# Patient Record
Sex: Female | Born: 1990 | Race: White | Hispanic: No | Smoking: Current every day smoker
Health system: Southern US, Community
[De-identification: ages and names within clinical notes are randomized; demographics above are authoritative.]

## PROBLEM LIST (undated history)

## (undated) DIAGNOSIS — J45909 Unspecified asthma, uncomplicated: Secondary | ICD-10-CM

## (undated) DIAGNOSIS — F3132 Bipolar disorder, current episode depressed, moderate: Secondary | ICD-10-CM

## (undated) DIAGNOSIS — F319 Bipolar disorder, unspecified: Secondary | ICD-10-CM

## (undated) HISTORY — DX: Bipolar disorder, current episode depressed, moderate: F31.32

## (undated) HISTORY — PX: CHOLECYSTECTOMY: SHX55

## (undated) HISTORY — PX: TONSILLECTOMY: SUR1361

---
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
## 2011-09-28 ENCOUNTER — Other Ambulatory Visit (HOSPITAL_COMMUNITY)
Admission: RE | Admit: 2011-09-28 | Discharge: 2011-09-28 | Disposition: A | Discharge status: Home / Self Care | Payer: BC Managed Care – PPO | Source: Ambulatory Visit | Attending: Endocrinology | Admitting: Endocrinology

## 2011-09-28 ENCOUNTER — Other Ambulatory Visit: Payer: Self-pay | Admitting: Family Medicine

## 2011-09-28 DIAGNOSIS — Z0000 Encounter for general adult medical examination without abnormal findings: Secondary | ICD-10-CM | POA: Insufficient documentation

## 2011-09-28 DIAGNOSIS — Z113 Encounter for screening for infections with a predominantly sexual mode of transmission: Secondary | ICD-10-CM | POA: Insufficient documentation

## 2012-02-13 ENCOUNTER — Emergency Department (HOSPITAL_COMMUNITY)
Admission: EM | Admit: 2012-02-13 | Discharge: 2012-02-13 | Disposition: A | Discharge status: Home / Self Care | Payer: BC Managed Care – PPO | Attending: Emergency Medicine | Admitting: Emergency Medicine

## 2012-02-13 ENCOUNTER — Encounter (HOSPITAL_COMMUNITY): Payer: Self-pay | Admitting: *Deleted

## 2012-02-13 DIAGNOSIS — R220 Localized swelling, mass and lump, head: Secondary | ICD-10-CM | POA: Insufficient documentation

## 2012-02-13 DIAGNOSIS — R110 Nausea: Secondary | ICD-10-CM | POA: Insufficient documentation

## 2012-02-13 DIAGNOSIS — H9209 Otalgia, unspecified ear: Secondary | ICD-10-CM | POA: Insufficient documentation

## 2012-02-13 DIAGNOSIS — J45909 Unspecified asthma, uncomplicated: Secondary | ICD-10-CM | POA: Insufficient documentation

## 2012-02-13 DIAGNOSIS — R221 Localized swelling, mass and lump, neck: Secondary | ICD-10-CM | POA: Insufficient documentation

## 2012-02-13 DIAGNOSIS — K047 Periapical abscess without sinus: Secondary | ICD-10-CM | POA: Insufficient documentation

## 2012-02-13 DIAGNOSIS — F17200 Nicotine dependence, unspecified, uncomplicated: Secondary | ICD-10-CM | POA: Insufficient documentation

## 2012-02-13 DIAGNOSIS — K029 Dental caries, unspecified: Secondary | ICD-10-CM | POA: Insufficient documentation

## 2012-02-13 HISTORY — DX: Unspecified asthma, uncomplicated: J45.909

## 2012-02-13 MED ORDER — HYDROCODONE-ACETAMINOPHEN 5-325 MG PO TABS: 1.0000 | ORAL_TABLET | ORAL | Status: DC | PRN

## 2012-02-13 MED ORDER — CLINDAMYCIN HCL 150 MG PO CAPS: ORAL_CAPSULE | ORAL | Status: DC

## 2012-02-13 MED ORDER — ONDANSETRON HCL 8 MG PO TABS: 4.0000 mg | ORAL_TABLET | Freq: Once | ORAL | Status: DC

## 2012-02-13 MED ORDER — HYDROCODONE-ACETAMINOPHEN 5-325 MG PO TABS
1.0000 | ORAL_TABLET | Freq: Once | ORAL | Status: AC
  Administered 2012-02-13: 1 via ORAL
  Filled 2012-02-13: qty 1

## 2012-02-13 MED ORDER — ONDANSETRON 4 MG PO TBDP
ORAL_TABLET | ORAL | Status: AC
  Administered 2012-02-13: 4 mg
  Filled 2012-02-13: qty 1

## 2012-02-13 NOTE — ED Notes (Signed)
States she has a tooth that needs to be pulled and an infection.

## 2012-02-13 NOTE — ED Provider Notes (Signed)
Medical screening examination/treatment/procedure(s) were performed by non-physician practitioner and as supervising physician I was immediately available for consultation/collaboration.   Charles B. Sheldon, MD 02/13/12 1531 

## 2012-02-13 NOTE — ED Provider Notes (Signed)
History     CSN: [id number]  Arrival date & time 02/13/12  1039   First MD Initiated Contact with Patient 02/13/12 1109      Chief Complaint  Patient presents with  . Dental Pain    (Consider location/radiation/quality/duration/timing/severity/associated sxs/prior treatment) The history is provided by the patient.  April James is a 22 y.o. female who presents with dental pain and swelling.  She states that the tooth itself has been rotting and painful for months, but last night, the pain increased. She was awakened by a nasty taste in her throat and noticed swelling of her left jaw.  She states that her pain has intensified over the past 10 hours.  She denies fever, sinus pain, headache.  She states that her jaw and ear hurt to the touch.  She has not tried any remedies for the pain or swelling to date.  She states that she has known for a long time that the tooth needs to be pulled, but has no money to have this done.       Past Medical History  Diagnosis Date  . Asthma     Past Surgical History  Procedure Date  . Cholecystectomy   . Tonsillectomy     No family history on file.  History  Substance Use Topics  . Smoking status: Current Every Day Smoker  . Smokeless tobacco: Not on file  . Alcohol Use: Yes     Comment: occ    OB History    Grav Para Term Preterm Abortions TAB SAB Ect Mult Living                  Review of Systems  Constitutional: Negative for fever and chills.  HENT: Positive for ear pain, facial swelling and dental problem. Negative for congestion, sore throat, trouble swallowing, neck pain, neck stiffness, voice change and sinus pressure.   Respiratory: Negative.   Cardiovascular: Negative.   Gastrointestinal: Positive for nausea.  Neurological: Negative for headaches.    Allergies  Penicillins  Home Medications  No current outpatient prescriptions on file.  BP 130/57  Pulse 93  Temp 98.2 F (36.8 C) (Oral)  Resp 22  SpO2 95%   LMP 02/10/2012  Physical Exam  Constitutional: She is oriented to person, place, and time. She appears well-developed and well-nourished.  HENT:  Head: Normocephalic and atraumatic.  Right Ear: Tympanic membrane and external ear normal.  Left Ear: Tympanic membrane and external ear normal.  Mouth/Throat: Dental abscesses and dental caries present. No oropharyngeal exudate or tonsillar abscesses.         Edema along left mandible.  Tenderness of mandible,left external ear  Eyes: Pupils are equal, round, and reactive to light.  Neck: Normal range of motion. Neck supple.  Lymphadenopathy:    She has no cervical adenopathy.  Neurological: She is alert and oriented to person, place, and time.    ED Course  Procedures (including critical care time)  Labs Reviewed - No data to display No results found.   No diagnosis found. 1. Dental abscess 2. Multiple dental caries   MDM  Patient has swelling and tenderness of left jawline due to dental abscess.  The abscess has been draining overnight.  The patient is afebrile and understands that the tooth needs to be extracted.  Antibiotics and pain medicine given along with dental referral.  Patient acknowledges and agrees with this treatment plan.        Jacobo Moncrief, PA-C 02/13/12 1156

## 2012-02-13 NOTE — ED Notes (Signed)
Pt is here with Left lower tooth decay and thinks may have abscess developing.

## 2012-05-25 ENCOUNTER — Encounter (HOSPITAL_COMMUNITY): Payer: Self-pay | Admitting: Adult Health

## 2012-05-25 DIAGNOSIS — Z880 Allergy status to penicillin: Secondary | ICD-10-CM | POA: Insufficient documentation

## 2012-05-25 DIAGNOSIS — J45909 Unspecified asthma, uncomplicated: Secondary | ICD-10-CM | POA: Insufficient documentation

## 2012-05-25 DIAGNOSIS — N10 Acute tubulo-interstitial nephritis: Secondary | ICD-10-CM | POA: Insufficient documentation

## 2012-05-25 DIAGNOSIS — R109 Unspecified abdominal pain: Secondary | ICD-10-CM | POA: Insufficient documentation

## 2012-05-25 DIAGNOSIS — R509 Fever, unspecified: Secondary | ICD-10-CM | POA: Insufficient documentation

## 2012-05-25 DIAGNOSIS — F17200 Nicotine dependence, unspecified, uncomplicated: Secondary | ICD-10-CM | POA: Insufficient documentation

## 2012-05-25 DIAGNOSIS — Z79899 Other long term (current) drug therapy: Secondary | ICD-10-CM | POA: Insufficient documentation

## 2012-05-25 DIAGNOSIS — Z3202 Encounter for pregnancy test, result negative: Secondary | ICD-10-CM | POA: Insufficient documentation

## 2012-05-25 DIAGNOSIS — Z9089 Acquired absence of other organs: Secondary | ICD-10-CM | POA: Insufficient documentation

## 2012-05-25 LAB — URINALYSIS, ROUTINE W REFLEX MICROSCOPIC
Glucose, UA: NEGATIVE mg/dL
Ketones, ur: NEGATIVE mg/dL
Nitrite: NEGATIVE
Protein, ur: 30 mg/dL — AB
Urobilinogen, UA: 0.2 mg/dL (ref 0.0–1.0)

## 2012-05-25 LAB — CBC WITH DIFFERENTIAL/PLATELET
Basophils Absolute: 0 10*3/uL (ref 0.0–0.1)
Basophils Relative: 0 % (ref 0–1)
Eosinophils Relative: 1 % (ref 0–5)
Lymphocytes Relative: 8 % — ABNORMAL LOW (ref 12–46)
MCHC: 36.2 g/dL — ABNORMAL HIGH (ref 30.0–36.0)
MCV: 93.8 fL (ref 78.0–100.0)
Platelets: 310 10*3/uL (ref 150–400)
RDW: 11.5 % (ref 11.5–15.5)
WBC: 16.9 10*3/uL — ABNORMAL HIGH (ref 4.0–10.5)

## 2012-05-25 LAB — BASIC METABOLIC PANEL
CO2: 23 mEq/L (ref 19–32)
Calcium: 9.8 mg/dL (ref 8.4–10.5)
GFR calc Af Amer: >90 mL/min (ref >90)
GFR calc non Af Amer: >90 mL/min (ref >90)
Sodium: 134 mEq/L — ABNORMAL LOW (ref 135–145)

## 2012-05-25 LAB — POCT PREGNANCY, URINE: Preg Test, Ur: NEGATIVE

## 2012-05-25 MED ORDER — ONDANSETRON 4 MG PO TBDP
8.0000 mg | ORAL_TABLET | Freq: Once | ORAL | Status: AC
  Administered 2012-05-25: 8 mg via ORAL

## 2012-05-25 MED ORDER — FENTANYL CITRATE 0.05 MG/ML IJ SOLN
50.0000 ug | Freq: Once | INTRAMUSCULAR | Status: AC
  Administered 2012-05-25: 50 ug via NASAL

## 2012-05-25 MED ORDER — ONDANSETRON 4 MG PO TBDP
ORAL_TABLET | ORAL | Status: AC
  Filled 2012-05-25: qty 2

## 2012-05-25 MED ORDER — FENTANYL CITRATE 0.05 MG/ML IJ SOLN
INTRAMUSCULAR | Status: AC
  Filled 2012-05-25: qty 2

## 2012-05-25 MED ORDER — ACETAMINOPHEN 325 MG PO TABS
650.0000 mg | ORAL_TABLET | Freq: Once | ORAL | Status: AC
  Administered 2012-05-25: 650 mg via ORAL
  Filled 2012-05-25: qty 2

## 2012-05-25 NOTE — ED Notes (Addendum)
Pt presents with frontal lobe headache associated with sensitivity to sound, nausea and generalized body pain, describes pain as pounding. Headache began this AM upon waking from sleep, tried excedrin and pepto with no relief. Pt is alert and oriented, answers all questions appropriately, denies neck pain. Afebrile.

## 2012-05-26 ENCOUNTER — Emergency Department (HOSPITAL_COMMUNITY)
Admission: EM | Admit: 2012-05-26 | Discharge: 2012-05-26 | Disposition: A | Discharge status: Home / Self Care | Payer: BC Managed Care – PPO | Attending: Emergency Medicine | Admitting: Emergency Medicine

## 2012-05-26 ENCOUNTER — Emergency Department (HOSPITAL_COMMUNITY): Payer: BC Managed Care – PPO

## 2012-05-26 DIAGNOSIS — N10 Acute tubulo-interstitial nephritis: Secondary | ICD-10-CM

## 2012-05-26 LAB — WET PREP, GENITAL

## 2012-05-26 MED ORDER — OXYCODONE-ACETAMINOPHEN 5-325 MG PO TABS: 1.0000 | ORAL_TABLET | Freq: Three times a day (TID) | ORAL | Status: DC | PRN

## 2012-05-26 MED ORDER — OXYCODONE-ACETAMINOPHEN 5-325 MG PO TABS
2.0000 | ORAL_TABLET | Freq: Once | ORAL | Status: AC
  Administered 2012-05-26: 2 via ORAL
  Filled 2012-05-26: qty 2

## 2012-05-26 MED ORDER — IOHEXOL 300 MG/ML  SOLN
20.0000 mL | INTRAMUSCULAR | Status: AC
  Administered 2012-05-26: 50 mL via ORAL

## 2012-05-26 MED ORDER — CIPROFLOXACIN IN D5W 400 MG/200ML IV SOLN
400.0000 mg | Freq: Once | INTRAVENOUS | Status: AC
  Administered 2012-05-26: 400 mg via INTRAVENOUS
  Filled 2012-05-26: qty 200

## 2012-05-26 MED ORDER — IBUPROFEN 800 MG PO TABS
800.0000 mg | ORAL_TABLET | Freq: Once | ORAL | Status: AC
  Administered 2012-05-26: 800 mg via ORAL
  Filled 2012-05-26: qty 1

## 2012-05-26 MED ORDER — FENTANYL CITRATE 0.05 MG/ML IJ SOLN
50.0000 ug | Freq: Once | INTRAMUSCULAR | Status: AC
  Administered 2012-05-26: 50 ug via INTRAVENOUS
  Filled 2012-05-26: qty 2

## 2012-05-26 MED ORDER — CIPROFLOXACIN HCL 500 MG PO TABS: ORAL_TABLET | ORAL | Status: DC

## 2012-05-26 MED ORDER — IOHEXOL 300 MG/ML  SOLN
100.0000 mL | Freq: Once | INTRAMUSCULAR | Status: AC | PRN
  Administered 2012-05-26: 100 mL via INTRAVENOUS

## 2012-05-26 MED ORDER — SODIUM CHLORIDE 0.9 % IV BOLUS (SEPSIS)
1000.0000 mL | Freq: Once | INTRAVENOUS | Status: AC
  Administered 2012-05-26: 1000 mL via INTRAVENOUS

## 2012-05-26 NOTE — ED Provider Notes (Signed)
History     CSN: [id number]  Arrival date & time 05/25/12  2046   First MD Initiated Contact with Patient 05/26/12 0049      Chief Complaint  Patient presents with  . Headache     Patient is a 22 y.o. female presenting with headaches. The history is provided by the patient and a relative.  Headache Pain location:  Frontal Quality:  Dull Onset quality:  Gradual Duration:  1 day Timing:  Constant Progression:  Worsening Chronicity:  New Relieved by:  Nothing Worsened by:  Nothing tried Associated symptoms: abdominal pain and fever   Associated symptoms: no cough and no sore throat   pt reports waking up with headache, fever, abdominal pain and myalgias No cough or sore throat No dysuria No vag bleeding reported She does report vomiting No diarrhea No rash/tick bites No focal weakness She had otherwise been well prior to this illness  Past Medical History  Diagnosis Date  . Asthma     Past Surgical History  Procedure Laterality Date  . Cholecystectomy    . Tonsillectomy      History reviewed. No pertinent family history.  History  Substance Use Topics  . Smoking status: Current Every Day Smoker  . Smokeless tobacco: Not on file  . Alcohol Use: Yes     Comment: occ    OB History   Grav Para Term Preterm Abortions TAB SAB Ect Mult Living                  Review of Systems  Constitutional: Positive for fever.  HENT: Negative for sore throat.   Respiratory: Negative for cough.   Gastrointestinal: Positive for abdominal pain.  Neurological: Positive for headaches. Negative for weakness.  All other systems reviewed and are negative.    Allergies  Penicillins and Sulfa antibiotics  Home Medications   Current Outpatient Rx  Name  Route  Sig  Dispense  Refill  . acetaminophen (TYLENOL) 500 MG tablet   Oral   Take 1,000 mg by mouth every 6 (six) hours as needed. For pain         . amphetamine-dextroamphetamine (ADDERALL XR) 30 MG 24 hr  capsule   Oral   Take 30 mg by mouth every morning.         Marland Kitchen levonorgestrel-ethinyl estradiol (ENPRESSE,TRIVORA) tablet   Oral   Take 1 tablet by mouth daily.           BP 125/69  Pulse 102  Temp(Src) 99.7 F (37.6 C) (Oral)  Resp 20  SpO2 100%  Physical Exam CONSTITUTIONAL: Well developed/well nourished HEAD: Normocephalic/atraumatic EYES: EOMI/PERRL ENMT: Mucous membranes moist, uvula midline, pharynx non-erythematous NECK: supple no meningeal signs SPINE:entire spine nontender CV: S1/S2 noted, no murmurs/rubs/gallops noted LUNGS: Lungs are clear to auscultation bilaterally, no apparent distress ABDOMEN: soft, moderate RLQ tenderness noted, no rebound or guarding GU:no cva tenderness, no cmt, no vag bleeding.  Small amt of vag discharge.  Right adnexal tenderness but no mass noted Chaperone present for entire exam NEURO: Pt is awake/alert, moves all extremitiesx4 EXTREMITIES: pulses normal, full ROM SKIN: warm, color normal PSYCH: no abnormalities of mood noted  ED Course  Procedures   Labs Reviewed  CBC WITH DIFFERENTIAL - Abnormal; Notable for the following:    WBC 16.9 (*)    MCHC 36.2 (*)    Neutrophils Relative 85 (*)    Neutro Abs 14.3 (*)    Lymphocytes Relative 8 (*)    Monocytes Absolute  1.2 (*)    All other components within normal limits  BASIC METABOLIC PANEL - Abnormal; Notable for the following:    Sodium 134 (*)    Glucose, Bld 100 (*)    All other components within normal limits  URINALYSIS, ROUTINE W REFLEX MICROSCOPIC - Abnormal; Notable for the following:    APPearance CLOUDY (*)    Hgb urine dipstick MODERATE (*)    Protein, ur 30 (*)    Leukocytes, UA SMALL (*)    All other components within normal limits  URINE MICROSCOPIC-ADD ON - Abnormal; Notable for the following:    Squamous Epithelial / LPF FEW (*)    Bacteria, UA FEW (*)    All other components within normal limits  URINE CULTURE  GC/CHLAMYDIA PROBE AMP  WET PREP,  GENITAL  POCT PREGNANCY, URINE   3:04 AM Pt initially reported HA as presenting complaint.  However she has significant RLQ abdominal tenderness on multiple re-evaluations.  Will perform CT imaging of abd/pelvis.    6:18 AM Pt found to have pyelo D/w radiology Appendix seen and normal Pt tolerating PO She feels improved SBP >100 Stable for d/c Allergy to pcn/sulfa, will start cipro for 10 days We discussed strict return precautions, pt agreeable  MDM  Nursing notes including past medical history and social history reviewed and considered in documentation Labs/vital reviewed and considered         Joya Gaskins, MD 05/26/12 [phone]

## 2012-05-27 LAB — URINE CULTURE: Colony Count: >=100000

## 2012-05-28 NOTE — ED Notes (Signed)
+   Urine Patient treated with Cipro-sensitive to same-chart appended per protocol MD. 

## 2013-03-27 ENCOUNTER — Ambulatory Visit
Admission: RE | Admit: 2013-03-27 | Discharge: 2013-03-27 | Disposition: A | Discharge status: Home / Self Care | Payer: BC Managed Care – PPO | Source: Ambulatory Visit | Attending: Family | Admitting: Family

## 2013-03-27 ENCOUNTER — Other Ambulatory Visit: Payer: Self-pay | Admitting: Family

## 2013-03-27 DIAGNOSIS — M545 Low back pain, unspecified: Secondary | ICD-10-CM

## 2013-05-30 ENCOUNTER — Encounter (HOSPITAL_COMMUNITY): Payer: Self-pay | Admitting: Emergency Medicine

## 2013-05-30 ENCOUNTER — Emergency Department (HOSPITAL_COMMUNITY): Payer: BC Managed Care – PPO

## 2013-05-30 ENCOUNTER — Emergency Department (HOSPITAL_COMMUNITY)
Admission: EM | Admit: 2013-05-30 | Discharge: 2013-05-30 | Disposition: A | Discharge status: Home / Self Care | Payer: BC Managed Care – PPO | Attending: Emergency Medicine | Admitting: Emergency Medicine

## 2013-05-30 DIAGNOSIS — Y9389 Activity, other specified: Secondary | ICD-10-CM | POA: Insufficient documentation

## 2013-05-30 DIAGNOSIS — S61209A Unspecified open wound of unspecified finger without damage to nail, initial encounter: Secondary | ICD-10-CM | POA: Insufficient documentation

## 2013-05-30 DIAGNOSIS — F17200 Nicotine dependence, unspecified, uncomplicated: Secondary | ICD-10-CM | POA: Insufficient documentation

## 2013-05-30 DIAGNOSIS — S61219A Laceration without foreign body of unspecified finger without damage to nail, initial encounter: Secondary | ICD-10-CM

## 2013-05-30 DIAGNOSIS — Z79899 Other long term (current) drug therapy: Secondary | ICD-10-CM | POA: Insufficient documentation

## 2013-05-30 DIAGNOSIS — J45909 Unspecified asthma, uncomplicated: Secondary | ICD-10-CM | POA: Insufficient documentation

## 2013-05-30 DIAGNOSIS — Y92410 Unspecified street and highway as the place of occurrence of the external cause: Secondary | ICD-10-CM | POA: Insufficient documentation

## 2013-05-30 DIAGNOSIS — Z880 Allergy status to penicillin: Secondary | ICD-10-CM | POA: Insufficient documentation

## 2013-05-30 MED ORDER — HYDROCODONE-ACETAMINOPHEN 5-325 MG PO TABS: 2.0000 | ORAL_TABLET | ORAL | Status: DC | PRN

## 2013-05-30 NOTE — Discharge Instructions (Signed)

## 2013-05-30 NOTE — ED Notes (Addendum)
Attempts were made to remove patient's ring by family members. Patient explains that they attempted to slide ring off, and cut ring with bolt cutters without success. Ring appears to be titanium and patient states she believes it is as well. Ring currently loose enough on finger to rotate easily and circulation appears intact distal to ring with brisk cap refill.
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
## 2013-05-30 NOTE — ED Notes (Signed)
Patient transported to X-ray 
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
## 2013-06-24 ENCOUNTER — Emergency Department: Payer: Self-pay | Admitting: Emergency Medicine

## 2013-06-24 LAB — URINALYSIS, COMPLETE
Bilirubin,UR: NEGATIVE
GLUCOSE, UR: NEGATIVE mg/dL (ref 0–75)
Ketone: NEGATIVE
NITRITE: NEGATIVE
Ph: 7 (ref 4.5–8.0)
Protein: 30
RBC,UR: 32 /HPF (ref 0–5)
SPECIFIC GRAVITY: 1.01 (ref 1.003–1.030)

## 2013-06-24 LAB — CBC WITH DIFFERENTIAL/PLATELET
BASOS ABS: 0.1 10*3/uL (ref 0.0–0.1)
Basophil %: 0.6 %
EOS ABS: 0.1 10*3/uL (ref 0.0–0.7)
Eosinophil %: 0.9 %
HCT: 44.5 % (ref 35.0–47.0)
HGB: 15.1 g/dL (ref 12.0–16.0)
LYMPHS PCT: 17.9 %
Lymphocyte #: 2 10*3/uL (ref 1.0–3.6)
MCH: 31.9 pg (ref 26.0–34.0)
MCHC: 34 g/dL (ref 32.0–36.0)
MCV: 94 fL (ref 80–100)
Monocyte #: 0.5 x10 3/mm (ref 0.2–0.9)
Monocyte %: 4.3 %
Neutrophil #: 8.4 10*3/uL — ABNORMAL HIGH (ref 1.4–6.5)
Neutrophil %: 76.3 %
Platelet: 421 10*3/uL (ref 150–440)
RBC: 4.74 10*6/uL (ref 3.80–5.20)
RDW: 12.4 % (ref 11.5–14.5)
WBC: 10.9 10*3/uL (ref 3.6–11.0)

## 2013-06-24 LAB — COMPREHENSIVE METABOLIC PANEL
ALK PHOS: 140 U/L — AB
ALT: 62 U/L (ref 12–78)
Albumin: 3.7 g/dL (ref 3.4–5.0)
Anion Gap: 5 — ABNORMAL LOW (ref 7–16)
BUN: 3 mg/dL — AB (ref 7–18)
Bilirubin,Total: 0.5 mg/dL (ref 0.2–1.0)
CALCIUM: 9.5 mg/dL (ref 8.5–10.1)
CO2: 28 mmol/L (ref 21–32)
Chloride: 103 mmol/L (ref 98–107)
Creatinine: 0.88 mg/dL (ref 0.60–1.30)
EGFR (African American): >60
Glucose: 118 mg/dL — ABNORMAL HIGH (ref 65–99)
Osmolality: 270 (ref 275–301)
Potassium: 3.4 mmol/L — ABNORMAL LOW (ref 3.5–5.1)
SGOT(AST): 56 U/L — ABNORMAL HIGH (ref 15–37)
Sodium: 136 mmol/L (ref 136–145)
Total Protein: 8.4 g/dL — ABNORMAL HIGH (ref 6.4–8.2)

## 2013-06-24 LAB — LIPASE, BLOOD: Lipase: 70 U/L — ABNORMAL LOW (ref 73–393)

## 2013-06-26 LAB — URINE CULTURE

## 2013-07-11 ENCOUNTER — Other Ambulatory Visit: Payer: Self-pay | Admitting: Pain Medicine

## 2013-07-11 DIAGNOSIS — M545 Low back pain, unspecified: Secondary | ICD-10-CM

## 2013-07-16 ENCOUNTER — Encounter (HOSPITAL_COMMUNITY): Payer: Self-pay | Admitting: Emergency Medicine

## 2013-07-16 DIAGNOSIS — Z3202 Encounter for pregnancy test, result negative: Secondary | ICD-10-CM | POA: Diagnosis not present

## 2013-07-16 DIAGNOSIS — J45909 Unspecified asthma, uncomplicated: Secondary | ICD-10-CM | POA: Diagnosis not present

## 2013-07-16 DIAGNOSIS — S0993XA Unspecified injury of face, initial encounter: Secondary | ICD-10-CM | POA: Diagnosis present

## 2013-07-16 DIAGNOSIS — S022XXA Fracture of nasal bones, initial encounter for closed fracture: Secondary | ICD-10-CM | POA: Insufficient documentation

## 2013-07-16 DIAGNOSIS — Z79899 Other long term (current) drug therapy: Secondary | ICD-10-CM | POA: Insufficient documentation

## 2013-07-16 DIAGNOSIS — Z880 Allergy status to penicillin: Secondary | ICD-10-CM | POA: Insufficient documentation

## 2013-07-16 DIAGNOSIS — IMO0002 Reserved for concepts with insufficient information to code with codable children: Secondary | ICD-10-CM | POA: Diagnosis not present

## 2013-07-16 DIAGNOSIS — F17200 Nicotine dependence, unspecified, uncomplicated: Secondary | ICD-10-CM | POA: Insufficient documentation

## 2013-07-16 DIAGNOSIS — S199XXA Unspecified injury of neck, initial encounter: Secondary | ICD-10-CM | POA: Diagnosis present
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
## 2013-07-17 ENCOUNTER — Emergency Department (HOSPITAL_COMMUNITY): Payer: BC Managed Care – PPO

## 2013-07-17 ENCOUNTER — Emergency Department (HOSPITAL_COMMUNITY)
Admission: EM | Admit: 2013-07-17 | Discharge: 2013-07-17 | Disposition: A | Discharge status: Home / Self Care | Payer: BC Managed Care – PPO | Attending: Emergency Medicine | Admitting: Emergency Medicine

## 2013-07-17 ENCOUNTER — Encounter (HOSPITAL_COMMUNITY): Payer: Self-pay | Admitting: Emergency Medicine

## 2013-07-17 DIAGNOSIS — S022XXA Fracture of nasal bones, initial encounter for closed fracture: Secondary | ICD-10-CM

## 2013-07-17 DIAGNOSIS — T07XXXA Unspecified multiple injuries, initial encounter: Secondary | ICD-10-CM

## 2013-07-17 LAB — POC URINE PREG, ED: Preg Test, Ur: NEGATIVE

## 2013-07-17 MED ORDER — OXYCODONE-ACETAMINOPHEN 5-325 MG PO TABS
2.0000 | ORAL_TABLET | Freq: Once | ORAL | Status: AC
  Administered 2013-07-17: 2 via ORAL
  Filled 2013-07-17: qty 2

## 2013-07-17 MED ORDER — METHOCARBAMOL 500 MG PO TABS: 500.0000 mg | ORAL_TABLET | Freq: Two times a day (BID) | ORAL | Status: DC

## 2013-07-17 MED ORDER — OXYCODONE-ACETAMINOPHEN 5-325 MG PO TABS: 1.0000 | ORAL_TABLET | Freq: Four times a day (QID) | ORAL | Status: DC | PRN
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
## 2013-07-17 NOTE — ED Notes (Signed)
CSI here to take pictures.  Will wait for another room.

## 2013-07-17 NOTE — Discharge Instructions (Signed)

## 2013-07-18 ENCOUNTER — Inpatient Hospital Stay: Admission: RE | Admit: 2013-07-18 | Payer: BC Managed Care – PPO | Source: Ambulatory Visit

## 2014-01-06 ENCOUNTER — Emergency Department (HOSPITAL_COMMUNITY)
Admission: EM | Admit: 2014-01-06 | Discharge: 2014-01-06 | Disposition: A | Discharge status: Home / Self Care | Payer: BC Managed Care – PPO | Attending: Emergency Medicine | Admitting: Emergency Medicine

## 2014-01-06 ENCOUNTER — Encounter (HOSPITAL_COMMUNITY): Payer: Self-pay | Admitting: Emergency Medicine

## 2014-01-06 ENCOUNTER — Emergency Department (HOSPITAL_COMMUNITY): Payer: BC Managed Care – PPO

## 2014-01-06 DIAGNOSIS — J45909 Unspecified asthma, uncomplicated: Secondary | ICD-10-CM | POA: Insufficient documentation

## 2014-01-06 DIAGNOSIS — W228XXA Striking against or struck by other objects, initial encounter: Secondary | ICD-10-CM | POA: Diagnosis not present

## 2014-01-06 DIAGNOSIS — Z79899 Other long term (current) drug therapy: Secondary | ICD-10-CM | POA: Diagnosis not present

## 2014-01-06 DIAGNOSIS — Z880 Allergy status to penicillin: Secondary | ICD-10-CM | POA: Insufficient documentation

## 2014-01-06 DIAGNOSIS — Z720 Tobacco use: Secondary | ICD-10-CM | POA: Insufficient documentation

## 2014-01-06 DIAGNOSIS — S99921A Unspecified injury of right foot, initial encounter: Secondary | ICD-10-CM | POA: Diagnosis not present

## 2014-01-06 DIAGNOSIS — Y9389 Activity, other specified: Secondary | ICD-10-CM | POA: Insufficient documentation

## 2014-01-06 DIAGNOSIS — M79671 Pain in right foot: Secondary | ICD-10-CM

## 2014-01-06 DIAGNOSIS — Y998 Other external cause status: Secondary | ICD-10-CM | POA: Insufficient documentation

## 2014-01-06 DIAGNOSIS — Y9289 Other specified places as the place of occurrence of the external cause: Secondary | ICD-10-CM | POA: Diagnosis not present

## 2014-01-06 MED ORDER — TRAMADOL HCL 50 MG PO TABS
50.0000 mg | ORAL_TABLET | Freq: Once | ORAL | Status: AC
  Administered 2014-01-06: 50 mg via ORAL
  Filled 2014-01-06: qty 1

## 2014-01-06 MED ORDER — TRAMADOL HCL 50 MG PO TABS: 50.0000 mg | ORAL_TABLET | Freq: Four times a day (QID) | ORAL | Status: DC | PRN
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
## 2014-01-06 NOTE — Discharge Instructions (Signed)
Foot Sprain The muscles and cord like structures which attach muscle to bone (tendons) that surround the feet are made up of units. A foot sprain can occur at the weakest spot in any of these units. This condition is most often caused by injury to or overuse of the foot, as from playing contact sports, or aggravating a previous injury, or from poor conditioning, or obesity. SYMPTOMS  Pain with movement of the foot.  Tenderness and swelling at the injury site.  Loss of strength is present in moderate or severe sprains. THE THREE GRADES OR SEVERITY OF FOOT SPRAIN ARE:  Mild (Grade I): Slightly pulled muscle without tearing of muscle or tendon fibers or loss of strength.  Moderate (Grade II): Tearing of fibers in a muscle, tendon, or at the attachment to bone, with small decrease in strength.  Severe (Grade III): Rupture of the muscle-tendon-bone attachment, with separation of fibers. Severe sprain requires surgical repair. Often repeating (chronic) sprains are caused by overuse. Sudden (acute) sprains are caused by direct injury or over-use. DIAGNOSIS  Diagnosis of this condition is usually by your own observation. If problems continue, a caregiver may be required for further evaluation and treatment. X-rays may be required to make sure there are not breaks in the bones (fractures) present. Continued problems may require physical therapy for treatment. PREVENTION  Use strength and conditioning exercises appropriate for your sport.  Warm up properly prior to working out.  Use athletic shoes that are made for the sport you are participating in.  Allow adequate time for healing. Early return to activities makes repeat injury more likely, and can lead to an unstable arthritic foot that can result in prolonged disability. Mild sprains generally heal in 3 to 10 days, with moderate and severe sprains taking 2 to 10 weeks. Your caregiver can help you determine the proper time required for  healing. HOME CARE INSTRUCTIONS   Apply ice to the injury for 15-20 minutes, 03-04 times per day. Put the ice in a plastic bag and place a towel between the bag of ice and your skin.  An elastic wrap (like an Ace bandage) may be used to keep swelling down.  Keep foot above the level of the heart, or at least raised on a footstool, when swelling and pain are present.  Try to avoid use other than gentle range of motion while the foot is painful. Do not resume use until instructed by your caregiver. Then begin use gradually, not increasing use to the point of pain. If pain does develop, decrease use and continue the above measures, gradually increasing activities that do not cause discomfort, until you gradually achieve normal use.  Use crutches if and as instructed, and for the length of time instructed.  Keep injured foot and ankle wrapped between treatments.  Massage foot and ankle for comfort and to keep swelling down. Massage from the toes up towards the knee.  Only take over-the-counter or prescription medicines for pain, discomfort, or fever as directed by your caregiver. SEEK IMMEDIATE MEDICAL CARE IF:   Your pain and swelling increase, or pain is not controlled with medications.  You have loss of feeling in your foot or your foot turns cold or blue.  You develop new, unexplained symptoms, or an increase of the symptoms that brought you to your caregiver. MAKE SURE YOU:   Understand these instructions.  Will watch your condition.  Will get help right away if you are not doing well or get worse. Document Released:   07/09/2001 Document Revised: 04/11/2011 Document Reviewed: 09/06/2007 Exit[employer] Patient Information 2015 [employer]. This information is not intended to replace advice given to you by your health care provider. Make sure you discuss any questions you have with your health care provider.  

## 2015-05-01 ENCOUNTER — Emergency Department (HOSPITAL_COMMUNITY)
Admission: EM | Admit: 2015-05-01 | Discharge: 2015-05-02 | Disposition: A | Discharge status: Other Acute Inpatient Hospital | Payer: Medicaid Other | Attending: Emergency Medicine | Admitting: Emergency Medicine

## 2015-05-01 DIAGNOSIS — F1310 Sedative, hypnotic or anxiolytic abuse, uncomplicated: Secondary | ICD-10-CM | POA: Insufficient documentation

## 2015-05-01 DIAGNOSIS — F1210 Cannabis abuse, uncomplicated: Secondary | ICD-10-CM | POA: Insufficient documentation

## 2015-05-01 DIAGNOSIS — F419 Anxiety disorder, unspecified: Secondary | ICD-10-CM | POA: Insufficient documentation

## 2015-05-01 DIAGNOSIS — F4310 Post-traumatic stress disorder, unspecified: Secondary | ICD-10-CM | POA: Insufficient documentation

## 2015-05-01 DIAGNOSIS — Z880 Allergy status to penicillin: Secondary | ICD-10-CM | POA: Insufficient documentation

## 2015-05-01 DIAGNOSIS — Z79899 Other long term (current) drug therapy: Secondary | ICD-10-CM | POA: Insufficient documentation

## 2015-05-01 DIAGNOSIS — F17210 Nicotine dependence, cigarettes, uncomplicated: Secondary | ICD-10-CM | POA: Insufficient documentation

## 2015-05-01 DIAGNOSIS — J45909 Unspecified asthma, uncomplicated: Secondary | ICD-10-CM | POA: Insufficient documentation

## 2015-05-01 DIAGNOSIS — R45851 Suicidal ideations: Secondary | ICD-10-CM

## 2015-05-01 DIAGNOSIS — F322 Major depressive disorder, single episode, severe without psychotic features: Secondary | ICD-10-CM | POA: Insufficient documentation

## 2015-05-02 ENCOUNTER — Encounter (HOSPITAL_COMMUNITY): Payer: Self-pay | Admitting: *Deleted

## 2015-05-02 ENCOUNTER — Encounter (HOSPITAL_COMMUNITY): Payer: Self-pay | Admitting: Emergency Medicine

## 2015-05-02 ENCOUNTER — Inpatient Hospital Stay (HOSPITAL_COMMUNITY)
Admission: AD | Admit: 2015-05-02 | Discharge: 2015-05-05 | DRG: 885 | Disposition: A | Discharge status: Home / Self Care | Payer: Medicaid Other | Source: Intra-hospital | Attending: Psychiatry | Admitting: Psychiatry

## 2015-05-02 DIAGNOSIS — Z8249 Family history of ischemic heart disease and other diseases of the circulatory system: Secondary | ICD-10-CM | POA: Diagnosis not present

## 2015-05-02 DIAGNOSIS — F4310 Post-traumatic stress disorder, unspecified: Secondary | ICD-10-CM | POA: Diagnosis present

## 2015-05-02 DIAGNOSIS — J45909 Unspecified asthma, uncomplicated: Secondary | ICD-10-CM | POA: Diagnosis present

## 2015-05-02 DIAGNOSIS — F3132 Bipolar disorder, current episode depressed, moderate: Secondary | ICD-10-CM | POA: Insufficient documentation

## 2015-05-02 DIAGNOSIS — F17210 Nicotine dependence, cigarettes, uncomplicated: Secondary | ICD-10-CM | POA: Diagnosis present

## 2015-05-02 DIAGNOSIS — Z915 Personal history of self-harm: Secondary | ICD-10-CM | POA: Diagnosis not present

## 2015-05-02 DIAGNOSIS — Z818 Family history of other mental and behavioral disorders: Secondary | ICD-10-CM

## 2015-05-02 DIAGNOSIS — F1010 Alcohol abuse, uncomplicated: Secondary | ICD-10-CM | POA: Diagnosis present

## 2015-05-02 DIAGNOSIS — R45851 Suicidal ideations: Secondary | ICD-10-CM | POA: Diagnosis present

## 2015-05-02 DIAGNOSIS — F1210 Cannabis abuse, uncomplicated: Secondary | ICD-10-CM | POA: Diagnosis present

## 2015-05-02 DIAGNOSIS — F319 Bipolar disorder, unspecified: Secondary | ICD-10-CM | POA: Diagnosis present

## 2015-05-02 DIAGNOSIS — F3130 Bipolar disorder, current episode depressed, mild or moderate severity, unspecified: Secondary | ICD-10-CM | POA: Clinically undetermined

## 2015-05-02 DIAGNOSIS — F322 Major depressive disorder, single episode, severe without psychotic features: Secondary | ICD-10-CM | POA: Diagnosis present

## 2015-05-02 LAB — CBC
HEMATOCRIT: 43 % (ref 36.0–46.0)
Hemoglobin: 15.2 g/dL — ABNORMAL HIGH (ref 12.0–15.0)
MCH: 32.3 pg (ref 26.0–34.0)
MCHC: 35.3 g/dL (ref 30.0–36.0)
MCV: 91.3 fL (ref 78.0–100.0)
Platelets: 426 10*3/uL — ABNORMAL HIGH (ref 150–400)
RBC: 4.71 MIL/uL (ref 3.87–5.11)
RDW: 12.3 % (ref 11.5–15.5)
WBC: 14.3 10*3/uL — ABNORMAL HIGH (ref 4.0–10.5)

## 2015-05-02 LAB — RAPID URINE DRUG SCREEN, HOSP PERFORMED
Amphetamines: NOT DETECTED
BARBITURATES: NOT DETECTED
Benzodiazepines: POSITIVE — AB
Cocaine: NOT DETECTED
Opiates: NOT DETECTED
Tetrahydrocannabinol: POSITIVE — AB

## 2015-05-02 LAB — COMPREHENSIVE METABOLIC PANEL
ALBUMIN: 4.4 g/dL (ref 3.5–5.0)
ALT: 30 U/L (ref 14–54)
AST: 22 U/L (ref 15–41)
Alkaline Phosphatase: 94 U/L (ref 38–126)
Anion gap: 11 (ref 5–15)
BUN: 8 mg/dL (ref 6–20)
CO2: 22 mmol/L (ref 22–32)
Calcium: 9.2 mg/dL (ref 8.9–10.3)
Chloride: 105 mmol/L (ref 101–111)
Creatinine, Ser: 0.61 mg/dL (ref 0.44–1.00)
GFR calc Af Amer: >60 mL/min (ref >60)
GFR calc non Af Amer: >60 mL/min (ref >60)
GLUCOSE: 102 mg/dL — AB (ref 65–99)
POTASSIUM: 3.8 mmol/L (ref 3.5–5.1)
Sodium: 138 mmol/L (ref 135–145)
Total Bilirubin: 0.6 mg/dL (ref 0.3–1.2)
Total Protein: 7.7 g/dL (ref 6.5–8.1)

## 2015-05-02 LAB — ETHANOL

## 2015-05-02 MED ORDER — ACETAMINOPHEN 325 MG PO TABS
650.0000 mg | ORAL_TABLET | ORAL | Status: DC | PRN
  Administered 2015-05-02: 650 mg via ORAL
  Filled 2015-05-02: qty 2

## 2015-05-02 MED ORDER — ONDANSETRON HCL 4 MG PO TABS
4.0000 mg | ORAL_TABLET | Freq: Three times a day (TID) | ORAL | Status: DC | PRN
  Administered 2015-05-03: 4 mg via ORAL
  Filled 2015-05-02: qty 1

## 2015-05-02 MED ORDER — LORAZEPAM 1 MG PO TABS
0.0000 mg | ORAL_TABLET | Freq: Four times a day (QID) | ORAL | Status: DC
  Administered 2015-05-02: 1 mg via ORAL
  Filled 2015-05-02: qty 1

## 2015-05-02 MED ORDER — NICOTINE 21 MG/24HR TD PT24
21.0000 mg | MEDICATED_PATCH | Freq: Every day | TRANSDERMAL | Status: DC
  Administered 2015-05-02: 21 mg via TRANSDERMAL
  Filled 2015-05-02 (×2): qty 1

## 2015-05-02 MED ORDER — TRAZODONE HCL 50 MG PO TABS
50.0000 mg | ORAL_TABLET | Freq: Every evening | ORAL | Status: DC | PRN
  Administered 2015-05-02 – 2015-05-04 (×3): 50 mg via ORAL
  Filled 2015-05-02 (×3): qty 1

## 2015-05-02 MED ORDER — ONDANSETRON HCL 4 MG PO TABS: 4.0000 mg | ORAL_TABLET | Freq: Three times a day (TID) | ORAL | Status: DC | PRN

## 2015-05-02 MED ORDER — LORAZEPAM 1 MG PO TABS
0.0000 mg | ORAL_TABLET | Freq: Four times a day (QID) | ORAL | Status: DC
  Administered 2015-05-02 – 2015-05-03 (×2): 1 mg via ORAL
  Filled 2015-05-02 (×2): qty 1

## 2015-05-02 MED ORDER — IBUPROFEN 400 MG PO TABS
400.0000 mg | ORAL_TABLET | Freq: Four times a day (QID) | ORAL | Status: DC | PRN
  Administered 2015-05-03 – 2015-05-04 (×2): 400 mg via ORAL
  Filled 2015-05-02 (×2): qty 1

## 2015-05-02 MED ORDER — NICOTINE 21 MG/24HR TD PT24
21.0000 mg | MEDICATED_PATCH | Freq: Every day | TRANSDERMAL | Status: DC
  Administered 2015-05-03 – 2015-05-05 (×3): 21 mg via TRANSDERMAL
  Filled 2015-05-02 (×5): qty 1

## 2015-05-02 MED ORDER — LORAZEPAM 1 MG PO TABS: 0.0000 mg | ORAL_TABLET | Freq: Two times a day (BID) | ORAL | Status: DC

## 2015-05-02 MED ORDER — ACETAMINOPHEN 325 MG PO TABS
650.0000 mg | ORAL_TABLET | ORAL | Status: DC | PRN
  Administered 2015-05-03: 650 mg via ORAL
  Filled 2015-05-02: qty 2

## 2015-05-02 MED ORDER — IBUPROFEN 200 MG PO TABS
400.0000 mg | ORAL_TABLET | Freq: Four times a day (QID) | ORAL | Status: DC | PRN
  Administered 2015-05-02: 400 mg via ORAL
  Filled 2015-05-02: qty 2

## 2015-05-02 NOTE — ED Notes (Signed)
Pt reports that she does have nausea/sweats/shakeys after drinking heavely
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
## 2015-05-02 NOTE — ED Notes (Signed)
[facility] contacted for transport 

## 2015-05-02 NOTE — BH Assessment (Signed)
Assessment completed. Consulted Alberteen SamFran Hobson, NP who agrees pt meets inpatient criteria. TTS to seek placement. Informed Antony MaduraKelly Humes, PA-C of the recommendation.
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
## 2015-05-02 NOTE — ED Notes (Signed)
Patient noted sleeping in room. No complaints, stable, in no acute distress. Q15 minute rounds and monitoring via Security Cameras to continue.  

## 2015-05-02 NOTE — ED Notes (Signed)
Up in hall talking w/ other pt 

## 2015-05-02 NOTE — ED Notes (Signed)
TTS into see 

## 2015-05-02 NOTE — Progress Notes (Addendum)
[facility] [place], AC.  Call report to [phone].     Maryelizabeth Rowanressa Kalai Baca, MSW, Clare CharonLCSW, LCAS [facility] Triage Specialist [phone]

## 2015-05-02 NOTE — ED Notes (Signed)
On the phone 
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
## 2015-05-02 NOTE — ED Notes (Signed)
Pt ambulatory w/o difficulty to [facility] w/ [place], belongings given to driver. 

## 2015-05-02 NOTE — ED Notes (Signed)
Pt. Transferred to [facility] from ED to room 35. [facility]. Pt. Oriented to unit including Q15 minute rounds as well as the security cameras for their protection. Patient is alert and oriented, warm and dry in no acute distress. Patient denies SI, HI, and AVH. Pt. Encouraged to let me know if needs arise.

## 2015-05-03 ENCOUNTER — Encounter (HOSPITAL_COMMUNITY): Payer: Self-pay | Admitting: Psychiatry

## 2015-05-03 DIAGNOSIS — F3130 Bipolar disorder, current episode depressed, mild or moderate severity, unspecified: Secondary | ICD-10-CM | POA: Clinically undetermined

## 2015-05-03 DIAGNOSIS — F1010 Alcohol abuse, uncomplicated: Secondary | ICD-10-CM

## 2015-05-03 DIAGNOSIS — F3132 Bipolar disorder, current episode depressed, moderate: Secondary | ICD-10-CM

## 2015-05-03 DIAGNOSIS — F1210 Cannabis abuse, uncomplicated: Secondary | ICD-10-CM | POA: Clinically undetermined

## 2015-05-03 DIAGNOSIS — R45851 Suicidal ideations: Secondary | ICD-10-CM

## 2015-05-03 HISTORY — DX: Bipolar disorder, current episode depressed, mild or moderate severity, unspecified: F31.30

## 2015-05-03 HISTORY — DX: Alcohol abuse, uncomplicated: F10.10

## 2015-05-03 LAB — TSH: TSH: 0.953 u[IU]/mL (ref 0.350–4.500)

## 2015-05-03 MED ORDER — VITAMIN B-1 100 MG PO TABS
100.0000 mg | ORAL_TABLET | Freq: Every day | ORAL | Status: DC
  Administered 2015-05-04 – 2015-05-05 (×2): 100 mg via ORAL
  Filled 2015-05-03 (×4): qty 1

## 2015-05-03 MED ORDER — LORAZEPAM 1 MG PO TABS: 1.0000 mg | ORAL_TABLET | Freq: Every day | ORAL | Status: DC

## 2015-05-03 MED ORDER — LAMOTRIGINE 25 MG PO TABS
25.0000 mg | ORAL_TABLET | Freq: Every day | ORAL | Status: DC
  Administered 2015-05-03 – 2015-05-05 (×3): 25 mg via ORAL
  Filled 2015-05-03 (×5): qty 1

## 2015-05-03 MED ORDER — LORAZEPAM 1 MG PO TABS: 1.0000 mg | ORAL_TABLET | Freq: Four times a day (QID) | ORAL | Status: DC | PRN

## 2015-05-03 MED ORDER — LORAZEPAM 1 MG PO TABS: 1.0000 mg | ORAL_TABLET | Freq: Two times a day (BID) | ORAL | Status: DC

## 2015-05-03 MED ORDER — LORAZEPAM 1 MG PO TABS: 1.0000 mg | ORAL_TABLET | Freq: Three times a day (TID) | ORAL | Status: DC

## 2015-05-03 MED ORDER — ONDANSETRON 4 MG PO TBDP: 4.0000 mg | ORAL_TABLET | Freq: Four times a day (QID) | ORAL | Status: DC | PRN

## 2015-05-03 MED ORDER — LOPERAMIDE HCL 2 MG PO CAPS: 2.0000 mg | ORAL_CAPSULE | ORAL | Status: DC | PRN

## 2015-05-03 MED ORDER — ADULT MULTIVITAMIN W/MINERALS CH
1.0000 | ORAL_TABLET | Freq: Every day | ORAL | Status: DC
  Administered 2015-05-03 – 2015-05-05 (×3): 1 via ORAL
  Filled 2015-05-03 (×6): qty 1

## 2015-05-03 MED ORDER — LORAZEPAM 1 MG PO TABS
1.0000 mg | ORAL_TABLET | Freq: Four times a day (QID) | ORAL | Status: DC
  Administered 2015-05-03: 1 mg via ORAL
  Filled 2015-05-03: qty 1

## 2015-05-03 MED ORDER — HYDROXYZINE HCL 25 MG PO TABS
25.0000 mg | ORAL_TABLET | Freq: Four times a day (QID) | ORAL | Status: DC | PRN
  Administered 2015-05-03 – 2015-05-05 (×5): 25 mg via ORAL
  Filled 2015-05-03 (×5): qty 1

## 2015-05-03 NOTE — [facility] Group Notes (Signed)
[facility] Group Notes:  (Nursing/MHT/Case Management/Adjunct)  Date:  05/03/2015  Time:  1315  Type of Therapy:  Nurse Education  /  Healthy Support Systems :  The group focuses on teaching  patients how to develop healthy support systems.  Participation Level:  Active  Participation Quality:  Appropriate  Affect:  Appropriate  Cognitive:  Alert  Insight:  Appropriate  Engagement in Group:  Engaged  Modes of Intervention:  Education  Summary of Progress/Problems:  April BraveDuke, April James April James 05/03/2015, 3:44 PM

## 2015-05-03 NOTE — Progress Notes (Signed)
D: Patient anxious and tearful tonight. Patient is passive SI but contracts for safety. Patient goal is to "get rid of the thoughts of hurting myself." Support offered. A: Support and encouragement offered. Q 15 minute checks in progress and maintained for safety. R: Patient remains safe on unit.

## 2015-05-03 NOTE — Progress Notes (Signed)
Adult Psychoeducational Group Note  Date:  05/03/2015 Time:  8:20 PM  Group Topic/Focus:  Wrap-Up Group:   The focus of this group is to help patients review their daily goal of treatment and discuss progress on daily workbooks.  Participation Level:  Active  Participation Quality:  Appropriate  Affect:  Appropriate  Cognitive:  Appropriate  Insight: Good  Engagement in Group:  Engaged  Modes of Intervention:  Discussion  Additional Comments:  Pt was pleasant. Pt rated her overall day a 10 out of 10 because she started a new medication and she can begin to feel that medication working, she had a good visit with her mother today, and she got to go outside. Pt reported that she achieved her goal for the day, which was to make new friends.   Cleotilde NeerJasmine S Jakyria Bleau 05/03/2015, 8:34 PM
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
## 2015-05-03 NOTE — Progress Notes (Signed)
Patient ID: April James, female   DOB: [birth year], 25 y.o.   MRN: [id number]  DAR: Pt. Denies SI/HI and A/V Hallucinations. She reports sleep was poor, appetite is fair, energy level is normal, and concentration is poor. She rates depression 6/10, hopelessness 8/10, and anxiety 10/10. She denies any withdrawal symptoms at this time. However is showing signs of possible withdrawal including an episode of vomiting, anxiety, and shakiness. Patient also reports a headache this morning. She received Ibuprofen but unfortunately vomited this. She was then given Zofran which provided some relief. She appears blunted in affect and depressed in mood. Support and encouragement provided to the patient. Scheduled medications administered to patient per physician's orders. Patient is cooperative, she is seen in the milieu at times. Q15 minute checks are maintained for safety.
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
## 2015-05-04 NOTE — [facility] Group Notes (Signed)
   [facility] Note  05/04/2015  8:45 AM   Participation Quality: Alert, Appropriate and Oriented  Mood/Affect: Blunted  Depression Rating: 0  Anxiety Rating: Reports mild anxiety  Thoughts of Suicide: Pt denies SI/HI  Will you contract for safety? Yes  Current AVH: Pt denies  Plan for Discharge/Comments: Pt attended discharge planning group and actively participated in group. CSW provided pt with today's workbook. Patient plans to [place] to follow up with outpatient services at [facility].   Transportation Means: Pt reports access to transportation  Supports: No supports mentioned at this time  Samuella BruinKristin Jamyron James, MSW, Johnson & JohnsonLCSW Clinical Social Worker [facility] [phone]

## 2015-05-04 NOTE — Progress Notes (Signed)
Patient ID: April James, female   DOB: [birth year], 25 y.o.   MRN: [id number]  DAR: Writer assumed care of this patient at 1100. Patient is first seen in the hallway upon first observation. She rates her depression and hopelessness 0/10. She reports sleep was good, appetite is good, energy level is normal, and concentration is good. She reports she is doing okay but she reports anxiety. She received PRN Vistaril earlier today. She states, "I'm anxious about leaving tomorrow and getting excited." She denies any other reason for the source of her anxiety at this time. She rated her anxiety 5/10 today. Reports that her day is okay so far. She is seen in the milieu and is attending some groups. Q15 minute safety checks are maintained. Plan of care is continued.
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
## 2015-05-04 NOTE — Progress Notes (Signed)
Adult Psychoeducational Group Note  Date:  05/04/2015 Time:  8:20 PM  Group Topic/Focus:  Wrap-Up Group:   The focus of this group is to help patients review their daily goal of treatment and discuss progress on daily workbooks.  Participation Level:  Active  Participation Quality:  Appropriate  Affect:  Appropriate  Cognitive:  Appropriate  Insight: Appropriate  Engagement in Group:  Engaged  Modes of Intervention:  Discussion  Additional Comments:  Pt was pleasant. Pt rated her overall day a 10 out of 10 because she learned that she is getting discharged tomorrow. Pt reported that she did not achieve her goal for the day, which was to go home, but she looks forward to achieving it tomorrow.   April James 05/04/2015, 8:53 PM

## 2015-05-04 NOTE — Progress Notes (Signed)
D: Pt was visiting with her mother upon initial approach.  Pt has anxious affect and mood.  She reports she had a good visit with her mother.  She reports her goal today is "to make 2 friends and I made it."  Pt denies SI/HI, denies hallucinations, denies pain.  Pt has been visible in milieu interacting with peers and staff appropriately.  Pt attended evening group.   A: Introduced self to pt.  Actively listened to pt and offered support and encouragement.  PRN medication administered for pain, anxiety, and sleep. R: Pt is compliant with medications.  Pt verbally contracts for safety and reports that she will inform staff of needs and concerns.  Will continue to monitor and assess.
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
## 2015-05-04 NOTE — Progress Notes (Signed)
Recreation Therapy Notes  Date: 04.03.2017 Time: 9:30am Location: [address] Dayroom   Group Topic: Stress Management  Goal Area(s) Addresses:  Patient will actively participate in stress management techniques presented during session.   Behavioral Response: Did not attend.   Nini Cavan L Livia Tarr, LRT/CTRS        Harvir Patry L 05/04/2015 3:10 PM 
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
## 2015-05-04 NOTE — Plan of Care (Signed)
Problem: Diagnosis: Increased Risk For Suicide Attempt Goal: STG-Patient Will Attend All Groups On The Unit Outcome: Progressing Pt attended evening group on 05/03/15     

## 2015-05-05 DIAGNOSIS — F3132 Bipolar disorder, current episode depressed, moderate: Secondary | ICD-10-CM | POA: Insufficient documentation

## 2015-05-05 MED ORDER — TRAZODONE HCL 50 MG PO TABS: 50.0000 mg | ORAL_TABLET | Freq: Every evening | ORAL | Status: DC | PRN

## 2015-05-05 MED ORDER — NICOTINE 21 MG/24HR TD PT24: 21.0000 mg | MEDICATED_PATCH | Freq: Every day | TRANSDERMAL | Status: DC

## 2015-05-05 MED ORDER — ACETAMINOPHEN 500 MG PO TABS: 1000.0000 mg | ORAL_TABLET | Freq: Four times a day (QID) | ORAL | Status: DC | PRN

## 2015-05-05 MED ORDER — LAMOTRIGINE 25 MG PO TABS: 25.0000 mg | ORAL_TABLET | Freq: Every day | ORAL | Status: DC
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
## 2015-05-05 NOTE — Progress Notes (Signed)
Recreation Therapy Notes  Animal-Assisted Activity (AAA) Program Checklist/Progress Notes Patient Eligibility Criteria Checklist & Daily Group note for Rec Tx Intervention  Date: 04.04.2017 Time: 2:45pm Location: [address] Dayroom    AAA/T Program Assumption of Risk Form signed by Patient/ or Parent Legal Guardian Yes  Patient is free of allergies or sever asthma Yes  Patient reports no fear of animals Yes  Patient reports no history of cruelty to animals Yes  Patient understands his/her participation is voluntary Yes  Behavioral Response: Did not attend.     Nollan Muldrow L Zavia Pullen, LRT/CTRS        Shuayb Schepers L 05/05/2015 3:12 PM 

## 2015-05-05 NOTE — Progress Notes (Signed)
D: Pt has anxious affect and mood.  Pt reports she had a "good" day and that she is "excited because I'm leaving tomorrow.  I can't wait to see my son."  She reports she feels safe to discharge tomorrow and that she plans to follow up with a psychiatrist.  Pt denies SI/HI, denies hallucinations, denies pain.  Pt has been visible in milieu interacting with peers and staff appropriately.  Pt attended evening group.   A: Actively listened to pt and offered support and encouragement.  PRN medication administered for sleep and anxiety.  R: Pt is compliant with medications.  Pt verbally contracts for safety.  Will continue to monitor and assess.
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
## 2015-05-05 NOTE — Progress Notes (Signed)
Discharge note:  Patient discharged home per MD order.  Patient received all personal belongings from locker and unit. Reviewed discharge instructions and follow up appointments with patient and she indicated understanding.  Patient denies SI/HI/AVH. Patient received prescriptions.  Patient left ambulatory with her mother.

## 2015-05-05 NOTE — [facility] Suicide Risk Assessment (Signed)
[facility] INPATIENT:  Family/Significant Other Suicide Prevention Education  Suicide Prevention Education:  Education Completed; mother Julieanne MansonKellie Faucette [phone],  (name of family member/significant other) has been identified by the patient as the family member/significant other with whom the patient will be residing, and identified as the person(s) who will aid the patient in the event of a mental health crisis (suicidal ideations/suicide attempt).  With written consent from the patient, the family member/significant other has been provided the following suicide prevention education, prior to the and/or following the discharge of the patient.  The suicide prevention education provided includes the following:  Suicide risk factors  Suicide prevention and interventions  National Suicide Hotline telephone number  [facility] assessment telephone number  [facility] Emergency Assistance 911  [facility] and/or Residential Mobile Crisis Unit telephone number  Request made of family/significant other to:  Remove weapons (e.g., guns, rifles, knives), all items previously/currently identified as safety concern.    Remove drugs/medications (over-the-counter, prescriptions, illicit drugs), all items previously/currently identified as a safety concern.  The family member/significant other verbalizes understanding of the suicide prevention education information provided.  The family member/significant other agrees to remove the items of safety concern listed above.  April James, [place]ristin L James, April James

## 2015-05-05 NOTE — Progress Notes (Signed)
  [facility] :  Will you be returning to the same living situation after discharge:  Yes,  patient plans to return home with mother                    At discharge, do you have transportation home?: Yes,  mother will transport Do you have the ability to pay for your medications: Yes,  patient will be provided with prescriptions at discharge  Release of information consent forms completed and in the chart;  Patient's signature needed at discharge.  Patient to Follow up at: Follow-up Information    Call [facility].   Why:  Please call office at discharge to schedule new patient appointments for therapy and medication management services. $150 Deposit is required to schedule initial appointment.   Contact information:   [address] [place]  [address] Telephone:  [phone]      Next level of care provider has access to [facility]: No  Safety Planning and Suicide Prevention discussed: Yes,  with patient and mother     Has patient been referred to the Quitline?: Patient refused referral  Patient has been referred for addiction treatment: Yes  Kristy Catoe, [place]boKristin L 05/05/2015, 11:02 AM

## 2015-05-05 NOTE — Plan of Care (Signed)
Problem: Alteration in mood & ability to function due to Goal: LTG-Pt reports reduction in suicidal thoughts (Patient reports reduction in suicidal thoughts and is able to verbalize a safety plan for whenever patient is feeling suicidal)  Outcome: Progressing Pt denies SI and verbally contracts for safety.       

## 2015-06-15 ENCOUNTER — Emergency Department (HOSPITAL_COMMUNITY)
Admission: EM | Admit: 2015-06-15 | Discharge: 2015-06-16 | Disposition: A | Discharge status: Home / Self Care | Payer: Medicaid Other | Attending: Emergency Medicine | Admitting: Emergency Medicine

## 2015-06-15 ENCOUNTER — Encounter (HOSPITAL_COMMUNITY): Payer: Self-pay | Admitting: Family Medicine

## 2015-06-15 DIAGNOSIS — Y999 Unspecified external cause status: Secondary | ICD-10-CM | POA: Diagnosis not present

## 2015-06-15 DIAGNOSIS — Y939 Activity, unspecified: Secondary | ICD-10-CM | POA: Insufficient documentation

## 2015-06-15 DIAGNOSIS — L02211 Cutaneous abscess of abdominal wall: Secondary | ICD-10-CM | POA: Diagnosis not present

## 2015-06-15 DIAGNOSIS — Y929 Unspecified place or not applicable: Secondary | ICD-10-CM | POA: Diagnosis not present

## 2015-06-15 DIAGNOSIS — W57XXXA Bitten or stung by nonvenomous insect and other nonvenomous arthropods, initial encounter: Secondary | ICD-10-CM | POA: Insufficient documentation

## 2015-06-15 DIAGNOSIS — J45909 Unspecified asthma, uncomplicated: Secondary | ICD-10-CM | POA: Insufficient documentation

## 2015-06-15 DIAGNOSIS — L02212 Cutaneous abscess of back [any part, except buttock]: Secondary | ICD-10-CM

## 2015-06-15 DIAGNOSIS — F17210 Nicotine dependence, cigarettes, uncomplicated: Secondary | ICD-10-CM | POA: Diagnosis not present

## 2015-06-15 DIAGNOSIS — S30861A Insect bite (nonvenomous) of abdominal wall, initial encounter: Secondary | ICD-10-CM | POA: Insufficient documentation

## 2015-06-15 DIAGNOSIS — Z791 Long term (current) use of non-steroidal anti-inflammatories (NSAID): Secondary | ICD-10-CM | POA: Insufficient documentation

## 2015-06-15 DIAGNOSIS — Z79899 Other long term (current) drug therapy: Secondary | ICD-10-CM | POA: Insufficient documentation

## 2015-06-15 MED ORDER — DOXYCYCLINE HYCLATE 100 MG PO CAPS: 100.0000 mg | ORAL_CAPSULE | Freq: Two times a day (BID) | ORAL | Status: DC

## 2015-06-15 MED ORDER — LIDOCAINE-EPINEPHRINE (PF) 2 %-1:200000 IJ SOLN
20.0000 mL | Freq: Once | INTRAMUSCULAR | Status: AC
  Administered 2015-06-15: 20 mL
  Filled 2015-06-15: qty 20

## 2015-06-15 MED ORDER — OXYCODONE-ACETAMINOPHEN 5-325 MG PO TABS: 1.0000 | ORAL_TABLET | ORAL | Status: DC | PRN

## 2015-06-15 MED ORDER — OXYCODONE-ACETAMINOPHEN 5-325 MG PO TABS
1.0000 | ORAL_TABLET | Freq: Once | ORAL | Status: AC
Start: 2015-06-15 — End: 2015-06-15
  Administered 2015-06-15: 1 via ORAL
  Filled 2015-06-15: qty 1

## 2015-06-15 NOTE — ED Provider Notes (Signed)
CSN: [id number]     Arrival date & time 06/15/15  2124 History  By signing my name below, I, Linus Galas, attest that this documentation has been prepared under the direction and in the presence of Elpidio Anis, PA-C. Electronically Signed: Linus Galas, ED Scribe. 06/15/2015. 10:34 PM.   Chief Complaint  Patient presents with  . Insect Bite   The history is provided by the patient. No language interpreter was used.   HPI Comments: April James is a 25 y.o. female who presents to the Emergency Department complaining of an insect bite to her left flank 5 days ago. Since then she has had an abscess with drainage although there is no active drainage in the ED. Pt denies any fevers, chills, nausea, vomiting, or any other symptoms at this time. Pt is allergic to Penicillin.   Past Medical History  Diagnosis Date  . Asthma    Past Surgical History  Procedure Laterality Date  . Cholecystectomy    . Tonsillectomy     Family History  Problem Relation Age of Onset  . Bipolar disorder Father   . Heart disease Father   . Aneurysm Father    Social History  Substance Use Topics  . Smoking status: Current Every Day Smoker -- 1.00 packs/day for 8 years    Types: Cigarettes  . Smokeless tobacco: None  . Alcohol Use: Yes     Comment: Every couple of weeks.    OB History    No data available     Review of Systems  Constitutional: Negative for fever and chills.  Gastrointestinal: Negative for nausea and vomiting.  Skin: Positive for wound (absceses with no active drainage).    Allergies  Penicillins and Sulfa antibiotics  Home Medications   Prior to Admission medications   Medication Sig Start Date End Date Taking? Authorizing Provider  ibuprofen (ADVIL,MOTRIN) 200 MG tablet Take 400 mg by mouth every 6 (six) hours as needed for headache, mild pain, moderate pain or cramping.   Yes Historical Provider, MD  acetaminophen (TYLENOL) 500 MG tablet Take 2 tablets (1,000 mg total)  by mouth every 6 (six) hours as needed for mild pain, moderate pain or fever. For pain Patient not taking: Reported on 06/15/2015 05/05/15   Sanjuana Kava, NP  lamoTRIgine (LAMICTAL) 25 MG tablet Take 1 tablet (25 mg total) by mouth daily. For mood stabilization Patient not taking: Reported on 06/15/2015 05/05/15   Sanjuana Kava, NP  nicotine (NICODERM CQ - DOSED IN MG/24 HOURS) 21 mg/24hr patch Place 1 patch (21 mg total) onto the skin daily. For smoking cessation Patient not taking: Reported on 06/15/2015 05/05/15   Sanjuana Kava, NP  traZODone (DESYREL) 50 MG tablet Take 1 tablet (50 mg total) by mouth at bedtime as needed for sleep. Patient not taking: Reported on 06/15/2015 05/05/15   Sanjuana Kava, NP   BP 103/61 mmHg  Pulse 69  Temp(Src) 98.3 F (36.8 C) (Oral)  Resp 18  Ht  (1.575 m)  Wt 135 lb (61.236 kg)  BMI 24.69 kg/m2  SpO2 98%  LMP 06/13/2015   Physical Exam  Constitutional: She is oriented to person, place, and time. She appears well-developed and well-nourished.  HENT:  Head: Normocephalic and atraumatic.  Cardiovascular: Normal rate.   Pulmonary/Chest: Effort normal.  Abdominal:  Left flank area has an indurated tender area measuring 10 cm x 4 cm with an erythematous, scabbed ulceration at the posterior border; no active drainage. Findings consistent with  subcutaneous abscess.   Neurological: She is alert and oriented to person, place, and time.  Skin: Skin is warm and dry.  Psychiatric: She has a normal mood and affect.  Nursing note and vitals reviewed.  ED Course  Procedures  INCISION AND DRAINAGE PROCEDURE NOTE: Patient identification was confirmed and verbal consent was obtained. This procedure was performed by Elpidio AnisShari Darrall Strey, PA-C at 10:47 PM. Site: left flank Sterile procedures observed yes Needle size: 25 G Anesthetic used (type and amt): 2% lido w/epi, 4 cc Blade size: #11 Drainage: minimal Complexity: Complex Packing used1/2 in iodoform  Site  anesthetized, incision made over site, wound drained and explored loculations, rinsed with copious amounts of normal saline, wound packed with sterile gauze, covered with dry, sterile dressing.  Pt tolerated procedure well without complications.  Instructions for care discussed verbally and pt provided with additional written instructions for homecare and f/u.  DIAGNOSTIC STUDIES: Oxygen Saturation is 98% on room air, normal by my interpretation.    COORDINATION OF CARE: 10:32 PM Will give pain medication and preform I&D. Discussed treatment plan with pt at bedside and pt agreed to plan.  Labs Review Labs Reviewed - No data to display  Imaging Review No results found. I have personally reviewed and evaluated these images and lab results as part of my medical decision-making.   EKG Interpretation None      MDM   Final diagnoses:  None    1. Cutaneous abscess  Area of induration I&D'd with minimal drainage, per above note. Tolerated procedure well. Instructions to return in 2 days for recheck and packing removal. Will start abx, provide pain management.   I personally performed the services described in this documentation, which was scribed in my presence. The recorded information has been reviewed and is accurate.    Elpidio AnisShari Brice Kossman, PA-C 06/15/15 2354  Doug SouSam Jacubowitz, MD 06/16/15 [phone]

## 2015-06-15 NOTE — Discharge Instructions (Signed)

## 2015-06-15 NOTE — ED Notes (Signed)
Patient reports she possibly could have been bit by an insect to her left flank area. Pt does have core area that is dark in area with redness around the sight. Reports it drained white to yellow/green thick drainage. Denies fever. Took Ibuprofen about 6-7 hours ago.

## 2015-06-17 ENCOUNTER — Encounter (HOSPITAL_COMMUNITY): Payer: Self-pay | Admitting: *Deleted

## 2015-06-17 ENCOUNTER — Emergency Department (HOSPITAL_COMMUNITY)
Admission: EM | Admit: 2015-06-17 | Discharge: 2015-06-17 | Disposition: A | Discharge status: Home / Self Care | Payer: Medicaid Other | Attending: Emergency Medicine | Admitting: Emergency Medicine

## 2015-06-17 DIAGNOSIS — Z09 Encounter for follow-up examination after completed treatment for conditions other than malignant neoplasm: Secondary | ICD-10-CM

## 2015-06-17 DIAGNOSIS — Z4800 Encounter for change or removal of nonsurgical wound dressing: Secondary | ICD-10-CM | POA: Diagnosis not present

## 2015-06-17 DIAGNOSIS — J45909 Unspecified asthma, uncomplicated: Secondary | ICD-10-CM | POA: Insufficient documentation

## 2015-06-17 DIAGNOSIS — F17210 Nicotine dependence, cigarettes, uncomplicated: Secondary | ICD-10-CM | POA: Insufficient documentation

## 2015-06-17 MED ORDER — HYDROCODONE-ACETAMINOPHEN 5-325 MG PO TABS: 1.0000 | ORAL_TABLET | Freq: Four times a day (QID) | ORAL | Status: DC | PRN

## 2015-06-17 MED ORDER — IBUPROFEN 800 MG PO TABS: 800.0000 mg | ORAL_TABLET | Freq: Three times a day (TID) | ORAL | Status: DC | PRN

## 2015-06-17 NOTE — ED Notes (Addendum)
Pt here for recheck of abscess that was drained on her left flank on 5/15. Pt states she has noticed minimal drainage today.

## 2015-06-17 NOTE — Discharge Instructions (Signed)
Return here as needed. Follow up with a PCP. Use warm compresses around the area and warm bath soaks

## 2015-06-17 NOTE — ED Provider Notes (Signed)
CSN: [id number]     Arrival date & time 06/17/15  1411 History  By signing my name below, I, April James, attest that this documentation has been prepared under the direction and in the presence of non-physician practitioner, Ebbie Ridgehris Mercer Peifer, PA-C . Electronically Signed: Linna Darnerussell James, Scribe. 06/17/2015. 2:57 PM.   Chief Complaint  Patient presents with  . Wound Check    The history is provided by the patient. No language interpreter was used.     HPI Comments: April James is a 25 y.o. female who presents to the Emergency Department requesting recheck of left flank abscess that was drained in the ER two days ago. She noticed the abscess one week ago after sustaining an insect bite to her left flank. She reports that her abscess has been healing well. She notes that she has experienced mild drainage from the abscess since her I&D. She was discharged with Doxycycline and has been taking it regularly. She denies fever, chills, or any other associated symptoms. She does not have a PCP.  Past Medical History  Diagnosis Date  . Asthma    Past Surgical History  Procedure Laterality Date  . Cholecystectomy    . Tonsillectomy     Family History  Problem Relation Age of Onset  . Bipolar disorder Father   . Heart disease Father   . Aneurysm Father    Social History  Substance Use Topics  . Smoking status: Current Every Day Smoker -- 1.00 packs/day for 8 years    Types: Cigarettes  . Smokeless tobacco: None  . Alcohol Use: Yes     Comment: Every couple of weeks.    OB History    No data available     Review of Systems  All other systems negative except as documented in the HPI. All pertinent positives and negatives as reviewed in the HPI.  Allergies  Penicillins and Sulfa antibiotics  Home Medications   Prior to Admission medications   Medication Sig Start Date End Date Taking? Authorizing Provider  acetaminophen (TYLENOL) 500 MG tablet Take 2 tablets (1,000 mg total) by  mouth every 6 (six) hours as needed for mild pain, moderate pain or fever. For pain Patient not taking: Reported on 06/15/2015 05/05/15   Sanjuana KavaAgnes I Nwoko, NP  doxycycline (VIBRAMYCIN) 100 MG capsule Take 1 capsule (100 mg total) by mouth 2 (two) times daily. 06/15/15   Elpidio AnisShari Upstill, PA-C  ibuprofen (ADVIL,MOTRIN) 200 MG tablet Take 400 mg by mouth every 6 (six) hours as needed for headache, mild pain, moderate pain or cramping.    Historical Provider, MD  lamoTRIgine (LAMICTAL) 25 MG tablet Take 1 tablet (25 mg total) by mouth daily. For mood stabilization Patient not taking: Reported on 06/15/2015 05/05/15   Sanjuana KavaAgnes I Nwoko, NP  nicotine (NICODERM CQ - DOSED IN MG/24 HOURS) 21 mg/24hr patch Place 1 patch (21 mg total) onto the skin daily. For smoking cessation Patient not taking: Reported on 06/15/2015 05/05/15   Sanjuana KavaAgnes I Nwoko, NP  oxyCODONE-acetaminophen (PERCOCET/ROXICET) 5-325 MG tablet Take 1 tablet by mouth every 4 (four) hours as needed for severe pain. 06/15/15   Elpidio AnisShari Upstill, PA-C  traZODone (DESYREL) 50 MG tablet Take 1 tablet (50 mg total) by mouth at bedtime as needed for sleep. Patient not taking: Reported on 06/15/2015 05/05/15   Sanjuana KavaAgnes I Nwoko, NP   BP 106/61 mmHg  Pulse 60  Temp(Src) 98 F (36.7 C) (Oral)  Resp 18  SpO2 100%  LMP 06/13/2015 Physical Exam  Constitutional: She is oriented to person, place, and time. She appears well-developed and well-nourished. No distress.  HENT:  Head: Normocephalic and atraumatic.  Pulmonary/Chest: Effort normal. No respiratory distress.  Neurological: She is alert and oriented to person, place, and time.  Skin: Skin is warm and dry. She is not diaphoretic.  Well healing abscess on the left posterior flank, no surrounding erythema or warmth.    ED Course  Procedures (including critical care time)  DIAGNOSTIC STUDIES: Oxygen Saturation is 100% on RA, normal by my interpretation.    COORDINATION OF CARE: 2:57 PM Discussed treatment plan with pt at  bedside and pt agreed to plan.  Labs Review Labs Reviewed - No data to display  Imaging Review No results found. I have personally reviewed and evaluated these images and lab results as part of my medical decision-making.     Charlestine Night, PA-C 06/17/15 2203  Mancel Bale, MD 06/19/15 2051

## 2015-08-15 ENCOUNTER — Encounter (HOSPITAL_COMMUNITY): Payer: Self-pay | Admitting: Emergency Medicine

## 2015-08-15 ENCOUNTER — Emergency Department (HOSPITAL_COMMUNITY): Payer: No Typology Code available for payment source

## 2015-08-15 ENCOUNTER — Emergency Department (HOSPITAL_COMMUNITY)
Admission: EM | Admit: 2015-08-15 | Discharge: 2015-08-15 | Disposition: A | Discharge status: Home / Self Care | Payer: No Typology Code available for payment source | Attending: Emergency Medicine | Admitting: Emergency Medicine

## 2015-08-15 DIAGNOSIS — Y92410 Unspecified street and highway as the place of occurrence of the external cause: Secondary | ICD-10-CM | POA: Insufficient documentation

## 2015-08-15 DIAGNOSIS — T148 Other injury of unspecified body region: Secondary | ICD-10-CM | POA: Diagnosis present

## 2015-08-15 DIAGNOSIS — R1084 Generalized abdominal pain: Secondary | ICD-10-CM | POA: Diagnosis not present

## 2015-08-15 DIAGNOSIS — M542 Cervicalgia: Secondary | ICD-10-CM | POA: Insufficient documentation

## 2015-08-15 DIAGNOSIS — Y939 Activity, unspecified: Secondary | ICD-10-CM | POA: Insufficient documentation

## 2015-08-15 DIAGNOSIS — S301XXA Contusion of abdominal wall, initial encounter: Secondary | ICD-10-CM | POA: Insufficient documentation

## 2015-08-15 DIAGNOSIS — Y999 Unspecified external cause status: Secondary | ICD-10-CM | POA: Diagnosis not present

## 2015-08-15 DIAGNOSIS — S50312A Abrasion of left elbow, initial encounter: Secondary | ICD-10-CM | POA: Diagnosis not present

## 2015-08-15 DIAGNOSIS — S7011XA Contusion of right thigh, initial encounter: Secondary | ICD-10-CM | POA: Diagnosis not present

## 2015-08-15 DIAGNOSIS — Z79899 Other long term (current) drug therapy: Secondary | ICD-10-CM | POA: Diagnosis not present

## 2015-08-15 DIAGNOSIS — J45909 Unspecified asthma, uncomplicated: Secondary | ICD-10-CM | POA: Insufficient documentation

## 2015-08-15 DIAGNOSIS — R55 Syncope and collapse: Secondary | ICD-10-CM | POA: Diagnosis not present

## 2015-08-15 DIAGNOSIS — F17210 Nicotine dependence, cigarettes, uncomplicated: Secondary | ICD-10-CM | POA: Diagnosis not present

## 2015-08-15 DIAGNOSIS — S50812A Abrasion of left forearm, initial encounter: Secondary | ICD-10-CM | POA: Diagnosis not present

## 2015-08-15 LAB — CBC WITH DIFFERENTIAL/PLATELET
BASOS ABS: 0 10*3/uL (ref 0.0–0.1)
Basophils Relative: 0 %
EOS ABS: 0.1 10*3/uL (ref 0.0–0.7)
EOS PCT: 1 %
HCT: 41.4 % (ref 36.0–46.0)
Hemoglobin: 13.9 g/dL (ref 12.0–15.0)
Lymphocytes Relative: 23 %
Lymphs Abs: 2.6 10*3/uL (ref 0.7–4.0)
MCH: 31.9 pg (ref 26.0–34.0)
MCHC: 33.6 g/dL (ref 30.0–36.0)
MCV: 95 fL (ref 78.0–100.0)
Monocytes Absolute: 0.7 10*3/uL (ref 0.1–1.0)
Monocytes Relative: 7 %
Neutro Abs: 7.8 10*3/uL — ABNORMAL HIGH (ref 1.7–7.7)
Neutrophils Relative %: 69 %
PLATELETS: 334 10*3/uL (ref 150–400)
RBC: 4.36 MIL/uL (ref 3.87–5.11)
RDW: 12.8 % (ref 11.5–15.5)
WBC: 11.3 10*3/uL — AB (ref 4.0–10.5)

## 2015-08-15 LAB — BASIC METABOLIC PANEL
ANION GAP: 7 (ref 5–15)
BUN: 9 mg/dL (ref 6–20)
CALCIUM: 9.5 mg/dL (ref 8.9–10.3)
CO2: 26 mmol/L (ref 22–32)
Chloride: 109 mmol/L (ref 101–111)
Creatinine, Ser: 0.71 mg/dL (ref 0.44–1.00)
GFR calc Af Amer: >60 mL/min (ref >60)
Glucose, Bld: 116 mg/dL — ABNORMAL HIGH (ref 65–99)
POTASSIUM: 3.3 mmol/L — AB (ref 3.5–5.1)
SODIUM: 142 mmol/L (ref 135–145)

## 2015-08-15 LAB — HCG, SERUM, QUALITATIVE: PREG SERUM: NEGATIVE

## 2015-08-15 MED ORDER — MORPHINE SULFATE (PF) 4 MG/ML IV SOLN
4.0000 mg | Freq: Once | INTRAVENOUS | Status: AC
  Administered 2015-08-15: 4 mg via INTRAVENOUS
  Filled 2015-08-15: qty 1

## 2015-08-15 MED ORDER — ONDANSETRON HCL 4 MG/2ML IJ SOLN
4.0000 mg | Freq: Once | INTRAMUSCULAR | Status: AC
  Administered 2015-08-15: 4 mg via INTRAVENOUS
  Filled 2015-08-15: qty 2

## 2015-08-15 MED ORDER — IOPAMIDOL (ISOVUE-300) INJECTION 61%
INTRAVENOUS | Status: AC
  Administered 2015-08-15: 100 mL
  Filled 2015-08-15: qty 100
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
## 2016-09-11 ENCOUNTER — Encounter (HOSPITAL_BASED_OUTPATIENT_CLINIC_OR_DEPARTMENT_OTHER): Payer: Self-pay | Admitting: Emergency Medicine

## 2016-09-11 ENCOUNTER — Emergency Department (HOSPITAL_BASED_OUTPATIENT_CLINIC_OR_DEPARTMENT_OTHER)
Admission: EM | Admit: 2016-09-11 | Discharge: 2016-09-11 | Disposition: A | Discharge status: Home / Self Care | Payer: PRIVATE HEALTH INSURANCE | Attending: Emergency Medicine | Admitting: Emergency Medicine

## 2016-09-11 ENCOUNTER — Emergency Department (HOSPITAL_BASED_OUTPATIENT_CLINIC_OR_DEPARTMENT_OTHER): Payer: PRIVATE HEALTH INSURANCE

## 2016-09-11 DIAGNOSIS — N939 Abnormal uterine and vaginal bleeding, unspecified: Secondary | ICD-10-CM | POA: Insufficient documentation

## 2016-09-11 DIAGNOSIS — Z79899 Other long term (current) drug therapy: Secondary | ICD-10-CM | POA: Insufficient documentation

## 2016-09-11 DIAGNOSIS — F17210 Nicotine dependence, cigarettes, uncomplicated: Secondary | ICD-10-CM | POA: Insufficient documentation

## 2016-09-11 DIAGNOSIS — R109 Unspecified abdominal pain: Secondary | ICD-10-CM | POA: Insufficient documentation

## 2016-09-11 DIAGNOSIS — R112 Nausea with vomiting, unspecified: Secondary | ICD-10-CM | POA: Insufficient documentation

## 2016-09-11 DIAGNOSIS — J45909 Unspecified asthma, uncomplicated: Secondary | ICD-10-CM | POA: Insufficient documentation

## 2016-09-11 HISTORY — DX: Bipolar disorder, unspecified: F31.9

## 2016-09-11 LAB — URINALYSIS, ROUTINE W REFLEX MICROSCOPIC
GLUCOSE, UA: NEGATIVE mg/dL
Ketones, ur: 15 mg/dL — AB
Nitrite: NEGATIVE
PROTEIN: 100 mg/dL — AB
Specific Gravity, Urine: 1.037 — ABNORMAL HIGH (ref 1.005–1.030)
pH: 5.5 (ref 5.0–8.0)

## 2016-09-11 LAB — COMPREHENSIVE METABOLIC PANEL
ALBUMIN: 3.6 g/dL (ref 3.5–5.0)
ALT: 38 U/L (ref 14–54)
AST: 25 U/L (ref 15–41)
Alkaline Phosphatase: 134 U/L — ABNORMAL HIGH (ref 38–126)
Anion gap: 14 (ref 5–15)
BUN: 13 mg/dL (ref 6–20)
CHLORIDE: 98 mmol/L — AB (ref 101–111)
CO2: 25 mmol/L (ref 22–32)
CREATININE: 0.6 mg/dL (ref 0.44–1.00)
Calcium: 9.4 mg/dL (ref 8.9–10.3)
GFR calc non Af Amer: >60 mL/min (ref >60)
Glucose, Bld: 128 mg/dL — ABNORMAL HIGH (ref 65–99)
Potassium: 3 mmol/L — ABNORMAL LOW (ref 3.5–5.1)
SODIUM: 137 mmol/L (ref 135–145)
Total Bilirubin: 0.6 mg/dL (ref 0.3–1.2)
Total Protein: 8.1 g/dL (ref 6.5–8.1)

## 2016-09-11 LAB — CBC
HCT: 40.6 % (ref 36.0–46.0)
Hemoglobin: 13.9 g/dL (ref 12.0–15.0)
MCH: 31.7 pg (ref 26.0–34.0)
MCHC: 34.2 g/dL (ref 30.0–36.0)
MCV: 92.5 fL (ref 78.0–100.0)
PLATELETS: 441 10*3/uL — AB (ref 150–400)
RBC: 4.39 MIL/uL (ref 3.87–5.11)
RDW: 12.6 % (ref 11.5–15.5)
WBC: 17.8 10*3/uL — AB (ref 4.0–10.5)

## 2016-09-11 LAB — URINALYSIS, MICROSCOPIC (REFLEX)

## 2016-09-11 LAB — PREGNANCY, URINE: PREG TEST UR: NEGATIVE

## 2016-09-11 LAB — LIPASE, BLOOD: Lipase: 19 U/L (ref 11–51)

## 2016-09-11 MED ORDER — ONDANSETRON 8 MG PO TBDP: 8.0000 mg | ORAL_TABLET | Freq: Three times a day (TID) | ORAL | 0 refills | Status: DC | PRN

## 2016-09-11 MED ORDER — IOPAMIDOL (ISOVUE-300) INJECTION 61%
100.0000 mL | Freq: Once | INTRAVENOUS | Status: AC | PRN
  Administered 2016-09-11: 100 mL via INTRAVENOUS

## 2016-09-11 MED ORDER — SODIUM CHLORIDE 0.9 % IV SOLN
1000.0000 mL | INTRAVENOUS | Status: DC
  Administered 2016-09-11: 1000 mL via INTRAVENOUS

## 2016-09-11 MED ORDER — SODIUM CHLORIDE 0.9 % IV BOLUS (SEPSIS)
1000.0000 mL | Freq: Once | INTRAVENOUS | Status: AC
  Administered 2016-09-11: 1000 mL via INTRAVENOUS

## 2016-09-11 MED ORDER — MORPHINE SULFATE (PF) 4 MG/ML IV SOLN
4.0000 mg | Freq: Once | INTRAVENOUS | Status: AC
  Administered 2016-09-11: 4 mg via INTRAVENOUS
  Filled 2016-09-11: qty 1

## 2016-09-11 MED ORDER — ONDANSETRON HCL 4 MG/2ML IJ SOLN
4.0000 mg | Freq: Once | INTRAMUSCULAR | Status: AC
  Administered 2016-09-11: 4 mg via INTRAVENOUS
  Filled 2016-09-11: qty 2

## 2016-09-11 NOTE — ED Triage Notes (Signed)
Patient states that she has had abdominal pain x 3 weeks. She has had intermittent vaginal bleeding with the pain. REports that she started to have vomiting today and is unable to eat or drink. Patient is dry heaving in triage

## 2016-09-11 NOTE — ED Provider Notes (Signed)
[facility] Provider Note   CSN: [id number] Arrival date & time: 09/11/16  0018     History   Chief Complaint Chief Complaint  Patient presents with  . Abdominal Pain    HPI [place] O[place]ling is a 26 y.o. female.  HPI Patient is a 26 year old female presents the emergency department with diffuse abdominal pain.  She feels that this is worsened over the past 3 weeks.  She reports dysfunctional uterine bleeding over the past several weeks.  She reports nausea vomiting today with decreased oral intake.  She was vomiting in triage.  She does have a history of asthma bipolar disorder.  No breathing difficulty.  Denies fevers and chills.  Denies dysuria, urinary frequency, urinary hesitancy.  Symptoms are mild to moderate in severity.  No diarrhea.   Past Medical History:  Diagnosis Date  . Asthma   . Bipolar 1 disorder [facility])     Patient Active Problem List   Diagnosis Date Noted  . Bipolar affective disorder, currently depressed, moderate (HCC)   . Bipolar disorder with current episode depressed (HCC) 05/03/2015  . Alcohol use disorder, mild, abuse 05/03/2015  . Cannabis use disorder, mild, abuse 05/03/2015    Past Surgical History:  Procedure Laterality Date  . CHOLECYSTECTOMY    . TONSILLECTOMY      OB History    No data available       Home Medications    Prior to Admission medications   Medication Sig Start Date End Date Taking? Authorizing Provider  acetaminophen (TYLENOL) 500 MG tablet Take 2 tablets (1,000 mg total) by mouth every 6 (six) hours as needed for mild pain, moderate pain or fever. For pain 05/05/15   Armandina Stammer I, NP  clonazePAM (KLONOPIN) 0.5 MG tablet Take 1 mg by mouth 2 (two) times daily. 07/17/15   [provider]  lamoTRIgine (LAMICTAL) 25 MG tablet Take 1 tablet (25 mg total) by mouth daily. For mood stabilization Patient not taking: Reported on 06/15/2015 05/05/15   Armandina Stammer I, NP  nicotine (NICODERM CQ - DOSED IN MG/24  HOURS) 21 mg/24hr patch Place 1 patch (21 mg total) onto the skin daily. For smoking cessation Patient not taking: Reported on 06/15/2015 05/05/15   Armandina Stammer I, NP  ondansetron (ZOFRAN ODT) 8 MG disintegrating tablet Take 1 tablet (8 mg total) by mouth every 8 (eight) hours as needed for nausea or vomiting. 09/11/16   Azalia Bilis, MD  oxcarbazepine (TRILEPTAL) 600 MG tablet Take 600 mg by mouth 2 (two) times daily. 07/16/15   [provider]  oxyCODONE-acetaminophen (PERCOCET/ROXICET) 5-325 MG tablet Take 1 tablet by mouth every 4 (four) hours as needed for severe pain. Patient not taking: Reported on 08/15/2015 06/15/15   Elpidio Anis, PA-C  VRAYLAR 3 MG CAPS Take 1 capsule by mouth at bedtime. 07/16/15   [provider]    Family History Family History  Problem Relation Age of Onset  . Bipolar disorder Father   . Heart disease Father   . Aneurysm Father     Social History Social History  Substance Use Topics  . Smoking status: Current Every Day Smoker    Packs/day: 1.00    Years: 8.00    Types: Cigarettes  . Smokeless tobacco: Never Used  . Alcohol use Yes     Comment: Every couple of weeks.      Allergies   Penicillins and Sulfa antibiotics   Review of Systems Review of Systems  All other systems reviewed  and are negative.    Physical Exam Updated Vital Signs BP 116/64   Pulse 90   Temp 98 F (36.7 C) (Oral)   Resp 16   Ht 5\' 2"  (1.575 m)   Wt 68.5 kg (151 lb)   LMP 09/04/2016   SpO2 97%   BMI 27.62 kg/m   Physical Exam  Constitutional: She is oriented to person, place, and time. She appears well-developed and well-nourished. No distress.  HENT:  Head: Normocephalic and atraumatic.  Eyes: EOM are normal.  Neck: Normal range of motion.  Cardiovascular: Normal rate, regular rhythm and normal heart sounds.   Pulmonary/Chest: Effort normal and breath sounds normal.  Abdominal: Soft. She exhibits no distension. There is no tenderness.    Musculoskeletal: Normal range of motion.  Neurological: She is alert and oriented to person, place, and time.  Skin: Skin is warm and dry.  Psychiatric: She has a normal mood and affect. Judgment normal.  Nursing note and vitals reviewed.    ED Treatments / Results  Labs (all labs ordered are listed, but only abnormal results are displayed) Labs Reviewed  URINALYSIS, ROUTINE W REFLEX MICROSCOPIC - Abnormal; Notable for the following:       Result Value   Color, Urine AMBER (*)    APPearance CLOUDY (*)    Specific Gravity, Urine 1.037 (*)    Hgb urine dipstick SMALL (*)    Bilirubin Urine SMALL (*)    Ketones, ur 15 (*)    Protein, ur 100 (*)    Leukocytes, UA SMALL (*)    All other components within normal limits  URINALYSIS, MICROSCOPIC (REFLEX) - Abnormal; Notable for the following:    Bacteria, UA MANY (*)    Squamous Epithelial / LPF 6-30 (*)    All other components within normal limits  CBC - Abnormal; Notable for the following:    WBC 17.8 (*)    Platelets 441 (*)    All other components within normal limits  COMPREHENSIVE METABOLIC PANEL - Abnormal; Notable for the following:    Potassium 3.0 (*)    Chloride 98 (*)    Glucose, Bld 128 (*)    Alkaline Phosphatase 134 (*)    All other components within normal limits  URINE CULTURE  PREGNANCY, URINE  LIPASE, BLOOD    EKG  EKG Interpretation None       Radiology Ct Abdomen Pelvis W Contrast  Result Date: 09/11/2016 CLINICAL DATA:  Generalized abdominal pain x3 weeks. Vomiting today. EXAM: CT ABDOMEN AND PELVIS WITH CONTRAST TECHNIQUE: Multidetector CT imaging of the abdomen and pelvis was performed using the standard protocol following bolus administration of intravenous contrast. CONTRAST:  100mL ISOVUE-300 IOPAMIDOL (ISOVUE-300) INJECTION 61% COMPARISON:  None. FINDINGS: Lower chest: Tiny hyperdense focus in the distal esophagus may represent ingested medication containing bismuth. Heart is normal in size  without pericardial effusion. No acute pneumonic consolidation, effusion or pneumothorax. Hepatobiliary: Status post cholecystectomy. Mild intrahepatic ductal dilatation consistent with reservoir effect status post gallbladder s removal. No space-occupying mass of the liver. Pancreas: Unremarkable. No pancreatic ductal dilatation or surrounding inflammatory changes. Spleen: Normal in size without focal abnormality. Adrenals/Urinary Tract: Adrenal glands are unremarkable. Kidneys are normal, without renal calculi, focal lesion, or hydronephrosis. Bladder is unremarkable. Stomach/Bowel: The stomach is contracted in appearance. There is normal small bowel rotation. Mild to moderate fluid-filled distention of small bowel loops with mild enhancement of the mucosa consistent with a diffuse enteritis. More speckled intraluminal hyperdensities within the stomach and intestine  are identified, likely to represent ingested medication, example Pepto-Bismol or other similar product. The appendix is normal caliber measuring up to 5-6 mm. Vascular/Lymphatic: No significant vascular findings are present. No enlarged abdominal or pelvic lymph nodes. Reproductive: Uterus and bilateral adnexa are unremarkable. Other: Small amount of free fluid in the pelvis is identified. Musculoskeletal: No acute or significant osseous findings. IMPRESSION: 1. Diffuse fluid-filled distention of small bowel with mucosal enhancement suggestive of an enteritis. No mechanical source of obstruction is identified. 2. Cholecystectomy. Electronically Signed   By: Tollie Eth M.D.   On: 09/11/2016 02:47    Procedures Procedures (including critical care time)  Medications Ordered in ED Medications  sodium chloride 0.9 % bolus 1,000 mL (0 mLs Intravenous Stopped 09/11/16 0240)    Followed by  0.9 %  sodium chloride infusion (1,000 mLs Intravenous New Bag/Given 09/11/16 0158)  morphine 4 MG/ML injection 4 mg (4 mg Intravenous Given 09/11/16 0157)    ondansetron (ZOFRAN) injection 4 mg (4 mg Intravenous Given 09/11/16 0157)  iopamidol (ISOVUE-300) 61 % injection 100 mL (100 mLs Intravenous Contrast Given 09/11/16 0221)     Initial Impression / Assessment and Plan / ED Course  I have reviewed the triage vital signs and the nursing notes.  Pertinent labs & imaging results that were available during my care of the patient were reviewed by me and considered in my medical decision making (see chart for details).     Nausea and vomiting today.  No diarrhea.  May be developing an gastroenteritis as she does have fluid-filled small bowel.  She feels much better at this time.  Urine I think is a contaminated specimen.  Clinically she has no urinary symptoms at all.  Urine culture will be sent.  No antibiotics will be given.  Overall well-appearing.  Vital signs are stable.  Discharge home in good condition.  Patient understands to return to the ER for new or worsening symptoms  Final Clinical Impressions(s) / ED Diagnoses   Final diagnoses:  Acute abdominal pain    New Prescriptions New Prescriptions   ONDANSETRON (ZOFRAN ODT) 8 MG DISINTEGRATING TABLET    Take 1 tablet (8 mg total) by mouth every 8 (eight) hours as needed for nausea or vomiting.     Azalia Bilis, MD 09/11/16 0400

## 2016-09-12 LAB — URINE CULTURE

## 2016-10-25 ENCOUNTER — Encounter (HOSPITAL_COMMUNITY): Payer: Self-pay | Admitting: Neurology

## 2016-10-25 ENCOUNTER — Telehealth: Payer: Self-pay | Admitting: Emergency Medicine

## 2016-10-25 ENCOUNTER — Emergency Department (HOSPITAL_COMMUNITY): Payer: Self-pay

## 2016-10-25 ENCOUNTER — Emergency Department (HOSPITAL_COMMUNITY)
Admission: EM | Admit: 2016-10-25 | Discharge: 2016-10-25 | Disposition: A | Discharge status: Home / Self Care | Payer: Self-pay | Attending: Emergency Medicine | Admitting: Emergency Medicine

## 2016-10-25 DIAGNOSIS — R059 Cough, unspecified: Secondary | ICD-10-CM

## 2016-10-25 DIAGNOSIS — J069 Acute upper respiratory infection, unspecified: Secondary | ICD-10-CM | POA: Insufficient documentation

## 2016-10-25 DIAGNOSIS — R112 Nausea with vomiting, unspecified: Secondary | ICD-10-CM | POA: Insufficient documentation

## 2016-10-25 DIAGNOSIS — F17210 Nicotine dependence, cigarettes, uncomplicated: Secondary | ICD-10-CM | POA: Insufficient documentation

## 2016-10-25 DIAGNOSIS — Z79899 Other long term (current) drug therapy: Secondary | ICD-10-CM | POA: Insufficient documentation

## 2016-10-25 DIAGNOSIS — J45909 Unspecified asthma, uncomplicated: Secondary | ICD-10-CM | POA: Insufficient documentation

## 2016-10-25 DIAGNOSIS — R05 Cough: Secondary | ICD-10-CM | POA: Insufficient documentation

## 2016-10-25 LAB — COMPREHENSIVE METABOLIC PANEL
ALK PHOS: 77 U/L (ref 38–126)
ALT: 43 U/L (ref 14–54)
AST: 37 U/L (ref 15–41)
Albumin: 4.1 g/dL (ref 3.5–5.0)
Anion gap: 7 (ref 5–15)
BUN: 11 mg/dL (ref 6–20)
CALCIUM: 9.3 mg/dL (ref 8.9–10.3)
CHLORIDE: 103 mmol/L (ref 101–111)
CO2: 25 mmol/L (ref 22–32)
CREATININE: 0.65 mg/dL (ref 0.44–1.00)
Glucose, Bld: 103 mg/dL — ABNORMAL HIGH (ref 65–99)
Potassium: 3.7 mmol/L (ref 3.5–5.1)
Sodium: 135 mmol/L (ref 135–145)
Total Bilirubin: 0.8 mg/dL (ref 0.3–1.2)
Total Protein: 7.5 g/dL (ref 6.5–8.1)

## 2016-10-25 LAB — CBC
HCT: 39.2 % (ref 36.0–46.0)
Hemoglobin: 12.8 g/dL (ref 12.0–15.0)
MCH: 30.6 pg (ref 26.0–34.0)
MCHC: 32.7 g/dL (ref 30.0–36.0)
MCV: 93.8 fL (ref 78.0–100.0)
PLATELETS: 378 10*3/uL (ref 150–400)
RBC: 4.18 MIL/uL (ref 3.87–5.11)
RDW: 13.5 % (ref 11.5–15.5)
WBC: 16.7 10*3/uL — AB (ref 4.0–10.5)

## 2016-10-25 LAB — I-STAT BETA HCG BLOOD, ED (MC, WL, AP ONLY): I-stat hCG, quantitative: <5 m[IU]/mL (ref <5)

## 2016-10-25 LAB — URINALYSIS, ROUTINE W REFLEX MICROSCOPIC
Bilirubin Urine: NEGATIVE
GLUCOSE, UA: NEGATIVE mg/dL
Ketones, ur: 5 mg/dL — AB
Leukocytes, UA: NEGATIVE
Nitrite: NEGATIVE
PH: 5 (ref 5.0–8.0)
Protein, ur: 30 mg/dL — AB
SPECIFIC GRAVITY, URINE: 1.031 — AB (ref 1.005–1.030)

## 2016-10-25 LAB — LIPASE, BLOOD: LIPASE: 22 U/L (ref 11–51)

## 2016-10-25 MED ORDER — SODIUM CHLORIDE 0.9 % IV BOLUS (SEPSIS)
1000.0000 mL | Freq: Once | INTRAVENOUS | Status: AC
  Administered 2016-10-25: 1000 mL via INTRAVENOUS

## 2016-10-25 MED ORDER — IBUPROFEN 400 MG PO TABS
600.0000 mg | ORAL_TABLET | Freq: Once | ORAL | Status: AC
  Administered 2016-10-25: 600 mg via ORAL
  Filled 2016-10-25: qty 1

## 2016-10-25 MED ORDER — AZITHROMYCIN 250 MG PO TABS: ORAL_TABLET | ORAL | 0 refills | Status: DC

## 2016-10-25 MED ORDER — ONDANSETRON 4 MG PO TBDP: ORAL_TABLET | ORAL | 0 refills | Status: DC

## 2016-10-25 MED ORDER — ONDANSETRON HCL 4 MG/2ML IJ SOLN
4.0000 mg | Freq: Once | INTRAMUSCULAR | Status: AC
  Administered 2016-10-25: 4 mg via INTRAVENOUS
  Filled 2016-10-25: qty 2

## 2016-10-25 NOTE — ED Provider Notes (Signed)
[facility]-EMERGENCY DEPT Provider Note   CSN: [id number] Arrival date & time: 10/25/16  1191     History   Chief Complaint Chief Complaint  Patient presents with  . Cough  . Nausea  . Emesis    HPI  April James is a 26 y.o. female with a history of asthma, presents with 2 weeks of cough, nasal congestion and runny nose. Patient reports cough has become progressively worse, now productive of greenish sputum. Yesterday evening she started having vomiting and nausea, with some epigastric tenderness, no bloody or coffee-ground emesis. Patient reports some shortness of breath for which she has used her albuterol inhaler with some improvement. Patient also complains of some pleuritic chest pain worse on the right than the left. Patient reports fever of 100.3 at home prior to arrival, she took DayQuil with Tylenol in it before coming to the ED. Patient has tried DayQuil for several days, has not tried anything else to treat her symptoms this initially helped but over the past week cough has not been improving.  Pt currently on menstrural cycle.       Past Medical History:  Diagnosis Date  . Asthma   . Bipolar 1 disorder [facility])     Patient Active Problem List   Diagnosis Date Noted  . Bipolar affective disorder, currently depressed, moderate (HCC)   . Bipolar disorder with current episode depressed (HCC) 05/03/2015  . Alcohol use disorder, mild, abuse 05/03/2015  . Cannabis use disorder, mild, abuse 05/03/2015    Past Surgical History:  Procedure Laterality Date  . CHOLECYSTECTOMY    . TONSILLECTOMY      OB History    No data available       Home Medications    Prior to Admission medications   Medication Sig Start Date End Date Taking? Authorizing Provider  acetaminophen (TYLENOL) 500 MG tablet Take 2 tablets (1,000 mg total) by mouth every 6 (six) hours as needed for mild pain, moderate pain or fever. For pain 05/05/15   Armandina Stammer I, NP  clonazePAM (KLONOPIN) 0.5 MG  tablet Take 1 mg by mouth 2 (two) times daily. 07/17/15   [provider]  lamoTRIgine (LAMICTAL) 25 MG tablet Take 1 tablet (25 mg total) by mouth daily. For mood stabilization Patient not taking: Reported on 06/15/2015 05/05/15   Armandina Stammer I, NP  nicotine (NICODERM CQ - DOSED IN MG/24 HOURS) 21 mg/24hr patch Place 1 patch (21 mg total) onto the skin daily. For smoking cessation Patient not taking: Reported on 06/15/2015 05/05/15   Armandina Stammer I, NP  ondansetron (ZOFRAN ODT) 8 MG disintegrating tablet Take 1 tablet (8 mg total) by mouth every 8 (eight) hours as needed for nausea or vomiting. 09/11/16   Azalia Bilis, MD  oxcarbazepine (TRILEPTAL) 600 MG tablet Take 600 mg by mouth 2 (two) times daily. 07/16/15   [provider]  oxyCODONE-acetaminophen (PERCOCET/ROXICET) 5-325 MG tablet Take 1 tablet by mouth every 4 (four) hours as needed for severe pain. Patient not taking: Reported on 08/15/2015 06/15/15   Elpidio Anis, PA-C  VRAYLAR 3 MG CAPS Take 1 capsule by mouth at bedtime. 07/16/15   [provider]    Family History Family History  Problem Relation Age of Onset  . Bipolar disorder Father   . Heart disease Father   . Aneurysm Father     Social History Social History  Substance Use Topics  . Smoking status: Current Every Day Smoker    Packs/day: 1.00  Years: 8.00    Types: Cigarettes  . Smokeless tobacco: Never Used  . Alcohol use Yes     Comment: Every couple of weeks.      Allergies   Penicillins and Sulfa antibiotics   Review of Systems Review of Systems  Constitutional: Positive for chills and fever.  HENT: Positive for congestion, postnasal drip, rhinorrhea, sinus pressure and sore throat. Negative for ear pain, trouble swallowing and voice change.   Eyes: Negative for photophobia, discharge, itching and visual disturbance.  Respiratory: Positive for cough, chest tightness and shortness of breath.   Cardiovascular: Positive for chest  pain (pleuritic). Negative for palpitations and leg swelling.  Gastrointestinal: Positive for abdominal pain (epigastric), nausea and vomiting.  Genitourinary: Negative for difficulty urinating and dysuria.  Musculoskeletal: Positive for myalgias.  Skin: Negative for pallor and rash.  Neurological: Positive for headaches (sinus headaches). Negative for dizziness, facial asymmetry and weakness.     Physical Exam Updated Vital Signs BP 123/75 (BP Location: Left Arm)   Pulse 100   Temp 98.1 F (36.7 C) (Oral)   Resp 18   Ht  (1.549 m)   Wt 65.8 kg (145 lb)   LMP 10/25/2016   SpO2 100%   BMI 27.40 kg/m   Physical Exam  Constitutional: She is oriented to person, place, and time. She appears well-developed and well-nourished. No distress.  HENT:  Head: Normocephalic and atraumatic.  TMs clear with good landmarks, moderate nasal mucosa edema with clear rhinorrhea, posterior oropharynx clear, mild erythema, without edema or exudates. No trismus, handling secretions   Eyes: Right eye exhibits no discharge. Left eye exhibits no discharge.  Neck: Neck supple.  Cardiovascular: Normal rate, regular rhythm, normal heart sounds and intact distal pulses.   Pulmonary/Chest: Effort normal. No stridor. No respiratory distress. She has no wheezes. She has rales.  Good air movement bilaterally, no wheezes, some course crackles in right lung base, no evidence of respiratory distress  Abdominal: Soft. Bowel sounds are normal.  TTP in epigastrium, with minimal guarding, NTTP in all other quadrants, neg muphy's sign, NTTP at Mcburney's point  Lymphadenopathy:    She has no cervical adenopathy.  Neurological: She is alert and oriented to person, place, and time. Coordination normal.  Skin: Skin is warm and dry. Capillary refill takes less than 2 seconds. She is not diaphoretic.  Psychiatric: She has a normal mood and affect. Her behavior is normal.  Nursing note and vitals reviewed.    ED  Treatments / Results  Labs (all labs ordered are listed, but only abnormal results are displayed) Labs Reviewed  COMPREHENSIVE METABOLIC PANEL - Abnormal; Notable for the following:       Result Value   Glucose, Bld 103 (*)    All other components within normal limits  CBC - Abnormal; Notable for the following:    WBC 16.7 (*)    All other components within normal limits  URINALYSIS, ROUTINE W REFLEX MICROSCOPIC - Abnormal; Notable for the following:    Color, Urine AMBER (*)    APPearance HAZY (*)    Specific Gravity, Urine 1.031 (*)    Hgb urine dipstick LARGE (*)    Ketones, ur 5 (*)    Protein, ur 30 (*)    Bacteria, UA RARE (*)    Squamous Epithelial / LPF 6-30 (*)    All other components within normal limits  LIPASE, BLOOD  I-STAT BETA HCG BLOOD, ED ([facility], [facility], [facility] ONLY)    EKG  EKG  Interpretation None       Radiology Dg Chest 2 View  Result Date: 10/25/2016 CLINICAL DATA:  Productive cough and chest pain for 2 weeks EXAM: CHEST  2 VIEW COMPARISON:  08/15/2015 FINDINGS: The heart size and mediastinal contours are within normal limits. Both lungs are clear. The visualized skeletal structures are unremarkable. IMPRESSION: No active cardiopulmonary disease. Electronically Signed   By: Alcide Clever M.D.   On: 10/25/2016 15:11    Procedures Procedures (including critical care time)  Medications Ordered in ED Medications  sodium chloride 0.9 % bolus 1,000 mL (not administered)  ondansetron (ZOFRAN) injection 4 mg (not administered)  ibuprofen (ADVIL,MOTRIN) tablet 600 mg (not administered)     Initial Impression / Assessment and Plan / ED Course  I have reviewed the triage vital signs and the nursing notes.  Pertinent labs & imaging results that were available during my care of the patient were reviewed by me and considered in my medical decision making (see chart for details).  Patient presents with URI symptoms and progressively worsening cough and N/V. Afebrile  here but fever PTA and treated with tylenol, vitals otherwise normal. Some SOB and pleuritic CP and faint crackles in right lung base. CXR negative for PNA, but given pts symptoms and hx of asthma will go ahead and tx with Azithromycin. N/V for 1 day, improved with zofran and IV fluids, patient able to tolerate PO. Epigastric tenderness, abdominal exam unconcerned for surgical process, tenderness improved with ibuprofen and zofran. Labs show leukocytosis, otherwise unremarkable. Patient can be discharged home safely, ABX for pneumonia and zofran for nausea. Pt has no insurance and was concerned about cost, GoodRx coupons for both medications provided. Discussed continuing to  treat URI symptoms with OTC meds like dayquil or sudafed. Pt to follow up with [facility] or [facility]. Return precautions provided, pt expresses understanding an is in agreement with plan  Final Clinical Impressions(s) / ED Diagnoses   Final diagnoses:  Cough  Non-intractable vomiting with nausea, unspecified vomiting type  Upper respiratory tract infection, unspecified type    New Prescriptions Discharge Medication List as of 10/25/2016  4:35 PM    START taking these medications   Details  azithromycin (ZITHROMAX Z-PAK) 250 MG tablet 2 po day one, then 1 daily x 4 days, Print    !! ondansetron (ZOFRAN ODT) 4 MG disintegrating tablet  ODT q4 hours prn nausea/vomit, Print     !! - Potential duplicate medications found. Please discuss with provider.       Dartha Lodge, PA-C 10/25/16 2256    Tegeler, Canary Brim, MD 10/26/16 2031

## 2016-10-25 NOTE — ED Notes (Signed)
Patient transported to X-ray 

## 2016-10-25 NOTE — ED Triage Notes (Addendum)
Pt reports cough, runny nose x 2 weeks. Yesterday started vomiting with nausea, continued cough. Had fever yesterday intermittently. Has been taking Dayquil cold/flu. Mask in place.

## 2016-10-25 NOTE — Telephone Encounter (Signed)
Pharmacy called to confirm permission to given 6 tablets in a z-pack to a pt instead of the written order for 5 tablets.  Instructions read for 6 tablets.  Gave permission to dispense 6 tablets to pt.  No further CM needs noted at this time.

## 2016-10-25 NOTE — Discharge Instructions (Signed)
Please complete course of antibiotics as directed. You may take Zofran as needed for nausea, if abdominal pain becomes more localized or persistent area unable to keep down fluids or food please return to the emergency department. You can treat cough, and congestion symptoms with over-the-counter agents such as DayQuil and Sudafed. You can follow-up with the community [facility] or [facility], both of these clinics do work with the uninsured population.

## 2020-04-23 ENCOUNTER — Other Ambulatory Visit: Payer: Self-pay

## 2020-04-23 ENCOUNTER — Emergency Department
Admission: EM | Admit: 2020-04-23 | Discharge: 2020-04-23 | Disposition: A | Discharge status: Home / Self Care | Payer: Medicaid Other | Attending: Emergency Medicine | Admitting: Emergency Medicine

## 2020-04-23 DIAGNOSIS — J029 Acute pharyngitis, unspecified: Secondary | ICD-10-CM | POA: Insufficient documentation

## 2020-04-23 DIAGNOSIS — Z5321 Procedure and treatment not carried out due to patient leaving prior to being seen by health care provider: Secondary | ICD-10-CM | POA: Insufficient documentation

## 2020-04-23 DIAGNOSIS — R110 Nausea: Secondary | ICD-10-CM | POA: Diagnosis not present

## 2020-04-23 DIAGNOSIS — R1084 Generalized abdominal pain: Secondary | ICD-10-CM | POA: Insufficient documentation

## 2020-04-23 LAB — CBC
HCT: 38.6 % (ref 36.0–46.0)
Hemoglobin: 13.3 g/dL (ref 12.0–15.0)
MCH: 32.5 pg (ref 26.0–34.0)
MCHC: 34.5 g/dL (ref 30.0–36.0)
MCV: 94.4 fL (ref 80.0–100.0)
Platelets: 331 10*3/uL (ref 150–400)
RBC: 4.09 MIL/uL (ref 3.87–5.11)
RDW: 12 % (ref 11.5–15.5)
WBC: 7.9 10*3/uL (ref 4.0–10.5)
nRBC: 0 % (ref 0.0–0.2)

## 2020-04-23 LAB — COMPREHENSIVE METABOLIC PANEL
ALT: 36 U/L (ref 0–44)
AST: 26 U/L (ref 15–41)
Albumin: 4.2 g/dL (ref 3.5–5.0)
Alkaline Phosphatase: 56 U/L (ref 38–126)
Anion gap: 9 (ref 5–15)
BUN: 12 mg/dL (ref 6–20)
CO2: 25 mmol/L (ref 22–32)
Calcium: 9.2 mg/dL (ref 8.9–10.3)
Chloride: 103 mmol/L (ref 98–111)
Creatinine, Ser: 0.64 mg/dL (ref 0.44–1.00)
GFR, Estimated: >60 mL/min (ref >60)
Glucose, Bld: 109 mg/dL — ABNORMAL HIGH (ref 70–99)
Potassium: 3.1 mmol/L — ABNORMAL LOW (ref 3.5–5.1)
Sodium: 137 mmol/L (ref 135–145)
Total Bilirubin: 0.7 mg/dL (ref 0.3–1.2)
Total Protein: 7 g/dL (ref 6.5–8.1)

## 2020-04-23 LAB — LIPASE, BLOOD: Lipase: 24 U/L (ref 11–51)

## 2020-04-23 NOTE — ED Triage Notes (Signed)
Pt states generalized abdominal pain and a sore throat. Pt states son at home as strep. Pt states denies current vomiting and diarrhea but does endorse nausea.

## 2021-01-02 ENCOUNTER — Other Ambulatory Visit: Payer: Self-pay

## 2021-01-02 ENCOUNTER — Emergency Department (HOSPITAL_COMMUNITY)
Admission: EM | Admit: 2021-01-02 | Discharge: 2021-01-02 | Disposition: A | Discharge status: Home / Self Care | Payer: Medicaid Other | Attending: Emergency Medicine | Admitting: Emergency Medicine

## 2021-01-02 ENCOUNTER — Emergency Department (HOSPITAL_COMMUNITY): Payer: Medicaid Other

## 2021-01-02 ENCOUNTER — Encounter (HOSPITAL_COMMUNITY): Payer: Self-pay | Admitting: Emergency Medicine

## 2021-01-02 DIAGNOSIS — R0789 Other chest pain: Secondary | ICD-10-CM | POA: Diagnosis not present

## 2021-01-02 DIAGNOSIS — R109 Unspecified abdominal pain: Secondary | ICD-10-CM | POA: Insufficient documentation

## 2021-01-02 DIAGNOSIS — Y92410 Unspecified street and highway as the place of occurrence of the external cause: Secondary | ICD-10-CM | POA: Diagnosis not present

## 2021-01-02 DIAGNOSIS — J45909 Unspecified asthma, uncomplicated: Secondary | ICD-10-CM | POA: Diagnosis not present

## 2021-01-02 DIAGNOSIS — F17210 Nicotine dependence, cigarettes, uncomplicated: Secondary | ICD-10-CM | POA: Insufficient documentation

## 2021-01-02 LAB — I-STAT CHEM 8, ED
BUN: 13 mg/dL (ref 6–20)
Calcium, Ion: 1.16 mmol/L (ref 1.15–1.40)
Chloride: 107 mmol/L (ref 98–111)
Creatinine, Ser: 0.7 mg/dL (ref 0.44–1.00)
Glucose, Bld: 93 mg/dL (ref 70–99)
HCT: 44 % (ref 36.0–46.0)
Hemoglobin: 15 g/dL (ref 12.0–15.0)
Potassium: 4.2 mmol/L (ref 3.5–5.1)
Sodium: 140 mmol/L (ref 135–145)
TCO2: 24 mmol/L (ref 22–32)

## 2021-01-02 LAB — I-STAT BETA HCG BLOOD, ED (MC, WL, AP ONLY): I-stat hCG, quantitative: <5 m[IU]/mL (ref <5)

## 2021-01-02 MED ORDER — MORPHINE SULFATE (PF) 4 MG/ML IV SOLN
4.0000 mg | Freq: Once | INTRAVENOUS | Status: AC
  Administered 2021-01-02: 4 mg via INTRAVENOUS
  Filled 2021-01-02: qty 1

## 2021-01-02 MED ORDER — SODIUM CHLORIDE 0.9 % IV BOLUS
1000.0000 mL | Freq: Once | INTRAVENOUS | Status: AC
  Administered 2021-01-02: 1000 mL via INTRAVENOUS

## 2021-01-02 MED ORDER — KETOROLAC TROMETHAMINE 30 MG/ML IJ SOLN
15.0000 mg | Freq: Once | INTRAMUSCULAR | Status: AC
  Administered 2021-01-02: 15 mg via INTRAVENOUS
  Filled 2021-01-02: qty 1

## 2021-01-02 MED ORDER — IOHEXOL 300 MG/ML  SOLN
100.0000 mL | Freq: Once | INTRAMUSCULAR | Status: AC | PRN
  Administered 2021-01-02: 100 mL via INTRAVENOUS
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
## 2021-01-02 NOTE — ED Notes (Signed)
Patient transported to CT 
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
## 2021-08-24 ENCOUNTER — Other Ambulatory Visit (HOSPITAL_BASED_OUTPATIENT_CLINIC_OR_DEPARTMENT_OTHER): Payer: Self-pay

## 2021-08-24 ENCOUNTER — Encounter (HOSPITAL_BASED_OUTPATIENT_CLINIC_OR_DEPARTMENT_OTHER): Payer: Self-pay | Admitting: Obstetrics and Gynecology

## 2021-08-24 ENCOUNTER — Other Ambulatory Visit: Payer: Self-pay

## 2021-08-24 ENCOUNTER — Emergency Department (HOSPITAL_BASED_OUTPATIENT_CLINIC_OR_DEPARTMENT_OTHER)
Admission: EM | Admit: 2021-08-24 | Discharge: 2021-08-24 | Disposition: A | Discharge status: Home / Self Care | Payer: Medicaid Other | Attending: Emergency Medicine | Admitting: Emergency Medicine

## 2021-08-24 DIAGNOSIS — R21 Rash and other nonspecific skin eruption: Secondary | ICD-10-CM | POA: Diagnosis present

## 2021-08-24 DIAGNOSIS — L301 Dyshidrosis [pompholyx]: Secondary | ICD-10-CM | POA: Insufficient documentation

## 2021-08-24 DIAGNOSIS — J45909 Unspecified asthma, uncomplicated: Secondary | ICD-10-CM | POA: Diagnosis not present

## 2021-08-24 MED ORDER — TRIAMCINOLONE ACETONIDE 0.5 % EX CREA
TOPICAL_CREAM | CUTANEOUS | 0 refills | Status: DC
  Filled 2021-08-24: qty 30, 15d supply, fill #0

## 2021-08-24 MED ORDER — TRIAMCINOLONE ACETONIDE 0.5 % EX OINT: 1.0000 | TOPICAL_OINTMENT | Freq: Two times a day (BID) | CUTANEOUS | 0 refills | Status: AC

## 2021-08-24 NOTE — ED Provider Notes (Signed)
[facility] EMERGENCY DEPT Provider Note   CSN: [id number] Arrival date & time: 08/24/21  1003     History  Chief Complaint  Patient presents with   Hand Pain    April James is a 31 y.o. female.  31 year old female with a history of eczema who presents emergency department with palmar rash.  States that 1 month ago she started experiencing small bumps on her palm and then 1 week ago it started turning into a peeling rash.  Says that this is happened before with her eczema but that is more painful this time.  Says that she has used Aquaphor and Vaseline for symptomatic treatment without significant results.  Not currently taking any other medications.  Says that she has been in the woods recently but denies any ticks.  No history of STIs or concerns at this time as she is monogamous with her fianc.  Denies any rash on her oropharynx or soles of her feet or elsewhere on her body.   Hand Pain Pertinent negatives include no headaches.   Past Medical History:  Diagnosis Date   Asthma    Bipolar 1 disorder (HCC)        Home Medications Prior to Admission medications   Medication Sig Start Date End Date Taking? Authorizing Provider  triamcinolone ointment (KENALOG) 0.5 % Apply 1 Application topically 2 (two) times daily for 15 days. 08/24/21 09/08/21 Yes Rondel Baton, MD  acetaminophen (TYLENOL) 500 MG tablet Take 2 tablets (1,000 mg total) by mouth every 6 (six) hours as needed for mild pain, moderate pain or fever. For pain 05/05/15   Armandina Stammer I, NP  azithromycin (ZITHROMAX Z-PAK) 250 MG tablet 2 po day one, then 1 daily x 4 days 10/25/16   Dartha Lodge, PA-C  clonazePAM (KLONOPIN) 0.5 MG tablet Take 1 mg by mouth 2 (two) times daily. 07/17/15   [provider]  lamoTRIgine (LAMICTAL) 25 MG tablet Take 1 tablet (25 mg total) by mouth daily. For mood stabilization Patient not taking: Reported on 06/15/2015 05/05/15   Armandina Stammer I, NP  nicotine (NICODERM  CQ - DOSED IN MG/24 HOURS) 21 mg/24hr patch Place 1 patch (21 mg total) onto the skin daily. For smoking cessation Patient not taking: Reported on 06/15/2015 05/05/15   Armandina Stammer I, NP  ondansetron (ZOFRAN ODT) 4 MG disintegrating tablet 4mg  ODT q4 hours prn nausea/vomit 10/25/16   10/27/16, PA-C  ondansetron (ZOFRAN ODT) 8 MG disintegrating tablet Take 1 tablet (8 mg total) by mouth every 8 (eight) hours as needed for nausea or vomiting. 09/11/16   11/11/16, MD  oxcarbazepine (TRILEPTAL) 600 MG tablet Take 600 mg by mouth 2 (two) times daily. 07/16/15   [provider]  oxyCODONE-acetaminophen (PERCOCET/ROXICET) 5-325 MG tablet Take 1 tablet by mouth every 4 (four) hours as needed for severe pain. Patient not taking: Reported on 08/15/2015 06/15/15   06/17/15, PA-C  VRAYLAR 3 MG CAPS Take 1 capsule by mouth at bedtime. 07/16/15   [provider]      Allergies    Penicillins and Sulfa antibiotics    Review of Systems   Review of Systems  Constitutional:  Negative for chills and fever.  Eyes:  Negative for visual disturbance.  Musculoskeletal:  Negative for arthralgias, myalgias and neck stiffness.  Skin:  Positive for rash.  Neurological:  Negative for headaches.    Physical Exam Updated Vital Signs BP 140/76 (BP Location: Left Arm)   Pulse 79  Temp 98.4 F (36.9 C)   Resp 18   Ht 5\' 2"  (1.575 m)   Wt 65.8 kg   LMP 08/14/2021 (Exact Date)   SpO2 100%   BMI 26.52 kg/m  Physical Exam Vitals and nursing note reviewed.  Constitutional:      General: She is not in acute distress.    Appearance: She is well-developed.  HENT:     Head: Normocephalic and atraumatic.     Right Ear: External ear normal.     Left Ear: External ear normal.     Nose: Nose normal.     Mouth/Throat:     Mouth: Mucous membranes are moist.     Pharynx: Oropharynx is clear. No oropharyngeal exudate or posterior oropharyngeal erythema.  Eyes:     General:        Right  eye: No discharge.        Left eye: No discharge.     Extraocular Movements: Extraocular movements intact.     Conjunctiva/sclera: Conjunctivae normal.     Pupils: Pupils are equal, round, and reactive to light.  Cardiovascular:     Rate and Rhythm: Normal rate and regular rhythm.     Heart sounds: No murmur heard. Pulmonary:     Effort: Pulmonary effort is normal. No respiratory distress.     Breath sounds: Normal breath sounds.  Abdominal:     General: Abdomen is flat. There is no distension.     Palpations: Abdomen is soft. There is no mass.     Tenderness: There is no abdominal tenderness. There is no guarding.  Musculoskeletal:        General: No swelling or tenderness. Normal range of motion.     Cervical back: Neck supple.  Skin:    General: Skin is warm and dry.     Capillary Refill: Capillary refill takes less than 2 seconds.     Comments: Dry and scaly rash of the palms bilaterally.  No rash.  On the soles.  No rash of the oropharynx.  Patient exposed to not have rash on torso, elsewhere on upper extremities, or head or neck.  No ticks noted.  Neurological:     General: No focal deficit present.     Mental Status: She is alert and oriented to person, place, and time. Mental status is at baseline.  Psychiatric:        Mood and Affect: Mood normal.     ED Results / Procedures / Treatments   Labs (all labs ordered are listed, but only abnormal results are displayed) Labs Reviewed - No data to display  EKG None  Radiology No results found.  Procedures Procedures    Medications Ordered in ED Medications - No data to display  ED Course/ Medical Decision Making/ A&P                           Medical Decision Making 31 year old female with a history of eczema who presents with a palmar rash.  Appears to be consistent with acute dyshidrotic eczema.  Has had similar presentations in the past.  Does not have other concerning features such as new drug exposure or  mucosal involvement or involvement of the soles of her feet.  No ticks noted that would indicate [facility] spotted fever or other tickborne illness and no concern for syphilis at this time.  Offered the patient an STI and syphilis testing which she declined today.  Patient was  prescribed triamcinolone cream and instructed to continue to use Aquaphor as a moisturizer.  Also given instructions to follow-up with dermatology and her primary care doctor.  Risk Prescription drug management.   Final Clinical Impression(s) / ED Diagnoses Final diagnoses:  Dyshidrotic eczema    Rx / DC Orders ED Discharge Orders          Ordered    triamcinolone ointment (KENALOG) 0.5 %  2 times daily        08/24/21 1142              Fransico Meadow, MD 08/24/21 1601

## 2021-08-24 NOTE — ED Triage Notes (Signed)
Patient reports to the ER for having palmar hives that happened x1 month and lasted x4 days and then states her hands started peeling. Patient reports a hx of eczema but never dishydrotic eczema.  Patient denies chance of syphilis, denies other symptoms.

## 2021-08-24 NOTE — Discharge Instructions (Addendum)
Please take the triamcinolone ointment for 2 weeks that we have prescribed you.  Continue to use your Aquaphor ointment as well.  Schedule follow-up with dermatology as soon as possible to discuss her condition and your primary care doctor in the next 3 to 4 days.  Return immediately to emergency department if you experience any of the following: Worsening pain, bleeding, redness, fevers, headache, neck pain or stiffness, or any other concerning symptoms.

## 2021-10-01 ENCOUNTER — Other Ambulatory Visit: Payer: Self-pay

## 2021-10-01 ENCOUNTER — Encounter (HOSPITAL_BASED_OUTPATIENT_CLINIC_OR_DEPARTMENT_OTHER): Payer: Self-pay

## 2021-10-01 ENCOUNTER — Emergency Department (HOSPITAL_BASED_OUTPATIENT_CLINIC_OR_DEPARTMENT_OTHER)
Admission: EM | Admit: 2021-10-01 | Discharge: 2021-10-01 | Disposition: A | Discharge status: Home / Self Care | Payer: Medicaid Other | Attending: Emergency Medicine | Admitting: Emergency Medicine

## 2021-10-01 DIAGNOSIS — D72829 Elevated white blood cell count, unspecified: Secondary | ICD-10-CM | POA: Diagnosis not present

## 2021-10-01 DIAGNOSIS — R21 Rash and other nonspecific skin eruption: Secondary | ICD-10-CM | POA: Insufficient documentation

## 2021-10-01 LAB — CBC WITH DIFFERENTIAL/PLATELET
Abs Immature Granulocytes: 0.04 10*3/uL (ref 0.00–0.07)
Basophils Absolute: 0.1 10*3/uL (ref 0.0–0.1)
Basophils Relative: 0 %
Eosinophils Absolute: 0.2 10*3/uL (ref 0.0–0.5)
Eosinophils Relative: 1 %
HCT: 41.7 % (ref 36.0–46.0)
Hemoglobin: 14.6 g/dL (ref 12.0–15.0)
Immature Granulocytes: 0 %
Lymphocytes Relative: 20 %
Lymphs Abs: 2.5 10*3/uL (ref 0.7–4.0)
MCH: 33 pg (ref 26.0–34.0)
MCHC: 35 g/dL (ref 30.0–36.0)
MCV: 94.1 fL (ref 80.0–100.0)
Monocytes Absolute: 0.6 10*3/uL (ref 0.1–1.0)
Monocytes Relative: 5 %
Neutro Abs: 9.2 10*3/uL — ABNORMAL HIGH (ref 1.7–7.7)
Neutrophils Relative %: 74 %
Platelets: 343 10*3/uL (ref 150–400)
RBC: 4.43 MIL/uL (ref 3.87–5.11)
RDW: 11.5 % (ref 11.5–15.5)
WBC: 12.5 10*3/uL — ABNORMAL HIGH (ref 4.0–10.5)
nRBC: 0 % (ref 0.0–0.2)

## 2021-10-01 LAB — BASIC METABOLIC PANEL
Anion gap: 8 (ref 5–15)
BUN: 15 mg/dL (ref 6–20)
CO2: 23 mmol/L (ref 22–32)
Calcium: 9.4 mg/dL (ref 8.9–10.3)
Chloride: 105 mmol/L (ref 98–111)
Creatinine, Ser: 0.61 mg/dL (ref 0.44–1.00)
GFR, Estimated: >60 mL/min (ref >60)
Glucose, Bld: 98 mg/dL (ref 70–99)
Potassium: 3.9 mmol/L (ref 3.5–5.1)
Sodium: 136 mmol/L (ref 135–145)

## 2021-10-01 NOTE — ED Triage Notes (Signed)
Pt presents POV from home with Left hand swelling, tingling and rash. Pt reports her hand was normal when she woke up at 0500.  Approx 45 mins ago she noticed the change in her hand   Discoloration and swelling noted to Left hand   Pt denies any injury, states she does not think she was bit by anything

## 2021-10-01 NOTE — ED Notes (Signed)
Dc instructions reviewed with patient. Patient voiced understanding. Dc with belongings.  °

## 2021-10-01 NOTE — ED Provider Notes (Signed)
[facility] EMERGENCY DEPT Provider Note   CSN: [id number] Arrival date & time: 10/01/21  7124     History  Chief Complaint  Patient presents with   Edema   Rash    April James is a 31 y.o. female.  Patient is a 31 year old female who presents with a rash to her left hand.  She noticed it this morning.  She feels like it was normal when she work up, but noticed it a couple of hours later. She feels a little tingling in the hand but otherwise does not have any complaints.  No pain in the hand.  No significant swelling.  No fevers.  No known injuries.  She does have a history of eczema.       Home Medications Prior to Admission medications   Medication Sig Start Date End Date Taking? Authorizing Provider  acetaminophen (TYLENOL) 500 MG tablet Take 2 tablets (1,000 mg total) by mouth every 6 (six) hours as needed for mild pain, moderate pain or fever. For pain 05/05/15   Armandina Stammer I, NP  azithromycin (ZITHROMAX Z-PAK) 250 MG tablet 2 po day one, then 1 daily x 4 days 10/25/16   Dartha Lodge, PA-C  clonazePAM (KLONOPIN) 0.5 MG tablet Take 1 mg by mouth 2 (two) times daily. 07/17/15   [provider]  lamoTRIgine (LAMICTAL) 25 MG tablet Take 1 tablet (25 mg total) by mouth daily. For mood stabilization Patient not taking: Reported on 06/15/2015 05/05/15   Armandina Stammer I, NP  nicotine (NICODERM CQ - DOSED IN MG/24 HOURS) 21 mg/24hr patch Place 1 patch (21 mg total) onto the skin daily. For smoking cessation Patient not taking: Reported on 06/15/2015 05/05/15   Armandina Stammer I, NP  ondansetron (ZOFRAN ODT) 4 MG disintegrating tablet 4mg  ODT q4 hours prn nausea/vomit 10/25/16   10/27/16, PA-C  ondansetron (ZOFRAN ODT) 8 MG disintegrating tablet Take 1 tablet (8 mg total) by mouth every 8 (eight) hours as needed for nausea or vomiting. 09/11/16   11/11/16, MD  oxcarbazepine (TRILEPTAL) 600 MG tablet Take 600 mg by mouth 2 (two) times daily. 07/16/15   [provider]  oxyCODONE-acetaminophen (PERCOCET/ROXICET) 5-325 MG tablet Take 1 tablet by mouth every 4 (four) hours as needed for severe pain. Patient not taking: Reported on 08/15/2015 06/15/15   06/17/15, PA-C  VRAYLAR 3 MG CAPS Take 1 capsule by mouth at bedtime. 07/16/15   [provider]      Allergies    Penicillins and Sulfa antibiotics    Review of Systems   Review of Systems  Constitutional:  Negative for fever.  Gastrointestinal:  Negative for nausea and vomiting.  Musculoskeletal:  Negative for arthralgias, back pain, joint swelling and neck pain.  Skin:  Positive for rash. Negative for wound.  Neurological:  Negative for weakness, numbness and headaches.    Physical Exam Updated Vital Signs BP 125/69 (BP Location: Right Arm)   Pulse 92   Temp 97.9 F (36.6 C) (Oral)   Resp 16   LMP 09/06/2021 (Exact Date)   SpO2 99%  Physical Exam Constitutional:      Appearance: She is well-developed.  HENT:     Head: Normocephalic and atraumatic.  Eyes:     Pupils: Pupils are equal, round, and reactive to light.  Cardiovascular:     Rate and Rhythm: Normal rate and regular rhythm.     Heart sounds: Normal heart sounds.  Pulmonary:     Effort:  Pulmonary effort is normal. No respiratory distress.     Breath sounds: Normal breath sounds. No wheezing or rales.  Chest:     Chest wall: No tenderness.  Abdominal:     General: Bowel sounds are normal.     Palpations: Abdomen is soft.     Tenderness: There is no abdominal tenderness. There is no guarding or rebound.  Musculoskeletal:        General: No tenderness. Normal range of motion.     Cervical back: Normal range of motion and neck supple.     Comments: Patient has a fine petechial rash to the dorsal surface of the left hand.  It extends over the wrist but does not go further up the arm.  There is no discoloration other than the rash.  No warmth or erythema.  No significant swelling.  She has good perfusion  distally.  No rash noted to her other extremities.  Lymphadenopathy:     Cervical: No cervical adenopathy.  Skin:    General: Skin is warm and dry.     Findings: No rash.  Neurological:     Mental Status: She is alert and oriented to person, place, and time.      ED Results / Procedures / Treatments   Labs (all labs ordered are listed, but only abnormal results are displayed) Labs Reviewed  CBC WITH DIFFERENTIAL/PLATELET - Abnormal; Notable for the following components:      Result Value   WBC 12.5 (*)    Neutro Abs 9.2 (*)    All other components within normal limits  BASIC METABOLIC PANEL    EKG None  Radiology No results found.  Procedures Procedures    Medications Ordered in ED Medications - No data to display  ED Course/ Medical Decision Making/ A&P                           Medical Decision Making Amount and/or Complexity of Data Reviewed Labs: ordered.   Patient is a 31 year old female who presents with a rash on her hand.  There is no systemic rash that I can identify.  It seems to be localized to the hand.  She does not note any trauma.  Her labs show a normal platelet count.  Her WBC count is mildly elevated but chart was reviewed and it has been elevated multiple times in the past, sometimes without obvious signs of infection.  She does not have a fever or other suggestions of infection.  I feel like this is a localized reaction.  Possibly from unrecognized trauma or an insect bite.  I do not see any suggestions of cellulitis.  At this point I do not see an indication for inpatient hospitalization.  She does not appear systemically ill.  She was discharged home in good condition.  She was given strict return precautions.  The area around the rash was marked for easier identification for worsening.  Final Clinical Impression(s) / ED Diagnoses Final diagnoses:  Rash and nonspecific skin eruption    Rx / DC Orders ED Discharge Orders     None          Rolan Bucco, MD 10/01/21 0930

## 2021-11-10 ENCOUNTER — Ambulatory Visit (INDEPENDENT_AMBULATORY_CARE_PROVIDER_SITE_OTHER): Payer: Medicaid Other | Admitting: Family Medicine

## 2021-11-10 ENCOUNTER — Encounter: Payer: Self-pay | Admitting: General Practice

## 2021-11-10 ENCOUNTER — Other Ambulatory Visit: Payer: Self-pay

## 2021-11-10 VITALS — BP 125/66 | HR 79 | Ht 62.0 in | Wt 184.0 lb

## 2021-11-10 DIAGNOSIS — Z3201 Encounter for pregnancy test, result positive: Secondary | ICD-10-CM

## 2021-11-10 LAB — POCT PREGNANCY, URINE: Preg Test, Ur: POSITIVE — AB

## 2021-11-10 NOTE — Progress Notes (Signed)
Patient presents to office today for UPT. UPT +. Patient reports first positive home test a month ago. LMP 09/06/21 EDD 06/13/22 [redacted]w[redacted]d. Allergies/meds reviewed. Discussed timing of new OB intake & new OB appt.   Koren Bound RN BSN 11/10/21

## 2021-11-10 NOTE — Patient Instructions (Signed)
Prenatal Care Providers           [facility]  [address] [phone]  [facility]   [address]  [phone]  [facility] [place]       [address] [phone]            [facility] @ [place]     [address] [phone]          [facility] [place]   [address] [phone]  [facility]  [address] [phone]     [facility])  [address]   [phone]     [facility]  Phone: [phone]  [facility]  Phone: [phone]  [facility] Phone: [phone]  [facility] Phone: [phone]  [facility] Phone: [phone]  [facility] Phone: [phone]  [facility]  Phone: [phone]  

## 2021-11-12 NOTE — Progress Notes (Signed)
  Amenorrhea with positive UPT PROBLEM  VISIT ENCOUNTER NOTE  Subjective:   April James is a 31 y.o. [phone] female here for positive pregnancy test.  She has not had serial bhcg.  No concerns today  LMP =Patient's last menstrual period was 09/06/2021 (exact date).  UPT positive  Denies abnormal vaginal bleeding, discharge, pelvic pain, problems with intercourse or other gynecologic concerns.    Gynecologic History Patient's last menstrual period was 09/06/2021 (exact date).  Health Maintenance Due  Topic Date Due   COVID-19 Vaccine (1) Never done   HIV Screening  Never done   Hepatitis C Screening  Never done   TETANUS/TDAP  Never done   PAP SMEAR-Modifier  09/28/2014   INFLUENZA VACCINE  Never done    The following portions of the patient's history were reviewed and updated as appropriate: allergies, current medications, past family history, past medical history, past social history, past surgical history and problem list.  Review of Systems Pertinent items are noted in HPI.   Objective:  BP 125/66   Pulse 79   Ht 5\' 2"  (1.575 m)   Wt 184 lb (83.5 kg)   LMP 09/06/2021 (Exact Date)   BMI 33.65 kg/m  Gen: well appearing, NAD HEENT: no scleral icterus CV: RR Lung: Normal WOB Ext: warm well perfused   Assessment and Plan:  1. Pregnancy test performed, pregnancy confirmed Recommended establishing care  Desires to come to [facility] RN to facilitate Reviewed return precautions and MAU   Future Appointments  Date Time Provider [place]  01/13/2022  9:15 AM Darlina Rumpf, CNM [facility]    Please refer to After Visit Summary for other counseling recommendations.   Return in about 4 weeks (around 12/08/2021) for Start prenatal care.  Caren Macadam, MD, MPH, ABFM Attending [place] for [facility]

## 2021-11-29 ENCOUNTER — Encounter (HOSPITAL_BASED_OUTPATIENT_CLINIC_OR_DEPARTMENT_OTHER): Payer: Self-pay

## 2021-11-29 ENCOUNTER — Emergency Department (HOSPITAL_BASED_OUTPATIENT_CLINIC_OR_DEPARTMENT_OTHER): Payer: Medicaid Other

## 2021-11-29 ENCOUNTER — Emergency Department (HOSPITAL_BASED_OUTPATIENT_CLINIC_OR_DEPARTMENT_OTHER)
Admission: EM | Admit: 2021-11-29 | Discharge: 2021-11-29 | Disposition: A | Discharge status: Home / Self Care | Payer: Medicaid Other | Attending: Emergency Medicine | Admitting: Emergency Medicine

## 2021-11-29 ENCOUNTER — Other Ambulatory Visit: Payer: Self-pay

## 2021-11-29 DIAGNOSIS — O219 Vomiting of pregnancy, unspecified: Secondary | ICD-10-CM | POA: Diagnosis not present

## 2021-11-29 DIAGNOSIS — M5442 Lumbago with sciatica, left side: Secondary | ICD-10-CM | POA: Diagnosis not present

## 2021-11-29 DIAGNOSIS — Z3A12 12 weeks gestation of pregnancy: Secondary | ICD-10-CM

## 2021-11-29 DIAGNOSIS — M5432 Sciatica, left side: Secondary | ICD-10-CM

## 2021-11-29 DIAGNOSIS — O26891 Other specified pregnancy related conditions, first trimester: Secondary | ICD-10-CM | POA: Diagnosis not present

## 2021-11-29 DIAGNOSIS — O210 Mild hyperemesis gravidarum: Secondary | ICD-10-CM | POA: Insufficient documentation

## 2021-11-29 DIAGNOSIS — O99891 Other specified diseases and conditions complicating pregnancy: Secondary | ICD-10-CM | POA: Diagnosis not present

## 2021-11-29 DIAGNOSIS — M549 Dorsalgia, unspecified: Secondary | ICD-10-CM | POA: Diagnosis not present

## 2021-11-29 NOTE — Discharge Instructions (Signed)
Recommend gentle stretching as discussed.  Limited use of heating pad, do not want to raise core temperature with a developing fetus.  Limited use of Tylenol as discussed.  For your morning sickness, you can try using vitamin B6 and doxylamine (brand name Unisom).  These medications are available over-the-counter, are the same as the prescribed medication Diclegis. Recommend taking at night.   Follow-up with OB as scheduled.

## 2021-11-29 NOTE — ED Provider Notes (Signed)
[facility] EMERGENCY DEPT Provider Note   CSN: [id number] Arrival date & time: 11/29/21  1108     History  Chief Complaint  Patient presents with   Emesis During Pregnancy   Back Pain    April James is a 31 y.o. female.  31 yo female presents at [redacted] weeks gestation based on dates, with complaint of right lower back pain radiating down left leg x 8 weeks, no falls or injuries. No dysuria. With nausea and vomiting, reports for the past week vomiting throughout the day, non bloody. Not taking anything for the vomiting. Denies bleeding, spotting, abnormal discharge. No prior abdominal surgeries.  G3P0 (1 miscarriage, 1 ab) Scheduled to see OB for first visit 01/13/22.        Home Medications Prior to Admission medications   Medication Sig Start Date End Date Taking? Authorizing Provider  acetaminophen (TYLENOL) 500 MG tablet Take 2 tablets (1,000 mg total) by mouth every 6 (six) hours as needed for mild pain, moderate pain or fever. For pain 05/05/15   Lindell Spar I, NP  VRAYLAR 3 MG CAPS Take 1 capsule by mouth at bedtime. 07/16/15   [provider]      Allergies    Penicillins and Sulfa antibiotics    Review of Systems   Review of Systems Negative except as per HPI Physical Exam Updated Vital Signs BP 116/73 (BP Location: Right Arm)   Pulse 74   Temp 97.8 F (36.6 C) (Oral)   Resp 18   Ht 5\' 2"  (1.575 m)   Wt 81.6 kg   LMP 09/06/2021 (Exact Date)   SpO2 99%   BMI 32.92 kg/m  Physical Exam Vitals and nursing note reviewed.  Constitutional:      General: She is not in acute distress.    Appearance: She is well-developed. She is not diaphoretic.  HENT:     Head: Normocephalic and atraumatic.  Cardiovascular:     Pulses: Normal pulses.  Pulmonary:     Effort: Pulmonary effort is normal.  Abdominal:     Palpations: Abdomen is soft.     Tenderness: There is no abdominal tenderness.  Musculoskeletal:        General: Tenderness  present. No swelling.       Back:     Comments: TTP left si, SLR + on left with left leg extension   Skin:    General: Skin is warm and dry.     Findings: No erythema or rash.  Neurological:     Mental Status: She is alert and oriented to person, place, and time.     Motor: No weakness.     Deep Tendon Reflexes: Babinski sign absent on the right side. Babinski sign absent on the left side.     Reflex Scores:      Patellar reflexes are 2+ on the right side and 2+ on the left side.      Achilles reflexes are 1+ on the right side and 1+ on the left side. Psychiatric:        Behavior: Behavior normal.     ED Results / Procedures / Treatments   Labs (all labs ordered are listed, but only abnormal results are displayed) Labs Reviewed - No data to display   EKG None  Radiology US OB Comp < 14 Wks  Result Date: 11/29/2021 CLINICAL DATA:  Back pain EXAM: OBSTETRIC <14 WK [place] AND TRANSVAGINAL OB US TECHNIQUE: Both transabdominal and transvaginal ultrasound examinations were performed for complete  evaluation of the gestation as well as the maternal uterus, adnexal regions, and pelvic cul-de-sac. Transvaginal technique was performed to assess early pregnancy. COMPARISON:  None Available. FINDINGS: Intrauterine gestational sac: Single Yolk sac:  Not Visualized. Embryo:  Visualized. Cardiac Activity: Visualized. Heart Rate: 155 bpm CRL:  55.1 mm   12 w   1 d                  [place] EDC: 06/12/2022 Subchorionic hemorrhage:  None visualized. Maternal uterus/adnexae: Normal appearance of bilateral ovaries. No free fluid seen in the pelvis. IMPRESSION: Single viable intrauterine pregnancy with estimated gestational age of [redacted] weeks 1 day. Electronically Signed   By: Allegra Lai M.D.   On: 11/29/2021 12:35    Procedures Procedures    Medications Ordered in ED Medications - No data to display  ED Course/ Medical Decision Making/ A&P                           Medical Decision Making Amount  and/or Complexity of Data Reviewed Labs: ordered. Radiology: ordered.   31 year old female presents with complaint of left lower back pain which radiates down her left leg.  Patient is [redacted] weeks pregnant by dates, prenatal care scheduled to start in December, has been unable to receive care any earlier and is nervous about this.  She is not having vaginal bleeding or discharge, no urinary symptoms.  No falls or injuries.  She is found to have tenderness of the left SI which is reproduced with palpation and with straight leg extension of the left leg.  Abdomen is soft and nontender.  Ultrasound obtained today to confirm IUP shows a single IUP at approximate 12 weeks and 1 day with positive cardiac activity.  Discussed management of her pain, stretching encouraged, limited use of Tylenol, did discuss concerns in literature for ADHD or autism related to Tylenol use.  Very limited use of heating pad to area, discussed not wanting to increase core temperature.  In regards to her ongoing nausea and vomiting, she appears well-hydrated today.  Will recommend B6 and Unisom. Blood pressure elevated on arrival, normal at repeat time of discharge 116/73.        Final Clinical Impression(s) / ED Diagnoses Final diagnoses:  [redacted] weeks gestation of pregnancy  Sciatica of left side  Morning sickness    Rx / DC Orders ED Discharge Orders     None         Jeannie Fend, PA-C 11/29/21 1333    Tegeler, Canary Brim, MD 11/29/21 1622

## 2021-11-29 NOTE — ED Triage Notes (Addendum)
Pt is [redacted] weeks pregnant and has been nauseous the entire time. Still able to eat and drink fine Back pain started this am radiating down left leg 1st OB-GYN appt 01/13/22

## 2021-11-29 NOTE — ED Notes (Signed)
MD at bedside. [place] labs. Boyfriend at bedside as well. Pain improved and no c/o emesis for me. RN

## 2022-01-10 ENCOUNTER — Ambulatory Visit (HOSPITAL_BASED_OUTPATIENT_CLINIC_OR_DEPARTMENT_OTHER): Payer: Medicaid Other | Admitting: Family Medicine

## 2022-01-13 ENCOUNTER — Encounter: Payer: Self-pay | Admitting: Advanced Practice Midwife

## 2022-01-13 ENCOUNTER — Ambulatory Visit (INDEPENDENT_AMBULATORY_CARE_PROVIDER_SITE_OTHER): Payer: Medicaid Other | Admitting: Advanced Practice Midwife

## 2022-01-13 ENCOUNTER — Other Ambulatory Visit (HOSPITAL_COMMUNITY)
Admission: RE | Admit: 2022-01-13 | Discharge: 2022-01-13 | Disposition: A | Discharge status: Home / Self Care | Payer: Medicaid Other | Source: Ambulatory Visit | Attending: Advanced Practice Midwife | Admitting: Advanced Practice Midwife

## 2022-01-13 VITALS — BP 122/73 | HR 81 | Wt 185.0 lb

## 2022-01-13 DIAGNOSIS — O0990 Supervision of high risk pregnancy, unspecified, unspecified trimester: Secondary | ICD-10-CM | POA: Insufficient documentation

## 2022-01-13 DIAGNOSIS — Z7689 Persons encountering health services in other specified circumstances: Secondary | ICD-10-CM

## 2022-01-13 DIAGNOSIS — O0992 Supervision of high risk pregnancy, unspecified, second trimester: Secondary | ICD-10-CM

## 2022-01-13 DIAGNOSIS — Z3A18 18 weeks gestation of pregnancy: Secondary | ICD-10-CM

## 2022-01-13 DIAGNOSIS — F319 Bipolar disorder, unspecified: Secondary | ICD-10-CM

## 2022-01-13 DIAGNOSIS — Z23 Encounter for immunization: Secondary | ICD-10-CM | POA: Diagnosis not present

## 2022-01-13 DIAGNOSIS — Z720 Tobacco use: Secondary | ICD-10-CM | POA: Insufficient documentation

## 2022-01-13 DIAGNOSIS — O99330 Smoking (tobacco) complicating pregnancy, unspecified trimester: Secondary | ICD-10-CM | POA: Insufficient documentation

## 2022-01-13 DIAGNOSIS — F17200 Nicotine dependence, unspecified, uncomplicated: Secondary | ICD-10-CM

## 2022-01-13 DIAGNOSIS — O99332 Smoking (tobacco) complicating pregnancy, second trimester: Secondary | ICD-10-CM

## 2022-01-13 MED ORDER — HYDROXYZINE PAMOATE 25 MG PO CAPS: 25.0000 mg | ORAL_CAPSULE | Freq: Three times a day (TID) | ORAL | 0 refills | Status: DC | PRN

## 2022-01-13 MED ORDER — ONDANSETRON 4 MG PO TBDP: 4.0000 mg | ORAL_TABLET | Freq: Four times a day (QID) | ORAL | 1 refills | Status: DC | PRN

## 2022-01-13 MED ORDER — NICOTINE 14 MG/24HR TD PT24: 14.0000 mg | MEDICATED_PATCH | Freq: Every day | TRANSDERMAL | 0 refills | Status: DC

## 2022-01-13 MED ORDER — ASPIRIN 81 MG PO TBEC: 81.0000 mg | DELAYED_RELEASE_TABLET | Freq: Every day | ORAL | 12 refills | Status: DC

## 2022-01-13 NOTE — Progress Notes (Addendum)
NOB  Last pap:1-2 yrs No hx of abnormal pap Genetic Screening: Desires  Flu Vaccine: Will Receive / pt left w/o getting vaccine.  Notes being a tobacco smoker but has cut down since pregnancy.  CC: None   *Pt had miscommunication with getting this appt scheduled pt is very anxious. Pt has not seen baby on U/S in a while.   PHQ-9 =11 GAD 7 =19

## 2022-01-13 NOTE — Progress Notes (Signed)
History:   April James is a 31 y.o. [id number] at [redacted]w[redacted]d by early ultrasound being seen today for her first obstetrical visit.  Her obstetrical history is significant for smoker, Bipolar disorder, hx of vaginal delivery. Patient does intend to breast feed. Pregnancy history fully reviewed.  Patient reports  mild but persistent anxiety 2/2 having to wait so long for her first prenatal appointment . She is accompanied by her partner Tasia Catchings.   Patient previously managed her Bipolar disorder with Latuda, discontinued 1-2 years ago. She feels her pregnancy and lack of surveillance are stimulating anxiety and she is interested in possibly restarting medication.  Patient smokes cigarettes (0.5-1 PPD). She previously smoked 2 PPD but cut down to current level when she learned she was pregnant. She requests nicotine patch     HISTORY: OB History  Gravida Para Term Preterm AB Living  4 1 1  0 2 1  SAB IAB Ectopic Multiple Live Births  1 1 0 0 1    # Outcome Date GA Lbr Len/2nd Weight Sex Delivery Anes PTL Lv  4 Current           3 SAB 2023          2 IAB 2015          1 Term 12/20/08    M Vag-Spont   LIV    Last pap smear was done 2013 and was normal.  Past Medical History:  Diagnosis Date   Asthma    Bipolar 1 disorder (HCC)    Past Surgical History:  Procedure Laterality Date   CHOLECYSTECTOMY     TONSILLECTOMY     Family History  Problem Relation Age of Onset   Bipolar disorder Father    Heart disease Father    Aneurysm Father    Social History   Tobacco Use   Smoking status: Every Day    Packs/day: 0.20    Years: 16.00    Total pack years: 3.20    Types: Cigarettes    Passive exposure: Current   Smokeless tobacco: Never  Vaping Use   Vaping Use: Never used  Substance Use Topics   Alcohol use: Not Currently    Comment: stopped for now   Drug use: Yes    Types: Marijuana    Comment: "Not very often". Last used: 2 months ago.    Allergies  Allergen Reactions    Penicillins Hives and Shortness Of Breath    Has patient had a PCN reaction causing immediate rash, facial/tongue/throat swelling, SOB or lightheadedness with hypotension: No Has patient had a PCN reaction causing severe rash involving mucus membranes or skin necrosis: no Has patient had a PCN reaction that required hospitalization: no Has patient had a PCN reaction occurring within the last 10 years: no If all of the above answers are "NO", then may proceed with Cephalosporin use.    Sulfa Antibiotics Rash   Current Outpatient Medications on File Prior to Visit  Medication Sig Dispense Refill   acetaminophen (TYLENOL) 500 MG tablet Take 2 tablets (1,000 mg total) by mouth every 6 (six) hours as needed for mild pain, moderate pain or fever. For pain 30 tablet 0   VRAYLAR 3 MG CAPS Take 1 capsule by mouth at bedtime. (Patient not taking: Reported on 01/13/2022)  1   No current facility-administered medications on file prior to visit.    Review of Systems Pertinent items noted in HPI and remainder of comprehensive ROS otherwise negative. Physical Exam:  Vitals:   01/13/22 0935  BP: 122/73  Pulse: 81  Weight: 185 lb (83.9 kg)   Fetal Heart Rate (bpm): 152 Uterus:     Pelvic Exam: Perineum: no hemorrhoids, normal perineum   Vulva: normal external genitalia, no lesions   Vagina:  normal mucosa, normal discharge   Cervix: no lesions and normal, pap smear done.    Adnexa: normal adnexa and no mass, fullness, tenderness   Bony Pelvis: average  System: General: well-developed, well-nourished female in no acute distress   Breasts:  normal appearance, no masses or tenderness bilaterally   Skin: normal coloration and turgor, no rashes   Neurologic: oriented, normal, negative, normal mood   Extremities: normal strength, tone, and muscle mass, ROM of all joints is normal   HEENT PERRLA, extraocular movement intact and sclera clear, anicteric   Mouth/Teeth mucous membranes moist, pharynx  normal without lesions and poor dentition   Neck supple and no masses   Cardiovascular: regular rate and rhythm   Respiratory:  no respiratory distress, normal breath sounds   Abdomen: soft, non-tender; bowel sounds normal; no masses,  no organomegaly   Assessment:    Pregnancy: [id number] Patient Active Problem List   Diagnosis Date Noted   Supervision of high risk pregnancy, antepartum 01/13/2022   Tobacco use affecting pregnancy in second trimester, antepartum 01/13/2022   Bipolar affective disorder, currently depressed, moderate (HCC)    Bipolar disorder with current episode depressed (HCC) 05/03/2015   Alcohol use disorder, mild, abuse 05/03/2015   Cannabis use disorder, mild, abuse 05/03/2015      Indications for ASA therapy (per uptodate)  Two or more of the following: Nulliparity No Obesity (body mass index >30 kg/m2) Yes Family history of preeclampsia in mother or sister No Age ?35 years No Sociodemographic characteristics (African American race, low socioeconomic level) Yes Personal risk factors (eg, previous pregnancy with low birth weight or small for gestational age infant, previous adverse pregnancy outcome [eg, stillbirth], interval >10 years between pregnancies) No  Plan:    1. Supervision of high risk pregnancy, antepartum - Welcome to practice!  - CBC/D/Plt+RPR+Rh+ABO+RubIgG... - Culture, OB Urine - Hemoglobin A1c - AFP, Serum, Open Spina Bifida - Cytology - PAP - [place] MFM OB DETAIL +14 WK; Future - aspirin EC 81 MG tablet; Take 1 tablet (81 mg total) by mouth daily. Swallow whole.  Dispense: 30 tablet; Refill: 12 - SPECIAL REPORTS OR FORMS FORMS15 - PANORAMA PRENATAL TEST FULL PANEL - HORIZON CUSTOM  2. Bipolar affective disorder, remission status unspecified (HCC) - Can initiate Vistaril specifically for reported mild anxiety - Will refer to [facility], who can help determine is consult to psych is indicated for med management - hydrOXYzine (VISTARIL) 25 MG  capsule; Take 1 capsule (25 mg total) by mouth 3 (three) times daily as needed.  Dispense: 30 capsule; Refill: 0 - ondansetron (ZOFRAN-ODT) 4 MG disintegrating tablet; Take 1 tablet (4 mg total) by mouth every 6 (six) hours as needed for nausea.  Dispense: 30 tablet; Refill: 1 - Ambulatory referral to Integrated [facility]  3. [redacted] weeks gestation of pregnancy   4. Current every day smoker - Patch ordered today - Discussed that she will likely trigger syncopal episode if she smokes while wearing patch   5. Flu vaccine need  - Flu Vaccine QUAD 36+ mos IM (Fluarix, Quad PF)  6. Tobacco use affecting pregnancy in second trimester, antepartum  - nicotine (NICODERM CQ - DOSED IN MG/24 HOURS) 14 mg/24hr patch; Place 1 patch (  14 mg total) onto the skin daily.  Dispense: 28 patch; Refill: 0  7. Poor dentition requiring referral to dentistry - Given DDS references, confirmed basic dental care is safe in all trimesters   Initial labs drawn. Continue prenatal vitamins. Genetic Screening discussed, First trimester screen, Quad screen, and NIPS: ordered. Ultrasound discussed; fetal anatomic survey: ordered. Problem list reviewed and updated. The nature of [facility] with multiple MDs and other Advanced Practice Providers was explained to patient; also emphasized that residents, students are part of our team. Routine obstetric precautions reviewed. Return in about 4 weeks (around 02/10/2022) for MD or APP.     Clayton Bibles, MSN, CNM Certified Nurse Midwife, [employer] for [employer], [facility] 01/13/22 11:40 AM

## 2022-01-15 LAB — CBC/D/PLT+RPR+RH+ABO+RUBIGG...
Antibody Screen: NEGATIVE
Basophils Absolute: 0.1 10*3/uL (ref 0.0–0.2)
Basos: 0 %
EOS (ABSOLUTE): 0.1 10*3/uL (ref 0.0–0.4)
Eos: 1 %
HCV Ab: NONREACTIVE
HIV Screen 4th Generation wRfx: NONREACTIVE
Hematocrit: 39.8 % (ref 34.0–46.6)
Hemoglobin: 13.2 g/dL (ref 11.1–15.9)
Hepatitis B Surface Ag: NEGATIVE
Immature Grans (Abs): 0 10*3/uL (ref 0.0–0.1)
Immature Granulocytes: 0 %
Lymphocytes Absolute: 2.8 10*3/uL (ref 0.7–3.1)
Lymphs: 22 %
MCH: 31.2 pg (ref 26.6–33.0)
MCHC: 33.2 g/dL (ref 31.5–35.7)
MCV: 94 fL (ref 79–97)
Monocytes Absolute: 0.5 10*3/uL (ref 0.1–0.9)
Monocytes: 4 %
Neutrophils Absolute: 9.4 10*3/uL — ABNORMAL HIGH (ref 1.4–7.0)
Neutrophils: 73 %
Platelets: 439 10*3/uL (ref 150–450)
RBC: 4.23 x10E6/uL (ref 3.77–5.28)
RDW: 11.8 % (ref 11.7–15.4)
RPR Ser Ql: NONREACTIVE
Rh Factor: POSITIVE
Rubella Antibodies, IGG: 2.97 index (ref >0.99)
WBC: 12.9 10*3/uL — ABNORMAL HIGH (ref 3.4–10.8)

## 2022-01-15 LAB — AFP, SERUM, OPEN SPINA BIFIDA
AFP MoM: 0.99
AFP Value: 39.9 ng/mL
Gest. Age on Collection Date: 18.3 weeks
Maternal Age At EDD: 31.3 yr
OSBR Risk 1 IN: 10000
Test Results:: NEGATIVE
Weight: 185 [lb_av]

## 2022-01-15 LAB — HEMOGLOBIN A1C
Est. average glucose Bld gHb Est-mCnc: 103 mg/dL
Hgb A1c MFr Bld: 5.2 % (ref 4.8–5.6)

## 2022-01-15 LAB — HCV INTERPRETATION

## 2022-01-15 LAB — URINE CULTURE, OB REFLEX

## 2022-01-15 LAB — CULTURE, OB URINE

## 2022-01-18 LAB — CYTOLOGY - PAP
Chlamydia: POSITIVE — AB
Comment: NEGATIVE
Comment: NEGATIVE
Comment: NEGATIVE
Comment: NORMAL
Diagnosis: NEGATIVE
Diagnosis: REACTIVE
High risk HPV: NEGATIVE
Neisseria Gonorrhea: NEGATIVE
Trichomonas: NEGATIVE

## 2022-01-19 ENCOUNTER — Encounter: Payer: Self-pay | Admitting: Advanced Practice Midwife

## 2022-01-19 ENCOUNTER — Other Ambulatory Visit: Payer: Self-pay | Admitting: *Deleted

## 2022-01-19 DIAGNOSIS — A749 Chlamydial infection, unspecified: Secondary | ICD-10-CM | POA: Insufficient documentation

## 2022-01-19 HISTORY — DX: Chlamydial infection, unspecified: A74.9

## 2022-01-19 MED ORDER — AZITHROMYCIN 500 MG PO TABS: 1000.0000 mg | ORAL_TABLET | Freq: Once | ORAL | 1 refills | Status: AC

## 2022-01-25 ENCOUNTER — Encounter: Payer: Self-pay | Admitting: Advanced Practice Midwife

## 2022-01-31 NOTE — L&D Delivery Note (Signed)
OB/GYN Faculty Practice Delivery Note  April James is a 32 y.o. [id number] s/p SVD at [redacted]w[redacted]d. She was admitted for IOL IUGR.   ROM: 10h 55m with thick meconium fluid GBS Status:  Negative/-- (04/16 1640) Maximum Maternal Temperature: 64F  Labor Progress: Initial SVE: 2/50/-3. She then progressed to complete.   Delivery Date/Time: 05/25/22 1539 Delivery: Called to room and patient was complete and pushing. Head delivered OA. Nuchal cord present x1, reduced. Shoulder and body delivered in usual fashion. Infant with spontaneous cry, placed on mother's abdomen, dried and stimulated. Cord clamped x 2 after 1-minute delay, and cut by FOB. Cord blood drawn. Placenta delivered spontaneously with gentle cord traction. Fundus firm with massage and Pitocin. Labia, perineum, vagina, and cervix inspected with no lacerations noted.  Baby Weight: pending  Placenta: 3 vessel, intact. Sent to L&D Complications: None Lacerations: none EBL: 326 mL Analgesia: Epidural   Infant:  APGAR (1 MIN): 8   APGAR (5 MINS): 9    Myrtie Hawk, DO OB Family Medicine Fellow, [facility] for [facility], [facility] 05/25/2022, 5:07 PM

## 2022-02-01 ENCOUNTER — Ambulatory Visit: Payer: Medicaid Other | Admitting: Licensed Clinical Social Worker

## 2022-02-01 DIAGNOSIS — Z91199 Patient's noncompliance with other medical treatment and regimen due to unspecified reason: Secondary | ICD-10-CM

## 2022-02-01 NOTE — BH Specialist Note (Signed)
Pt signed on before start of visit LCSW A Linton Rump called pt and left message.

## 2022-02-02 LAB — PANORAMA PRENATAL TEST FULL PANEL:PANORAMA TEST PLUS 5 ADDITIONAL MICRODELETIONS: FETAL FRACTION: 6.3

## 2022-02-02 LAB — HORIZON CUSTOM: REPORT SUMMARY: NEGATIVE

## 2022-02-04 ENCOUNTER — Ambulatory Visit (INDEPENDENT_AMBULATORY_CARE_PROVIDER_SITE_OTHER): Payer: Medicaid Other | Admitting: Licensed Clinical Social Worker

## 2022-02-04 ENCOUNTER — Other Ambulatory Visit: Payer: Self-pay | Admitting: Advanced Practice Midwife

## 2022-02-04 DIAGNOSIS — F32A Depression, unspecified: Secondary | ICD-10-CM | POA: Diagnosis not present

## 2022-02-04 DIAGNOSIS — F319 Bipolar disorder, unspecified: Secondary | ICD-10-CM

## 2022-02-05 NOTE — BH Specialist Note (Signed)
Integrated Behavioral Health Initial In-Person Visit  MRN: [id number] Name: April James  Number of [place] Clinician visits: 1 Session Start time:   8:35am Session End time: 9:30am Total time in minutes: 55 mins in person at [facility]   Types of Service: Individual psychotherapy  Interpretor:No. Interpretor Name and Language: none   Warm Hand Off Completed.        Subjective: April James is a 32 y.o. female accompanied by n/a Patient was referred by [place] for hx of bipolar disorder. Patient reports the following symptoms/concerns: depressed mood, bipolar disorder, feeling overwhelmed, limited family support, negative thought patterns Duration of problem: over one year; Severity of problem: mild  Objective: Mood: good and Affect: Appropriate Risk of harm to self or others: No plan to harm self or others  Life Context: Family and Social: Lives with grandfather and son  School/Work: unemp  Self-Care: none Life Changes: new pregnancy  Patient and/or Family's Strengths/Protective Factors: Sense of purpose  Goals Addressed: Patient will: Reduce symptoms of: depression mood instability Increase knowledge and/or ability of: coping skills and self-management skills  Demonstrate ability to: Increase healthy adjustment to current life circumstances  Progress towards Goals: Ongoing  Interventions: Interventions utilized: Motivational Interviewing and Supportive Counseling  Standardized Assessments completed: Not Needed  Patient and/or Family Response: April James and April James develop  a plan for continued education, job training and coping skills. Discussed people pleasing and reversing negative thought patterns    Assessment: Patient currently experiencing depression.   Patient may benefit from integrated behavioral health.  Plan: Follow up with behavioral health clinician on : 2-3 weeks  Behavioral recommendations: Collaborate  with educational resources given during appt, set boundaries, counteract negative thought patterns  Referral(s): [place] (In Clinic) "From scale of 1-10, how likely are you to follow plan?":    Lynnea Ferrier, LCSW

## 2022-02-07 MED ORDER — HYDROXYZINE PAMOATE 25 MG PO CAPS: 25.0000 mg | ORAL_CAPSULE | Freq: Three times a day (TID) | ORAL | 0 refills | Status: DC | PRN

## 2022-02-10 ENCOUNTER — Encounter: Payer: Medicaid Other | Admitting: Advanced Practice Midwife

## 2022-02-11 ENCOUNTER — Telehealth: Payer: Self-pay

## 2022-02-11 NOTE — Telephone Encounter (Signed)
Left message for pt to call office back regarding missed appointment.

## 2022-02-15 ENCOUNTER — Ambulatory Visit (INDEPENDENT_AMBULATORY_CARE_PROVIDER_SITE_OTHER): Payer: Medicaid Other | Admitting: Obstetrics and Gynecology

## 2022-02-15 VITALS — BP 118/72 | HR 96 | Wt 199.0 lb

## 2022-02-15 DIAGNOSIS — F3132 Bipolar disorder, current episode depressed, moderate: Secondary | ICD-10-CM

## 2022-02-15 DIAGNOSIS — O0990 Supervision of high risk pregnancy, unspecified, unspecified trimester: Secondary | ICD-10-CM

## 2022-02-15 DIAGNOSIS — Z3A23 23 weeks gestation of pregnancy: Secondary | ICD-10-CM

## 2022-02-15 DIAGNOSIS — O98812 Other maternal infectious and parasitic diseases complicating pregnancy, second trimester: Secondary | ICD-10-CM

## 2022-02-15 DIAGNOSIS — A749 Chlamydial infection, unspecified: Secondary | ICD-10-CM

## 2022-02-15 DIAGNOSIS — O0992 Supervision of high risk pregnancy, unspecified, second trimester: Secondary | ICD-10-CM

## 2022-02-15 NOTE — Progress Notes (Signed)
ROB   +CT on 12/23 TOC next visit.

## 2022-02-15 NOTE — Progress Notes (Signed)
   PRENATAL VISIT NOTE  Subjective:  April James is a 32 y.o. [id number] at [redacted]w[redacted]d being seen today for ongoing prenatal care.  She is currently monitored for the following issues for this high-risk pregnancy and has Bipolar disorder with current episode depressed ([place]); Alcohol use disorder, mild, abuse; Cannabis use disorder, mild, abuse; Bipolar affective disorder, currently depressed, moderate ([place]); Supervision of high risk pregnancy, antepartum; Tobacco use affecting pregnancy in second trimester, antepartum; and Chlamydia infection affecting pregnancy in second trimester on their problem list.  Patient reports no complaints.  Contractions: Not present. Vag. Bleeding: None.  Movement: Present. Denies leaking of fluid.   The following portions of the patient's history were reviewed and updated as appropriate: allergies, current medications, past family history, past medical history, past social history, past surgical history and problem list.   Objective:   Vitals:   02/15/22 1440  BP: 118/72  Pulse: 96  Weight: 199 lb (90.3 kg)    Fetal Status: Fetal Heart Rate (bpm): 148 Fundal Height: 23 cm Movement: Present     General:  Alert, oriented and cooperative. Patient is in no acute distress.  Skin: Skin is warm and dry. No rash noted.   Cardiovascular: Normal heart rate noted  Respiratory: Normal respiratory effort, no problems with respiration noted  Abdomen: Soft, gravid, appropriate for gestational age.  Pain/Pressure: Absent     Pelvic: Cervical exam deferred        Extremities: Normal range of motion.  Edema: None  Mental Status: Normal mood and affect. Normal behavior. Normal judgment and thought content.   Assessment and Plan:  Pregnancy: G4P1021 at [redacted]w[redacted]d 1. Supervision of high risk pregnancy, antepartum GTT next visit  2. Bipolar affective disorder, currently depressed, moderate ([place]) Followed by Seth Bake. Currently taking vistaril tid. Pt was on Latuda in the past before  insurance coverage. Will send message to Seth Bake about referring her to a place that can do medication management, as well.   3. Chlamydia infection affecting pregnancy in second trimester Toc next visit  4. [redacted] weeks gestation of pregnancy F/u anatomy u/s tomorrow.   Preterm labor symptoms and general obstetric precautions including but not limited to vaginal bleeding, contractions, leaking of fluid and fetal movement were reviewed in detail with the patient. Please refer to After Visit Summary for other counseling recommendations.   Return in about 3 weeks (around 03/08/2022) for in person, fasting 2hr GTT, low risk ob.  Future Appointments  Date Time Provider [place]  02/16/2022  1:30 PM [facility] NURSE [facility]  02/16/2022  1:45 PM [facility] US5 [facility]  02/25/2022  9:00 AM Lynnea Ferrier, LCSW [facility] None  03/08/2022  8:15 AM [facility]  03/08/2022  8:55 AM Aletha Halim, MD [facility]  04/05/2022  3:30 PM Anyanwu, Sallyanne Havers, MD [facility]    Aletha Halim, MD

## 2022-02-16 ENCOUNTER — Ambulatory Visit: Payer: Medicaid Other | Admitting: *Deleted

## 2022-02-16 ENCOUNTER — Ambulatory Visit: Payer: Medicaid Other | Attending: Maternal & Fetal Medicine

## 2022-02-16 ENCOUNTER — Other Ambulatory Visit: Payer: Self-pay | Admitting: *Deleted

## 2022-02-16 VITALS — BP 119/50 | HR 79

## 2022-02-16 DIAGNOSIS — Z3A23 23 weeks gestation of pregnancy: Secondary | ICD-10-CM

## 2022-02-16 DIAGNOSIS — O0990 Supervision of high risk pregnancy, unspecified, unspecified trimester: Secondary | ICD-10-CM

## 2022-02-16 DIAGNOSIS — F17210 Nicotine dependence, cigarettes, uncomplicated: Secondary | ICD-10-CM

## 2022-02-16 DIAGNOSIS — F1910 Other psychoactive substance abuse, uncomplicated: Secondary | ICD-10-CM

## 2022-02-16 DIAGNOSIS — O99212 Obesity complicating pregnancy, second trimester: Secondary | ICD-10-CM

## 2022-02-16 DIAGNOSIS — Z363 Encounter for antenatal screening for malformations: Secondary | ICD-10-CM | POA: Diagnosis not present

## 2022-02-16 DIAGNOSIS — E669 Obesity, unspecified: Secondary | ICD-10-CM | POA: Diagnosis not present

## 2022-02-16 DIAGNOSIS — O99332 Smoking (tobacco) complicating pregnancy, second trimester: Secondary | ICD-10-CM

## 2022-02-16 DIAGNOSIS — Z362 Encounter for other antenatal screening follow-up: Secondary | ICD-10-CM

## 2022-02-16 DIAGNOSIS — Z720 Tobacco use: Secondary | ICD-10-CM

## 2022-02-16 DIAGNOSIS — O99322 Drug use complicating pregnancy, second trimester: Secondary | ICD-10-CM | POA: Diagnosis not present

## 2022-02-23 ENCOUNTER — Other Ambulatory Visit: Payer: Self-pay | Admitting: Advanced Practice Midwife

## 2022-02-23 DIAGNOSIS — F319 Bipolar disorder, unspecified: Secondary | ICD-10-CM

## 2022-02-24 MED ORDER — HYDROXYZINE PAMOATE 25 MG PO CAPS: 25.0000 mg | ORAL_CAPSULE | Freq: Three times a day (TID) | ORAL | 0 refills | Status: DC | PRN

## 2022-02-25 ENCOUNTER — Ambulatory Visit: Payer: Medicaid Other | Admitting: Licensed Clinical Social Worker

## 2022-02-25 ENCOUNTER — Telehealth: Payer: Self-pay | Admitting: Licensed Clinical Social Worker

## 2022-02-25 NOTE — Telephone Encounter (Signed)
Called pt regarding scheduled IBH schedule visit. Left message requesting callback.

## 2022-03-08 ENCOUNTER — Other Ambulatory Visit: Payer: Medicaid Other

## 2022-03-08 ENCOUNTER — Ambulatory Visit (INDEPENDENT_AMBULATORY_CARE_PROVIDER_SITE_OTHER): Payer: Medicaid Other | Admitting: Obstetrics and Gynecology

## 2022-03-08 ENCOUNTER — Other Ambulatory Visit (HOSPITAL_COMMUNITY)
Admission: RE | Admit: 2022-03-08 | Discharge: 2022-03-08 | Disposition: A | Discharge status: Home / Self Care | Payer: Medicaid Other | Source: Ambulatory Visit | Attending: Obstetrics and Gynecology | Admitting: Obstetrics and Gynecology

## 2022-03-08 VITALS — BP 120/70 | HR 76 | Wt 198.0 lb

## 2022-03-08 DIAGNOSIS — A749 Chlamydial infection, unspecified: Secondary | ICD-10-CM | POA: Diagnosis not present

## 2022-03-08 DIAGNOSIS — O98812 Other maternal infectious and parasitic diseases complicating pregnancy, second trimester: Secondary | ICD-10-CM

## 2022-03-08 DIAGNOSIS — O99342 Other mental disorders complicating pregnancy, second trimester: Secondary | ICD-10-CM

## 2022-03-08 DIAGNOSIS — Z3A26 26 weeks gestation of pregnancy: Secondary | ICD-10-CM

## 2022-03-08 DIAGNOSIS — O99210 Obesity complicating pregnancy, unspecified trimester: Secondary | ICD-10-CM | POA: Insufficient documentation

## 2022-03-08 DIAGNOSIS — Z6836 Body mass index (BMI) 36.0-36.9, adult: Secondary | ICD-10-CM

## 2022-03-08 DIAGNOSIS — O0992 Supervision of high risk pregnancy, unspecified, second trimester: Secondary | ICD-10-CM

## 2022-03-08 DIAGNOSIS — O0990 Supervision of high risk pregnancy, unspecified, unspecified trimester: Secondary | ICD-10-CM

## 2022-03-08 DIAGNOSIS — O99212 Obesity complicating pregnancy, second trimester: Secondary | ICD-10-CM

## 2022-03-08 DIAGNOSIS — F319 Bipolar disorder, unspecified: Secondary | ICD-10-CM

## 2022-03-08 NOTE — Progress Notes (Signed)
CC: routine OB

## 2022-03-08 NOTE — Progress Notes (Signed)
   PRENATAL VISIT NOTE  Subjective:  April James is a 32 y.o. K2H0623 at [redacted]w[redacted]d being seen today for ongoing prenatal care.  She is currently monitored for the following issues for this high-risk pregnancy and has Bipolar disorder with current episode depressed ([place]); Alcohol use disorder, mild, abuse; Cannabis use disorder, mild, abuse; Bipolar affective disorder, currently depressed, moderate ([place]); Supervision of high risk pregnancy, antepartum; Tobacco use affecting pregnancy in second trimester, antepartum; Chlamydia infection affecting pregnancy in second trimester; Obesity in pregnancy; Bipolar disease in pregnancy in second trimester ([place]); and BMI 36.0-36.9,adult on their problem list.  Patient reports no complaints.  Contractions: Not present. Vag. Bleeding: None.  Movement: Present. Denies leaking of fluid.   The following portions of the patient's history were reviewed and updated as appropriate: allergies, current medications, past family history, past medical history, past social history, past surgical history and problem list.   Objective:   Vitals:   03/08/22 0856  BP: 120/70  Pulse: 76  Weight: 198 lb (89.8 kg)    Fetal Status: Fetal Heart Rate (bpm): 152   Movement: Present     General:  Alert, oriented and cooperative. Patient is in no acute distress.  Skin: Skin is warm and dry. No rash noted.   Cardiovascular: Normal heart rate noted  Respiratory: Normal respiratory effort, no problems with respiration noted  Abdomen: Soft, gravid, appropriate for gestational age.  Pain/Pressure: Absent     Pelvic: Cervical exam deferred        Extremities: Normal range of motion.  Edema: None  Mental Status: Normal mood and affect. Normal behavior. Normal judgment and thought content.   Assessment and Plan:  Pregnancy: G4P1021 at [redacted]w[redacted]d 1. [redacted] weeks gestation of pregnancy 28wk labs today. F/u completion anatomy u/s - Cervicovaginal ancillary only( [place])  2. Chlamydia  infection affecting pregnancy in second trimester Toc today - Cervicovaginal ancillary only( [place])  3. Supervision of high risk pregnancy, antepartum  4. Bipolar disease in pregnancy in second trimester [facility]) Doing well on prn vistaril. Pt states she needs to contact andrea again about seeing a provider to potentially go back on Latuda.   5. BMI 36.0-36.9,adult Weight stable  6. Obesity in pregnancy  Preterm labor symptoms and general obstetric precautions including but not limited to vaginal bleeding, contractions, leaking of fluid and fetal movement were reviewed in detail with the patient. Please refer to After Visit Summary for other counseling recommendations.   No follow-ups on file.  Future Appointments  Date Time Provider [place]  03/17/2022 12:45 PM [facility] NURSE [facility]  03/17/2022  1:00 PM [facility] US1 [facility]  04/05/2022  3:30 PM Anyanwu, Sallyanne Havers, MD [facility]  04/19/2022  4:10 PM Anyanwu, Sallyanne Havers, MD [facility]    Aletha Halim, MD

## 2022-03-09 LAB — GLUCOSE TOLERANCE, 2 HOURS W/ 1HR
Glucose, 1 hour: 95 mg/dL (ref 70–179)
Glucose, 2 hour: 64 mg/dL — ABNORMAL LOW (ref 70–152)
Glucose, Fasting: 84 mg/dL (ref 70–91)

## 2022-03-09 LAB — CERVICOVAGINAL ANCILLARY ONLY
Bacterial Vaginitis (gardnerella): POSITIVE — AB
Candida Glabrata: POSITIVE — AB
Candida Vaginitis: NEGATIVE
Chlamydia: NEGATIVE
Comment: NEGATIVE
Comment: NEGATIVE
Comment: NEGATIVE
Comment: NEGATIVE
Comment: NEGATIVE
Comment: NORMAL
Neisseria Gonorrhea: NEGATIVE
Trichomonas: NEGATIVE

## 2022-03-09 LAB — CBC
Hematocrit: 37.6 % (ref 34.0–46.6)
Hemoglobin: 12.9 g/dL (ref 11.1–15.9)
MCH: 32.5 pg (ref 26.6–33.0)
MCHC: 34.3 g/dL (ref 31.5–35.7)
MCV: 95 fL (ref 79–97)
Platelets: 434 10*3/uL (ref 150–450)
RBC: 3.97 x10E6/uL (ref 3.77–5.28)
RDW: 11.8 % (ref 11.7–15.4)
WBC: 13.9 10*3/uL — ABNORMAL HIGH (ref 3.4–10.8)

## 2022-03-09 LAB — RPR: RPR Ser Ql: NONREACTIVE

## 2022-03-09 LAB — HIV ANTIBODY (ROUTINE TESTING W REFLEX): HIV Screen 4th Generation wRfx: NONREACTIVE

## 2022-03-09 MED ORDER — MICONAZOLE NITRATE 2 % VA CREA: 1.0000 | TOPICAL_CREAM | Freq: Every day | VAGINAL | 2 refills | Status: DC

## 2022-03-09 MED ORDER — METRONIDAZOLE 500 MG PO TABS: 500.0000 mg | ORAL_TABLET | Freq: Two times a day (BID) | ORAL | 0 refills | Status: DC

## 2022-03-09 NOTE — Addendum Note (Signed)
Addended by: Aletha Halim on: 03/09/2022 01:57 PM   Modules accepted: Orders

## 2022-03-17 ENCOUNTER — Other Ambulatory Visit: Payer: Self-pay | Admitting: *Deleted

## 2022-03-17 ENCOUNTER — Ambulatory Visit: Payer: Medicaid Other | Admitting: *Deleted

## 2022-03-17 ENCOUNTER — Other Ambulatory Visit: Payer: Self-pay | Admitting: Obstetrics and Gynecology

## 2022-03-17 ENCOUNTER — Ambulatory Visit: Payer: Medicaid Other | Attending: Obstetrics and Gynecology

## 2022-03-17 ENCOUNTER — Other Ambulatory Visit: Payer: Self-pay | Admitting: Advanced Practice Midwife

## 2022-03-17 VITALS — BP 122/50 | HR 95

## 2022-03-17 DIAGNOSIS — O99212 Obesity complicating pregnancy, second trimester: Secondary | ICD-10-CM | POA: Insufficient documentation

## 2022-03-17 DIAGNOSIS — O365920 Maternal care for other known or suspected poor fetal growth, second trimester, not applicable or unspecified: Secondary | ICD-10-CM

## 2022-03-17 DIAGNOSIS — O0990 Supervision of high risk pregnancy, unspecified, unspecified trimester: Secondary | ICD-10-CM | POA: Diagnosis not present

## 2022-03-17 DIAGNOSIS — Z362 Encounter for other antenatal screening follow-up: Secondary | ICD-10-CM | POA: Diagnosis not present

## 2022-03-17 DIAGNOSIS — F1210 Cannabis abuse, uncomplicated: Secondary | ICD-10-CM

## 2022-03-17 DIAGNOSIS — Z720 Tobacco use: Secondary | ICD-10-CM

## 2022-03-17 DIAGNOSIS — E669 Obesity, unspecified: Secondary | ICD-10-CM

## 2022-03-17 DIAGNOSIS — Z3A27 27 weeks gestation of pregnancy: Secondary | ICD-10-CM | POA: Diagnosis not present

## 2022-03-17 DIAGNOSIS — O99332 Smoking (tobacco) complicating pregnancy, second trimester: Secondary | ICD-10-CM

## 2022-03-17 DIAGNOSIS — F319 Bipolar disorder, unspecified: Secondary | ICD-10-CM

## 2022-03-17 DIAGNOSIS — O99322 Drug use complicating pregnancy, second trimester: Secondary | ICD-10-CM | POA: Diagnosis not present

## 2022-03-17 DIAGNOSIS — F1290 Cannabis use, unspecified, uncomplicated: Secondary | ICD-10-CM

## 2022-03-18 MED ORDER — HYDROXYZINE PAMOATE 25 MG PO CAPS: 25.0000 mg | ORAL_CAPSULE | Freq: Three times a day (TID) | ORAL | 0 refills | Status: DC | PRN

## 2022-03-23 ENCOUNTER — Ambulatory Visit (HOSPITAL_BASED_OUTPATIENT_CLINIC_OR_DEPARTMENT_OTHER): Payer: Medicaid Other | Admitting: *Deleted

## 2022-03-23 ENCOUNTER — Ambulatory Visit: Payer: Medicaid Other | Admitting: *Deleted

## 2022-03-23 ENCOUNTER — Ambulatory Visit: Payer: Medicaid Other | Attending: Obstetrics and Gynecology

## 2022-03-23 VITALS — BP 119/63 | HR 81

## 2022-03-23 DIAGNOSIS — O365930 Maternal care for other known or suspected poor fetal growth, third trimester, not applicable or unspecified: Secondary | ICD-10-CM | POA: Insufficient documentation

## 2022-03-23 DIAGNOSIS — O99320 Drug use complicating pregnancy, unspecified trimester: Secondary | ICD-10-CM | POA: Diagnosis not present

## 2022-03-23 DIAGNOSIS — E669 Obesity, unspecified: Secondary | ICD-10-CM

## 2022-03-23 DIAGNOSIS — O0990 Supervision of high risk pregnancy, unspecified, unspecified trimester: Secondary | ICD-10-CM

## 2022-03-23 DIAGNOSIS — O99213 Obesity complicating pregnancy, third trimester: Secondary | ICD-10-CM | POA: Diagnosis not present

## 2022-03-23 DIAGNOSIS — O99333 Smoking (tobacco) complicating pregnancy, third trimester: Secondary | ICD-10-CM | POA: Diagnosis not present

## 2022-03-23 DIAGNOSIS — Z3A28 28 weeks gestation of pregnancy: Secondary | ICD-10-CM | POA: Insufficient documentation

## 2022-03-23 DIAGNOSIS — O365920 Maternal care for other known or suspected poor fetal growth, second trimester, not applicable or unspecified: Secondary | ICD-10-CM | POA: Diagnosis not present

## 2022-03-23 DIAGNOSIS — O99332 Smoking (tobacco) complicating pregnancy, second trimester: Secondary | ICD-10-CM | POA: Diagnosis not present

## 2022-03-23 DIAGNOSIS — F1290 Cannabis use, unspecified, uncomplicated: Secondary | ICD-10-CM | POA: Diagnosis not present

## 2022-03-23 DIAGNOSIS — O99323 Drug use complicating pregnancy, third trimester: Secondary | ICD-10-CM | POA: Diagnosis not present

## 2022-03-23 DIAGNOSIS — O99212 Obesity complicating pregnancy, second trimester: Secondary | ICD-10-CM | POA: Diagnosis not present

## 2022-03-23 NOTE — Procedures (Signed)
April James 1990/03/17 [redacted]w[redacted]d Fetus A Non-Stress Test Interpretation for 03/23/22  Indication: IUGR  Fetal Heart Rate A Mode: External (pt taken off the monitor per provider order.) Baseline Rate (A): 120 bpm Variability: Moderate Accelerations: 15 x 15, 10 x 10 Decelerations: Variable Multiple birth?: No  Uterine Activity Mode: Toco Contraction Frequency (min): none Resting Tone Palpated: Relaxed  Interpretation (Fetal Testing) Nonstress Test Interpretation: Reactive Overall Impression: Reassuring for gestational age Comments: Dr. FAnnamaria BootsReviewed Tracing.

## 2022-03-30 ENCOUNTER — Ambulatory Visit: Payer: Medicaid Other | Attending: Obstetrics

## 2022-03-30 ENCOUNTER — Ambulatory Visit: Payer: Medicaid Other | Admitting: *Deleted

## 2022-03-30 VITALS — BP 120/61 | HR 84

## 2022-03-30 DIAGNOSIS — O365930 Maternal care for other known or suspected poor fetal growth, third trimester, not applicable or unspecified: Secondary | ICD-10-CM | POA: Insufficient documentation

## 2022-03-30 DIAGNOSIS — E669 Obesity, unspecified: Secondary | ICD-10-CM

## 2022-03-30 DIAGNOSIS — O99332 Smoking (tobacco) complicating pregnancy, second trimester: Secondary | ICD-10-CM | POA: Insufficient documentation

## 2022-03-30 DIAGNOSIS — O99213 Obesity complicating pregnancy, third trimester: Secondary | ICD-10-CM

## 2022-03-30 DIAGNOSIS — O365920 Maternal care for other known or suspected poor fetal growth, second trimester, not applicable or unspecified: Secondary | ICD-10-CM | POA: Diagnosis not present

## 2022-03-30 DIAGNOSIS — O0990 Supervision of high risk pregnancy, unspecified, unspecified trimester: Secondary | ICD-10-CM | POA: Diagnosis not present

## 2022-03-30 DIAGNOSIS — O99212 Obesity complicating pregnancy, second trimester: Secondary | ICD-10-CM | POA: Insufficient documentation

## 2022-03-30 DIAGNOSIS — F1290 Cannabis use, unspecified, uncomplicated: Secondary | ICD-10-CM | POA: Insufficient documentation

## 2022-03-30 DIAGNOSIS — O99333 Smoking (tobacco) complicating pregnancy, third trimester: Secondary | ICD-10-CM | POA: Diagnosis not present

## 2022-03-30 DIAGNOSIS — O99323 Drug use complicating pregnancy, third trimester: Secondary | ICD-10-CM | POA: Diagnosis not present

## 2022-03-30 DIAGNOSIS — Z3A29 29 weeks gestation of pregnancy: Secondary | ICD-10-CM

## 2022-03-30 DIAGNOSIS — O99320 Drug use complicating pregnancy, unspecified trimester: Secondary | ICD-10-CM | POA: Insufficient documentation

## 2022-03-30 NOTE — Procedures (Addendum)
April James 01-Jun-1990 [redacted]w[redacted]d Fetus A Non-Stress Test Interpretation for 03/30/22  Indication: IUGR  Fetal Heart Rate A Mode: External Baseline Rate (A): 140 bpm Variability: Moderate Accelerations: 15 x 15 Decelerations: None Multiple birth?: No  Uterine Activity Mode: Toco Contraction Frequency (min): Irregular Contraction Duration (sec): 40-60 Contraction Quality: Mild Resting Tone Palpated: Relaxed Resting Time: Adequate  Interpretation (Fetal Testing) Nonstress Test Interpretation: Reactive Overall Impression: Reassuring for gestational age Comments: Tracing reviewed by Dr. SEpimenio Sarin

## 2022-04-05 ENCOUNTER — Encounter: Payer: Self-pay | Admitting: Obstetrics & Gynecology

## 2022-04-05 ENCOUNTER — Other Ambulatory Visit: Payer: Self-pay | Admitting: Advanced Practice Midwife

## 2022-04-05 ENCOUNTER — Ambulatory Visit (INDEPENDENT_AMBULATORY_CARE_PROVIDER_SITE_OTHER): Payer: Medicaid Other | Admitting: Obstetrics & Gynecology

## 2022-04-05 VITALS — BP 122/79 | HR 77 | Wt 205.0 lb

## 2022-04-05 DIAGNOSIS — F319 Bipolar disorder, unspecified: Secondary | ICD-10-CM

## 2022-04-05 DIAGNOSIS — Z3A30 30 weeks gestation of pregnancy: Secondary | ICD-10-CM

## 2022-04-05 DIAGNOSIS — Z23 Encounter for immunization: Secondary | ICD-10-CM | POA: Diagnosis not present

## 2022-04-05 DIAGNOSIS — O0990 Supervision of high risk pregnancy, unspecified, unspecified trimester: Secondary | ICD-10-CM

## 2022-04-05 DIAGNOSIS — O365930 Maternal care for other known or suspected poor fetal growth, third trimester, not applicable or unspecified: Secondary | ICD-10-CM | POA: Insufficient documentation

## 2022-04-05 DIAGNOSIS — O99343 Other mental disorders complicating pregnancy, third trimester: Secondary | ICD-10-CM

## 2022-04-05 NOTE — Progress Notes (Signed)
PRENATAL VISIT NOTE  Subjective:  April James is a 32 y.o. [id number] at 76w1dbeing seen today for ongoing prenatal care.  She is currently monitored for the following issues for this high-risk pregnancy and has Bipolar disorder with current episode depressed ([place]; Alcohol use disorder, mild, abuse; Bipolar affective disorder, currently depressed, moderate ([place]; Supervision of high risk pregnancy, antepartum; Tobacco use affecting pregnancy, antepartum; Chlamydia infection affecting pregnancy in second trimester; Obesity in pregnancy; Bipolar disease in pregnancy in third trimester ([place]; BMI 36.0-36.9,adult; and IUGR (intrauterine growth restriction) affecting care of mother, third trimester on their problem list.  Patient reports  some spots on arms and neck .  Contractions: Not present. Vag. Bleeding: None.  Movement: Present. Denies leaking of fluid.   The following portions of the patient's history were reviewed and updated as appropriate: allergies, current medications, past family history, past medical history, past social history, past surgical history and problem list.   Objective:   Vitals:   04/05/22 1541  BP: 122/79  Pulse: 77  Weight: 205 lb (93 kg)    Fetal Status: Fetal Heart Rate (bpm): 142   Movement: Present     General:  Alert, oriented and cooperative. Patient is in no acute distress.  Skin: Skin is warm and dry. Isolated erythematous papules in arms, legs, neck.   Cardiovascular: Normal heart rate noted  Respiratory: Normal respiratory effort, no problems with respiration noted  Abdomen: Soft, gravid, appropriate for gestational age.  Pain/Pressure: Absent     Pelvic: Cervical exam deferred        Extremities: Normal range of motion.  Edema: None  Mental Status: Normal mood and affect. Normal behavior. Normal judgment and thought content.   Imaging: [place]FM UA CORD DOPPLER  Result Date:  03/30/2022 ----------------------------------------------------------------------  OBSTETRICS REPORT                       (Signed Final 03/30/2022 02:59 pm) ---------------------------------------------------------------------- Patient Info  ID #:       [id number]                         D.O.B.:  [birth year](32 yrs)  Name:       April James                   Visit Date: 03/30/2022 01:46 pm ---------------------------------------------------------------------- Performed By  Attending:        BValeda MalmDO       Ref. Address:     [address] Performed By:     JRolm BookbinderRDMS     [facility]  Referred By:      [facility] ---------------------------------------------------------------------- Orders  #  Description                           Code        Ordered By  1  [place] MFM UA CORD DOPPLER                [phone]    Peterson Ao  2  [place] MFM OB LIMITED                     [id number]    YU FANG ----------------------------------------------------------------------  #  Order #                     Accession #                Episode #  1  [id number]                   [id number]                 [id number]  2  [id number]                   [id number]                 [id number] ---------------------------------------------------------------------- Indications  Maternal care for known or suspected poor      O36.5930  fetal growth, third trimester, not applicable or  unspecified IUGR  [redacted] weeks gestation of pregnancy                Q000111Q  Obesity complicating pregnancy, third          O99.213  trimester (BMI 32)  Tobacco use complicating pregnancy, third      O99.333  trimester  Substance abuse affecting pregnancy,            O99.320 F19.10  antepartum (Marijuana; from prenatal  records)  LR NIPS/neg AFP/neg Horizon ---------------------------------------------------------------------- Fetal Evaluation  Num Of Fetuses:         1  Fetal Heart Rate(bpm):  137  Cardiac Activity:       Observed  Presentation:           Cephalic  Placenta:               Posterior  P. Cord Insertion:      Previously visualized  Amniotic Fluid  AFI FV:      Within normal limits  AFI Sum(cm)     %Tile       Largest Pocket(cm)  17.78           67          5.85  RUQ(cm)       RLQ(cm)       LUQ(cm)        LLQ(cm)  5.85          5.1           2.61           4.22 ----------------------------------------------------------------------  OB History  Gravidity:    4         Term:   1        Prem:   0        SAB:   1  TOP:          1       Ectopic:  0        Living: 1 ---------------------------------------------------------------------- Gestational Age  LMP:           29w 2d        Date:  09/06/21                   EDD:   06/13/22  Best:          29w 2d     Det. By:  LMP  (09/06/21)          EDD:   06/13/22 ---------------------------------------------------------------------- Anatomy  Cranium:               Appears normal         Kidneys:                Appear normal  Stomach:               Appears normal, left   Bladder:                Appears normal                         sided  Abdomen:               Appears normal ---------------------------------------------------------------------- Doppler - Fetal Vessels  Umbilical Artery   S/D     %tile      RI    %tile      PI    %tile     PSV    ADFV    RDFV                                                     (cm/s)   3.99       92    0.75       91    1.26       92    48.84      No      No ---------------------------------------------------------------------- Comments  The patient is here for a follow-up ultrasound for FGR at 29w  2d. EDD: 06/13/2022. Dating: LMP  (09/06/21). She has no  concerns today.  I discussed tobacco and  marijuana cessation during  pregnancy.  She is going to try to cut back.  She has  NicoDerm patches as well.  I instructed her if she is going to  use any nicotine replacement therapy she should discontinue  all tobacco products.  She verbalized understanding and will  try to cut back.  Sonographic findings  Single intrauterine pregnancy.  Fetal cardiac activity:  Observed and appears normal.  Presentation: Cephalic.  Interval fetal anatomy appears normal  Amniotic fluid volume: Within normal limits. AFI: 17.78 cm.  MVP: 5.85 cm.  Placenta: Posterior.  Umbilical artery dopplers findings:  -S/D:3.99 which are normal at this gestational age.  -Absent end-diastolic flow: No.  -Reversed end-diastolic flow:  No.  NST was reassuring for gestational age.  Recommendations  - Continue weekly UA dopplers and antenatal testing.  - Serial growth [place] every 3 weeks until delivery.  - Delivery timing pending clinical course. ----------------------------------------------------------------------                  Valeda Malm, DO Electronically Signed Final Report   03/30/2022 02:59 pm ----------------------------------------------------------------------  [place] MFM OB LIMITED  Result Date: 03/30/2022 ----------------------------------------------------------------------  OBSTETRICS REPORT                       (Signed Final 03/30/2022 02:59 pm) ---------------------------------------------------------------------- Patient Info  ID #:       [id number]                          D.O.B.:  [birth year] (32 yrs)  Name:       April James                   Visit Date: 03/30/2022 01:46 pm ---------------------------------------------------------------------- Performed By  Attending:        Valeda Malm DO       Ref. Address:     [place]  Performed By:     Rolm Bookbinder RDMS     [facility]         [facility]                                                             [place] [facility]                                                             Women  Referred By:      [facility] ---------------------------------------------------------------------- Orders  #  Description                           Code        Ordered By  1  [place] MFM UA CORD DOPPLER                76820.02    YU FANG  2  [place] MFM OB LIMITED                     [id number]    YU FANG ----------------------------------------------------------------------  #  Order #  Accession #                Episode #  1  [id number]                   [id number]                 [id number]  2  [id number]                   [id number]                 [id number] ---------------------------------------------------------------------- Indications  Maternal care for known or suspected poor      O36.5930  fetal growth, third trimester, not applicable or  unspecified IUGR  [redacted] weeks gestation of pregnancy                Q000111Q  Obesity complicating pregnancy, third          O99.213  trimester (BMI 32)  Tobacco use complicating pregnancy, third      O99.333  trimester  Substance abuse affecting pregnancy,           O99.320 F19.10  antepartum (Marijuana; from prenatal  records)  LR NIPS/neg AFP/neg Horizon ---------------------------------------------------------------------- Fetal Evaluation  Num Of Fetuses:         1  Fetal Heart Rate(bpm):  137  Cardiac Activity:       Observed  Presentation:           Cephalic  Placenta:               Posterior  P. Cord Insertion:      Previously visualized  Amniotic Fluid  AFI FV:      Within normal limits  AFI Sum(cm)     %Tile       Largest Pocket(cm)  17.78           67          5.85  RUQ(cm)       RLQ(cm)       LUQ(cm)        LLQ(cm)  5.85          5.1           2.61           4.22 ---------------------------------------------------------------------- OB History  Gravidity:    4         Term:   1        Prem:   0         SAB:   1  TOP:          1       Ectopic:  0        Living: 1 ---------------------------------------------------------------------- Gestational Age  LMP:           29w 2d        Date:  09/06/21                   EDD:   06/13/22  Best:          29w 2d     Det. By:  LMP  (09/06/21)          EDD:   06/13/22 ---------------------------------------------------------------------- Anatomy  Cranium:               Appears normal         Kidneys:                Appear normal  Stomach:               Appears normal, left   Bladder:                Appears normal                         sided  Abdomen:               Appears normal ---------------------------------------------------------------------- Doppler - Fetal Vessels  Umbilical Artery   S/D     %tile      RI    %tile      PI    %tile     PSV    ADFV    RDFV                                                     (cm/s)   3.99       92    0.75       91    1.26       92    48.84      No      No ---------------------------------------------------------------------- Comments  The patient is here for a follow-up ultrasound for FGR at 29w  2d. EDD: 06/13/2022. Dating: LMP  (09/06/21). She has no  concerns today.  I discussed tobacco and marijuana cessation during  pregnancy.  She is going to try to cut back.  She has  NicoDerm patches as well.  I instructed her if she is going to  use any nicotine replacement therapy she should discontinue  all tobacco products.  She verbalized understanding and will  try to cut back.  Sonographic findings  Single intrauterine pregnancy.  Fetal cardiac activity:  Observed and appears normal.  Presentation: Cephalic.  Interval fetal anatomy appears normal  Amniotic fluid volume: Within normal limits. AFI: 17.78 cm.  MVP: 5.85 cm.  Placenta: Posterior.  Umbilical artery dopplers findings:  -S/D:3.99 which are normal at this gestational age.  -Absent end-diastolic flow: No.  -Reversed end-diastolic flow:  No.  NST was reassuring for gestational age.   Recommendations  - Continue weekly UA dopplers and antenatal testing.  - Serial growth [place] every 3 weeks until delivery.  - Delivery timing pending clinical course. ----------------------------------------------------------------------                  Valeda Malm, DO Electronically Signed Final Report   03/30/2022 02:59 pm ----------------------------------------------------------------------  [place] MFM UA CORD DOPPLER  Result Date: 03/23/2022 ----------------------------------------------------------------------  OBSTETRICS REPORT                       (Signed Final 03/23/2022 10:57 am) ---------------------------------------------------------------------- Patient Info  ID #:       [id number]                          D.O.B.:  13-Jun-1990 (32 yrs)  Name:       April James                   Visit Date: 03/23/2022 09:30 am ---------------------------------------------------------------------- Performed By  Attending:        Johnell Comings MD         Ref. Address:  [place]  Performed By:     Nathen May       Secondary Phy.:   [place]  Referred By:      [facility]       Location:         [facility]                                                             [facility] at                                                             [facility] for                                                             Women ---------------------------------------------------------------------- Orders  #  Description                           Code        Ordered By  1  [place] MFM UA CORD DOPPLER                [phone]    YU FANG  2  [place] MFM OB LIMITED                     [id number]    YU FANG ----------------------------------------------------------------------  #  Order #                     Accession #                Episode #  1  [id number]                    [id number]                 [id number]  2  [id number]                   [id number]  [id number] ---------------------------------------------------------------------- Indications  Maternal care for known or suspected poor      O36.5930  fetal growth, third trimester, not applicable or  unspecified IUGR  Obesity complicating pregnancy, third          O99.213  trimester (BMI 32)  Tobacco use complicating pregnancy, third      O99.333  trimester  Substance abuse affecting pregnancy,           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 Cord Insertion:      Previously visualized  Amniotic Fluid  AFI FV:      Within normal limits  AFI Sum(cm)     %Tile       Largest Pocket(cm)  18.67           72          7.19  RUQ(cm)       RLQ(cm)       LUQ(cm)        LLQ(cm)  7.19          2.34          3.31           5.83 ---------------------------------------------------------------------- OB History  Gravidity:    4         Term:   1        Prem:   0        SAB:   1  TOP:          1       Ectopic:  0        Living: 1 ---------------------------------------------------------------------- Gestational Age  LMP:           28w 2d        Date:  09/06/21                   EDD:   06/13/22  Best:          Timothy Lasso 2d     Det. By:  LMP  (09/06/21)          EDD:   06/13/22 ---------------------------------------------------------------------- Anatomy  Diaphragm:             Appears normal         Kidneys:                Appear normal   Stomach:               Appears normal, left   Bladder:                Appears normal  sided ---------------------------------------------------------------------- Doppler - Fetal Vessels  Umbilical Artery   S/D     %tile      RI    %tile      PI    %tile     PSV    ADFV    RDFV                                                     (cm/s)   3.13       57    0.68       61    0.96       42    53.36      No      No ---------------------------------------------------------------------- Comments  This patient was seen due to an IUGR fetus.  She denies any  problems since her last exam.  She reports feeling vigorous  fetal movements throughout the day.  She had a reactive NST for her gestational age today.  The total AFI was 18.67 cm (within normal limits).  Doppler studies of the umbilical arteries performed due to  fetal growth restriction showed a normal S/D ratio of 3.13.  There were no signs of absent or reversed end-diastolic flow  noted today.  She will return in 1 week for another NST and umbilical artery  Doppler study. ----------------------------------------------------------------------                   Johnell Comings, MD Electronically Signed Final Report   03/23/2022 10:57 am ----------------------------------------------------------------------  [place] MFM OB LIMITED  Result Date: 03/23/2022 ----------------------------------------------------------------------  OBSTETRICS REPORT                       (Signed Final 03/23/2022 10:57 am) ---------------------------------------------------------------------- Patient Info  ID #:       [id number]                          D.O.B.:  January 10, 1991 (32 yrs)  Name:       April James                   Visit Date: 03/23/2022 09:30 am ---------------------------------------------------------------------- Performed By  Attending:        Johnell Comings MD         Ref. Address:     [place]   Performed By:     Nathen May       Secondary Phy.:   Crissie Figures                    RDMS                                                             Jeronimo Greaves  Referred By:      [place]       Location[facility]         [facility] at                                                             [facility] for                                                             Women ---------------------------------------------------------------------- Orders  #  Description                           Code        Ordered By  1  [place] MFM UA CORD DOPPLER                [phone]    Peterson Ao  2  [place] MFM OB LIMITED                     [id number]    YU FANG ----------------------------------------------------------------------  #  Order #                     Accession #                Episode #  1  [id number]                   [id number]                 [id number]  2  [id number]                   [place]:[id number]                 FE:[id number] ---------------------------------------------------------------------- Indications  Maternal care for known or suspected poor      O36.5930  fetal growth, third trimester, not applicable or  unspecified IUGR  Obesity complicating pregnancy, third          O99.213  trimester (BMI 32)  Tobacco use complicating pregnancy, third      O99.333  trimester  Substance abuse affecting pregnancy,           O99.320 F19.10  antepartum (Marijuana; from prenatal  records)  [redacted] weeks gestation of pregnancy                Z3A.28  LR NIPS/neg AFP/neg Horizon ---------------------------------------------------------------------- Vital Signs  Height:        5'2" ---------------------------------------------------------------------- Fetal Evaluation  Num Of Fetuses:         1  Fetal Heart Rate(bpm):  143  Cardiac Activity:       Observed  Presentation:           Cephalic  Placenta:               Posterior  P.  Cord Insertion:      Previously visualized  Amniotic Fluid  AFI FV:      Within normal limits  AFI Sum(cm)     %Tile       Largest Pocket(cm)  18.67           72          7.19  RUQ(cm)       RLQ(cm)       LUQ(cm)        LLQ(cm)  7.19          2.34          3.31           5.83 ---------------------------------------------------------------------- OB History  Gravidity:    4         Term:   1        Prem:   0        SAB:   1  TOP:          1       Ectopic:  0        Living: 1 ---------------------------------------------------------------------- Gestational Age  LMP:           28w 2d        Date:  09/06/21                   EDD:   06/13/22  Best:          Timothy Lasso 2d     Det. By:  LMP  (09/06/21)          EDD:   06/13/22 ---------------------------------------------------------------------- Anatomy  Diaphragm:             Appears normal         Kidneys:                Appear normal  Stomach:               Appears normal, left   Bladder:                Appears normal                         sided ---------------------------------------------------------------------- Doppler - Fetal Vessels  Umbilical Artery   S/D     %tile      RI    %tile      PI    %tile     PSV    ADFV    RDFV                                                     (cm/s)   3.13       57    0.68       61    0.96       42    53.36  No      No ---------------------------------------------------------------------- Comments  This patient was seen due to an IUGR fetus.  She denies any  problems since her last exam.  She reports feeling vigorous  fetal movements throughout the day.  She had a reactive NST for her gestational age today.  The total AFI was 18.67 cm (within normal limits).  Doppler studies of the umbilical arteries performed due to  fetal growth restriction showed a normal S/D ratio of 3.13.  There were no signs of absent or reversed end-diastolic flow  noted today.  She will return in 1 week for another NST and umbilical artery  Doppler study.  ----------------------------------------------------------------------                   Johnell Comings, MD Electronically Signed Final Report   03/23/2022 10:57 am ----------------------------------------------------------------------  [place] MFM OB FOLLOW UP  Result Date: 03/17/2022 ----------------------------------------------------------------------  OBSTETRICS REPORT                       (Signed Final 03/17/2022 03:39 pm) ---------------------------------------------------------------------- Patient Info  ID #:       [id number]                          D.O.B.:  [birth year] (32 yrs)  Name:       April James                   Visit Date: 03/17/2022 12:42 pm ---------------------------------------------------------------------- Performed By  Attending:        Johnell Comings MD         Ref. Address:     [place]  Performed By:     Rosana Hoes          Secondary Phy.:   Darlina Rumpf  Referred By:      [place]       Location[facility]         [facility]                                                             [facility] Care at                                                             [facility] for  Women ---------------------------------------------------------------------- Orders  #  Description                           Code        Ordered By  1  [place] MFM OB FOLLOW UP                   [phone]    [place]  2  [place] MFM UA CORD DOPPLER                [id number]    [facility] ----------------------------------------------------------------------  #  Order #                     Accession #                Episode #  1  [id number]                   [id number]                 [id number]  2  [id number]                   [id number]                 [id number] ----------------------------------------------------------------------  Indications  Obesity complicating pregnancy, second         O99.212  trimester(BMI 32)  Tobacco use complicating pregnancy,            O99.332  second trimester  Encounter for antenatal screening for          Z36.3  malformations  LR NIPS/neg AFP/neg Horizon  Substance abuse affecting pregnancy,           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 Cord Insertion:      Previously visualized  Amniotic Fluid  AFI FV:      Within normal limits                              Largest Pocket(cm)                              5.99 ---------------------------------------------------------------------- Biometry  BPD:      66.1  mm     G. Age:  26w 5d         16  %    CI:        73.12   %    70 - 86  FL/HC:      18.4   %    18.6 - 20.4  HC:      245.7  mm     G. Age:  26w 5d          7  %    HC/AC:      1.08        1.05 - 1.21  AC:      227.8  mm     G. Age:  27w 1d         33  %    FL/BPD:     68.4   %    71 - 87  FL:       45.2  mm     G. Age:  25w 0d        < 1  %    FL/AC:      19.8   %    20 - 24  HUM:      42.7  mm     G. Age:  25w 4d        < 5  %  ULN:        34  mm     G. Age:  23w 1d        < 5  %  TIB:      40.4  mm     G. Age:  25w 3d        < 5  %  RAD:      38.8  mm     G. Age:  26w 6d         46  %  FIB:      38.2  mm     G. Age:  24w 3d         24  %  Est. FW:     920  gm           2 lb      8  % ---------------------------------------------------------------------- OB History  Gravidity:     4         Term:   1        Prem:   0        SAB:   1  TOP:          1       Ectopic:  0        Living: 1 ---------------------------------------------------------------------- Gestational Age  LMP:           27w 3d        Date:  09/06/21                   EDD:   06/13/22  U/S Today:     26w 3d                                        EDD:   06/20/22  Best:          27w 3d     Det. By:  LMP  (09/06/21)          EDD:   06/13/22 ---------------------------------------------------------------------- Anatomy  Cranium:               Appears normal         LVOT:  Previously seen  Cavum:                 Previously             Aortic Arch:            Previously seen                         visualized  Ventricles:            Previously seen        Ductal Arch:            Appears normal  Choroid Plexus:        Previously seen        Diaphragm:              Appears normal  Cerebellum:            Previously seen        Stomach:                Appears normal, left                                                                        sided  Posterior Fossa:       Previously seen        Abdomen:                Previously seen  Nuchal Fold:           Not applicable ([id number]    Abdominal Wall:         Previously seen                         wks GA)  Face:                  Orbits and profile     Cord Vessels:           Previously seen                         previously seen  Lips:                  Previously seen        Kidneys:                Appear normal  Palate:                Appears normal         Bladder:                Appears normal  Thoracic:              Previously seen        Spine:                  Previously seen  Heart:                 Previously seen        Upper Extremities:      Previously seen  RVOT:  Previously seen        Lower Extremities:      Previously seen  Other:  Fetus appears to be a female. VC, Falx, Right Heel and Left foot prev          visualized.  Left heel, right foot, Palate,  3VV, 3VTV, hands/5th          visualized. Technically difficult due to fetal position. ---------------------------------------------------------------------- Doppler - Fetal Vessels  Umbilical Artery   S/D     %tile      RI    %tile      PI    %tile     PSV    ADFV    RDFV                                                     (cm/s)   3.58       75    0.72       76    1.22       83    38.26      No      No ---------------------------------------------------------------------- Cervix Uterus Adnexa  Adnexa  No abnormality visualized ---------------------------------------------------------------------- Comments  This patient was seen for a follow-up exam due to maternal  obesity.  She denies any problems since her last exam and  reports feeling vigorous fetal movements throughout the day.  On today's exam, the EFW of 2 pounds measures at the 8th  percentile for her gestational age indicating IUGR.  The low  EFW is mainly due to the short long bones.  There was normal amniotic fluid noted on today's exam.  Fetal movements were noted throughout today's exam.  Doppler studies of the umbilical arteries showed a normal  S/D ratio of 3.58 .  There were no signs of absent or reversed  end-diastolic flow.  The association of shortened long bones with fetal skeletal  dysplasia was discussed.  She was advised regarding the  availability of an amniocentesis for definitive diagnosis.  She  declined the amniocentesis today.  They were reassured that  my suspicion for fetal skeletal dysplasia is low.  Due to fetal growth restriction, we will continue to follow her  with weekly fetal testing and umbilical artery Doppler studies.  She will return in 1 week for a BPP and umbilical artery  Doppler study. ----------------------------------------------------------------------                   Johnell Comings, MD Electronically Signed Final Report   03/17/2022 03:39 pm ----------------------------------------------------------------------  [place]  MFM UA CORD DOPPLER  Result Date: 03/17/2022 ----------------------------------------------------------------------  OBSTETRICS REPORT                       (Signed Final 03/17/2022 03:39 pm) ---------------------------------------------------------------------- Patient Info  ID #:       [id number]                          D.O.B.:  [birth year] (32 yrs)  Name:       April James                   Visit Date: 03/17/2022 12:42 pm ---------------------------------------------------------------------- Performed By  Attending:        Johnell Comings MD  Ref. Address:     [place]  Performed By:     Rosana Hoes          Secondary Phy.:   Darlina Rumpf  Referred By:      [facility]       [facility]                                                             [place] [facility] ---------------------------------------------------------------------- Orders  #  Description                           Code        Ordered By  1  [place] MFM OB FOLLOW UP                   [id number]    RAVI [facility]  2  [place] MFM UA CORD DOPPLER                [id number]    RAVI [facility] ----------------------------------------------------------------------  #  Order #                     Accession #                Episode #  1  [id number]                   [id number]                 [id number]  2  [id number]                   [id number]                 [id number] ---------------------------------------------------------------------- Indications  Obesity complicating pregnancy, second         O99.212  trimester(BMI 32)  Tobacco use complicating pregnancy,            O99.332  second trimester  Encounter for antenatal screening for          Z36.3  malformations  LR  NIPS/neg AFP/neg Horizon  Substance abuse affecting pregnancy,           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 Cord Insertion:      Previously visualized  Amniotic Fluid  AFI FV:      Within normal limits                              Largest Pocket(cm)                              5.99 ---------------------------------------------------------------------- Biometry  BPD:      66.1  mm     G. Age:  26w 5d         16  %    CI:        73.12   %    70 - 86                                                          FL/HC:      18.4   %    18.6 - 20.4  HC:      245.7  mm     G. Age:  26w 5d          7  %    HC/AC:      1.08        1.05 - 1.21  AC:      227.8  mm     G. Age:  27w 1d         33  %    FL/BPD:     68.4   %    71 - 87  FL:       45.2  mm     G. Age:  25w 0d        < 1  %    FL/AC:      19.8   %    20 - 24  HUM:      42.7  mm  G. Age:  25w 4d        < 5  %  ULN:        34  mm     G. Age:  23w 1d        < 5  %  TIB:      40.4  mm     G. Age:  25w 3d        < 5  %  RAD:      38.8  mm     G. Age:  26w 6d         46  %  FIB:      38.2  mm     G. Age:  24w 3d         24  %  Est. FW:     920  gm           2 lb      8  % ---------------------------------------------------------------------- OB History  Gravidity:    4         Term:   1        Prem:   0        SAB:   1  TOP:          1       Ectopic:  0        Living: 1 ---------------------------------------------------------------------- Gestational Age  LMP:           27w 3d        Date:   09/06/21                   EDD:   06/13/22  U/S Today:     26w 3d                                        EDD:   06/20/22  Best:          27w 3d     Det. By:  LMP  (09/06/21)          EDD:   06/13/22 ---------------------------------------------------------------------- Anatomy  Cranium:               Appears normal         LVOT:                   Previously seen  Cavum:                 Previously             Aortic Arch:            Previously seen                         visualized  Ventricles:            Previously seen        Ductal Arch:            Appears normal  Choroid Plexus:        Previously seen        Diaphragm:              Appears normal  Cerebellum:            Previously seen        Stomach:  Appears normal, left                                                                        sided  Posterior Fossa:       Previously seen        Abdomen:                Previously seen  Nuchal Fold:           Not applicable (Q000111Q    Abdominal Wall:         Previously seen                         wks GA)  Face:                  Orbits and profile     Cord Vessels:           Previously seen                         previously seen  Lips:                  Previously seen        Kidneys:                Appear normal  Palate:                Appears normal         Bladder:                Appears normal  Thoracic:              Previously seen        Spine:                  Previously seen  Heart:                 Previously seen        Upper Extremities:      Previously seen  RVOT:                  Previously seen        Lower Extremities:      Previously seen  Other:  Fetus appears to be a female. VC, Falx, Right Heel and Left foot prev          visualized.  Left heel, right foot, Palate, 3VV, 3VTV, hands/5th          visualized. Technically difficult due to fetal position. ---------------------------------------------------------------------- Doppler - Fetal Vessels  Umbilical Artery   S/D     %tile      RI     %tile      PI    %tile     PSV    ADFV    RDFV                                                     (cm/s)   3.58  75    0.72       76    1.22       83    38.26      No      No ---------------------------------------------------------------------- Cervix Uterus Adnexa  Adnexa  No abnormality visualized ---------------------------------------------------------------------- Comments  This patient was seen for a follow-up exam due to maternal  obesity.  She denies any problems since her last exam and  reports feeling vigorous fetal movements throughout the day.  On today's exam, the EFW of 2 pounds measures at the 8th  percentile for her gestational age indicating IUGR.  The low  EFW is mainly due to the short long bones.  There was normal amniotic fluid noted on today's exam.  Fetal movements were noted throughout today's exam.  Doppler studies of the umbilical arteries showed a normal  S/D ratio of 3.58 .  There were no signs of absent or reversed  end-diastolic flow.  The association of shortened long bones with fetal skeletal  dysplasia was discussed.  She was advised regarding the  availability of an amniocentesis for definitive diagnosis.  She  declined the amniocentesis today.  They were reassured that  my suspicion for fetal skeletal dysplasia is low.  Due to fetal growth restriction, we will continue to follow her  with weekly fetal testing and umbilical artery Doppler studies.  She will return in 1 week for a BPP and umbilical artery  Doppler study. ----------------------------------------------------------------------                   Johnell Comings, MD Electronically Signed Final Report   03/17/2022 03:39 pm ----------------------------------------------------------------------   Assessment and Plan:  Pregnancy: [id number] at 70w1d1. IUGR (intrauterine growth restriction) affecting care of mother, third trimester Continue scans and antenatal testing as per MFM.   2. Bipolar disease in pregnancy in  third trimester ([facility] Given information about LRenard Hamper she has no concerns but wants to talk to someone.  3. Need for Tdap vaccination - Tdap vaccine greater than or equal to 7yo IM given today.  4. [redacted] weeks gestation of pregnancy 5. Supervision of high risk pregnancy, antepartum Rash concerning for possible bites?  Recommended hydrocortisone cream for now, continue to monitor.  Preterm labor symptoms and general obstetric precautions including but not limited to vaginal bleeding, contractions, leaking of fluid and fetal movement were reviewed in detail with the patient. Please refer to After Visit Summary for other counseling recommendations.   Return in about 2 weeks (around 04/19/2022) for OFFICE OB VISIT (MD or APP).  Future Appointments  Date Time Provider DSunset 04/06/2022  2:45 PM [facility]/06[facility]20[facility]  3:00 PM [facility]/06[facility]20[facility]  4:00 PM [facility] 04/13/2022  2:00 PM [facility]/13/20[facility]  2:15 PM [facility] 04/13/2022  3:00 PM [facility] 04/19/2022  4:10 PM Correna Meacham, USallyanne Havers MD [facility]  05/03/2022  3:30 PM Lestine Rahe, USallyanne Havers MD [facility]  05/17/2022  4:10 PM Yaffa Seckman, USallyanne Havers MD [facility]  05/24/2022  3:30 PM Constant, PVickii Chafe MD [facility]  05/31/2022  3:50 PM Briunna Leicht, USallyanne Havers MD [facility]    UVerita Schneiders MD

## 2022-04-05 NOTE — Patient Instructions (Addendum)
Return to office for any scheduled appointments. Call the office or go to the MAU at [place] at [facility] if: You begin to have strong, frequent contractions Your water breaks.  Sometimes it is a big gush of fluid, sometimes it is just a trickle that keeps getting your underwear wet or running down your legs You have vaginal bleeding.  It is normal to have a small amount of spotting if your cervix was checked.  You do not feel your baby moving like normal.  If you do not, get something to eat and drink and lay down and focus on feeling your baby move.   If your baby is still not moving like normal, you should call the office or go to MAU. Any other obstetric concerns.  April James is a virtual mental health platform available to our patients   We can refer you to a local mental health provider or you can refer yourself to this online platform using the link below  [email or web address]

## 2022-04-06 ENCOUNTER — Ambulatory Visit: Payer: Medicaid Other | Attending: Obstetrics

## 2022-04-06 ENCOUNTER — Ambulatory Visit: Payer: Medicaid Other | Admitting: *Deleted

## 2022-04-06 ENCOUNTER — Ambulatory Visit (HOSPITAL_BASED_OUTPATIENT_CLINIC_OR_DEPARTMENT_OTHER): Payer: Medicaid Other | Admitting: *Deleted

## 2022-04-06 VITALS — BP 124/68 | HR 80

## 2022-04-06 DIAGNOSIS — O365930 Maternal care for other known or suspected poor fetal growth, third trimester, not applicable or unspecified: Secondary | ICD-10-CM

## 2022-04-06 DIAGNOSIS — O99320 Drug use complicating pregnancy, unspecified trimester: Secondary | ICD-10-CM | POA: Insufficient documentation

## 2022-04-06 DIAGNOSIS — O99323 Drug use complicating pregnancy, third trimester: Secondary | ICD-10-CM | POA: Diagnosis not present

## 2022-04-06 DIAGNOSIS — O0990 Supervision of high risk pregnancy, unspecified, unspecified trimester: Secondary | ICD-10-CM

## 2022-04-06 DIAGNOSIS — Z3A30 30 weeks gestation of pregnancy: Secondary | ICD-10-CM

## 2022-04-06 DIAGNOSIS — E669 Obesity, unspecified: Secondary | ICD-10-CM | POA: Diagnosis not present

## 2022-04-06 DIAGNOSIS — O99332 Smoking (tobacco) complicating pregnancy, second trimester: Secondary | ICD-10-CM | POA: Insufficient documentation

## 2022-04-06 DIAGNOSIS — O99212 Obesity complicating pregnancy, second trimester: Secondary | ICD-10-CM | POA: Diagnosis not present

## 2022-04-06 DIAGNOSIS — O365920 Maternal care for other known or suspected poor fetal growth, second trimester, not applicable or unspecified: Secondary | ICD-10-CM | POA: Diagnosis not present

## 2022-04-06 DIAGNOSIS — F1290 Cannabis use, unspecified, uncomplicated: Secondary | ICD-10-CM | POA: Insufficient documentation

## 2022-04-06 DIAGNOSIS — O99333 Smoking (tobacco) complicating pregnancy, third trimester: Secondary | ICD-10-CM

## 2022-04-06 DIAGNOSIS — O99213 Obesity complicating pregnancy, third trimester: Secondary | ICD-10-CM

## 2022-04-06 NOTE — Procedures (Signed)
April James Apr 17, 1990 [redacted]w[redacted]d Fetus A Non-Stress Test Interpretation for 04/06/22  Indication: IUGR  Fetal Heart Rate A Mode: External Baseline Rate (A): 140 bpm Variability: Moderate Accelerations: 10 x 10 Decelerations: None Multiple birth?: No  Uterine Activity Mode: Palpation, Toco Contraction Frequency (min): none Resting Tone Palpated: Relaxed  Interpretation (Fetal Testing) Nonstress Test Interpretation: Reactive Overall Impression: Reassuring for gestational age Comments: Dr. SDonalee Citrinreviewed tracing

## 2022-04-07 ENCOUNTER — Other Ambulatory Visit: Payer: Self-pay | Admitting: *Deleted

## 2022-04-07 DIAGNOSIS — O99333 Smoking (tobacco) complicating pregnancy, third trimester: Secondary | ICD-10-CM

## 2022-04-07 DIAGNOSIS — O99213 Obesity complicating pregnancy, third trimester: Secondary | ICD-10-CM

## 2022-04-07 DIAGNOSIS — O365930 Maternal care for other known or suspected poor fetal growth, third trimester, not applicable or unspecified: Secondary | ICD-10-CM

## 2022-04-07 DIAGNOSIS — F1290 Cannabis use, unspecified, uncomplicated: Secondary | ICD-10-CM

## 2022-04-07 MED ORDER — HYDROXYZINE PAMOATE 25 MG PO CAPS: 25.0000 mg | ORAL_CAPSULE | Freq: Three times a day (TID) | ORAL | 11 refills | Status: AC | PRN

## 2022-04-13 ENCOUNTER — Ambulatory Visit: Payer: Medicaid Other

## 2022-04-19 ENCOUNTER — Encounter: Payer: Self-pay | Admitting: Obstetrics & Gynecology

## 2022-04-19 ENCOUNTER — Ambulatory Visit (INDEPENDENT_AMBULATORY_CARE_PROVIDER_SITE_OTHER): Payer: Medicaid Other | Admitting: Obstetrics & Gynecology

## 2022-04-19 VITALS — BP 128/78 | HR 84 | Wt 220.0 lb

## 2022-04-19 DIAGNOSIS — O0990 Supervision of high risk pregnancy, unspecified, unspecified trimester: Secondary | ICD-10-CM

## 2022-04-19 DIAGNOSIS — O365930 Maternal care for other known or suspected poor fetal growth, third trimester, not applicable or unspecified: Secondary | ICD-10-CM

## 2022-04-19 DIAGNOSIS — Z3A32 32 weeks gestation of pregnancy: Secondary | ICD-10-CM

## 2022-04-19 DIAGNOSIS — O99213 Obesity complicating pregnancy, third trimester: Secondary | ICD-10-CM

## 2022-04-19 NOTE — Patient Instructions (Signed)
Return to office for any scheduled appointments. Call the office or go to the MAU at [facility] [place] if: You begin to have strong, frequent contractions Your water breaks.  Sometimes it is a big gush of fluid, sometimes it is just a trickle that keeps getting your underwear wet or running down your legs You have vaginal bleeding.  It is normal to have a small amount of spotting if your cervix was checked.  You do not feel your baby moving like normal.  If you do not, get something to eat and drink and lay down and focus on feeling your baby move.   If your baby is still not moving like normal, you should call the office or go to MAU. Any other obstetric concerns.  

## 2022-04-19 NOTE — Progress Notes (Signed)
PRENATAL VISIT NOTE  Subjective:  April James is a 32 y.o. [id number] at [redacted]w[redacted]d being seen today for ongoing prenatal care.  She is currently monitored for the following issues for this high-risk pregnancy and has Bipolar disorder with current episode depressed ([place]); Alcohol use disorder, mild, abuse; Bipolar affective disorder, currently depressed, moderate ([place]); Supervision of high risk pregnancy, antepartum; Tobacco use affecting pregnancy, antepartum; Chlamydia infection affecting pregnancy in second trimester; Maternal morbid obesity, antepartum ([place]); Bipolar disease in pregnancy in third trimester ([place]); BMI 36.0-36.9,adult; and IUGR (intrauterine growth restriction) affecting care of mother, third trimester on their problem list.  Patient reports no complaints.  Contractions: Irritability. Vag. Bleeding: None.  Movement: Present. Denies leaking of fluid.   The following portions of the patient's history were reviewed and updated as appropriate: allergies, current medications, past family history, past medical history, past social history, past surgical history and problem list.   Objective:   Vitals:   04/19/22 1552  BP: 128/78  Pulse: 84  Weight: 220 lb (99.8 kg)    Fetal Status: Fetal Heart Rate (bpm): 154   Movement: Present     General:  Alert, oriented and cooperative. Patient is in no acute distress.  Skin: Skin is warm and dry. No rash noted.   Cardiovascular: Normal heart rate noted  Respiratory: Normal respiratory effort, no problems with respiration noted  Abdomen: Soft, gravid, appropriate for gestational age.  Pain/Pressure: Present     Pelvic: Cervical exam deferred        Extremities: Normal range of motion.  Edema: Trace  Mental Status: Normal mood and affect. Normal behavior. Normal judgment and thought content.   [place] MFM OB FOLLOW UP  Result Date: 04/06/2022 ----------------------------------------------------------------------  OBSTETRICS REPORT                        (Signed Final 04/06/2022 04:31 pm) ---------------------------------------------------------------------- Patient Info  ID #:       [id number]                          D.O.B.:  09/07/1990 (32 yrs)  Name:       April Edgell                   Visit Date: 04/06/2022 02:53 pm ---------------------------------------------------------------------- Performed By  Attending:        Tama High MD        Ref. Address:     [place]  Performed By:     Germain Osgood            Secondary Phy.:   Crissie Figures                    RDMS  [place]  Referred By:      [place]       Location[facility]         [facility]                                                             [place] for                                                             Women ---------------------------------------------------------------------- Orders  #  Description                           Code        Ordered By  1  [place] MFM OB FOLLOW UP                   [phone]    Peterson Ao  2  [place] MFM UA CORD DOPPLER                [id number]    YU FANG ----------------------------------------------------------------------  #  Order #                     Accession #                Episode #  1  [id number]                   [id number]                 [id number]  2  [id number]                   [id number]                 [id number] ---------------------------------------------------------------------- Indications  Maternal care for known or suspected poor      O36.5930  fetal growth, third trimester, not applicable or  unspecified IUGR  Obesity complicating pregnancy, third          O99.213  trimester (BMI 32)  Tobacco use complicating pregnancy, third      O99.333  trimester  Substance abuse affecting pregnancy,           O99.320 F19.10  antepartum (Marijuana; from  prenatal  records)  LR NIPS/neg AFP/neg Horizon  [redacted] weeks gestation of pregnancy                Z3A.30 ---------------------------------------------------------------------- Fetal Evaluation  Num Of Fetuses:         1  Fetal Heart Rate(bpm):  157  Cardiac Activity:       Observed  Presentation:  Cephalic  Placenta:               Posterior  P. Cord Insertion:      Previously visualized  Amniotic Fluid  AFI FV:      Within normal limits  AFI Sum(cm)     %Tile       Largest Pocket(cm)  15.58           56          5.49  RUQ(cm)       RLQ(cm)       LUQ(cm)        LLQ(cm)  5.49          1.89          3.85           4.35 ---------------------------------------------------------------------- Biometry  BPD:      71.6  mm     G. Age:  28w 5d          5  %    CI:        73.62   %    70 - 86                                                          FL/HC:      19.1   %    19.2 - 21.4  HC:      265.1  mm     G. Age:  28w 6d        1.5  %    HC/AC:      1.07        0.99 - 1.21  AC:       248   mm     G. Age:  29w 0d         13  %    FL/BPD:     70.7   %    71 - 87  FL:       50.6  mm     G. Age:  27w 1d        < 1  %    FL/AC:      20.4   %    20 - 24  HUM:      47.6  mm     G. Age:  28w 0d          5  %  LV:        5.3  mm  Est. FW:    1217  gm    2 lb 11 oz     2.6  % ---------------------------------------------------------------------- OB History  Gravidity:    4         Term:   1        Prem:   0        SAB:   1  TOP:          1       Ectopic:  0        Living: 1 ---------------------------------------------------------------------- Gestational Age  LMP:           30w 2d        Date:  09/06/21                   EDD:  06/13/22  U/S Today:     28w 3d                                        EDD:   06/26/22  Best:          30w 2d     Det. By:  LMP  (09/06/21)          EDD:   06/13/22 ---------------------------------------------------------------------- Anatomy  Cranium:               Appears normal         LVOT:                    Previously seen  Cavum:                 Appears normal         Aortic Arch:            Previously seen  Ventricles:            Appears normal         Ductal Arch:            Previously seen  Choroid Plexus:        Previously seen        Diaphragm:              Appears normal  Cerebellum:            Previously seen        Stomach:                Appears normal, left                                                                        sided  Posterior Fossa:       Previously seen        Abdomen:                Previously seen  Nuchal Fold:           Not applicable (Q000111Q    Abdominal Wall:         Previously seen                         wks GA)  Face:                  Orbits and profile     Cord Vessels:           Previously seen                         previously seen  Lips:                  Previously seen        Kidneys:                Appear normal  Palate:                Appears normal  Bladder:                Appears normal  Thoracic:              Previously seen        Spine:                  Previously seen  Heart:                 Previously seen        Upper Extremities:      Previously seen  RVOT:                  Previously seen        Lower Extremities:      Previously seen  Other:  Fetus prev appears to be a female. VC, Falx, Right Heel and Left foot          prev visualized.  Left heel, right foot, Palate, 3VV, 3VTV, hands/5th          prev visualized. Technically difficult due to fetal position. ---------------------------------------------------------------------- Doppler - Fetal Vessels  Umbilical Artery   S/D     %tile      RI    %tile      PI    %tile     PSV    ADFV    RDFV                                                     (cm/s)   3.01       61    0.67       66    1.04       69    46.96      No      No ---------------------------------------------------------------------- Cervix Uterus Adnexa  Cervix  Not visualized (advanced GA >24wks)  Uterus  No abnormality visualized.  Right Ovary   Not visualized.  Left Ovary  Not visualized.  Cul De Sac  No free fluid seen.  Adnexa  No adnexal mass visualized ---------------------------------------------------------------------- Impression  Fetal growth restriction.  On ultrasound performed 3 weeks  ago, the estimated fetal weight was at the 8th percentile and  the abdominal circumference measurement was at the 33rd  percentile.  Blood pressure today at her office is 120/68 mmHg.  Amniotic fluid is normal and good fetal activity seen.  The  estimated fetal weight is at the 3rd percentile and the  abdominal circumference measurement at the 13th  percentile.  Interval weight gain is 297 g.  Vehicle artery  Doppler showed normal forward diastolic flow.  NST is  reactive.  I explained the finding of severe fetal growth restriction and  ultrasound protocol of monitoring growth restriction. ---------------------------------------------------------------------- Recommendations  -Weekly antenatal testing to continue.  -BPP from [redacted] weeks gestation till delivery. ----------------------------------------------------------------------                 Tama High, MD Electronically Signed Final Report   04/06/2022 04:31 pm ----------------------------------------------------------------------  [place] MFM UA CORD DOPPLER  Result Date: 04/06/2022 ----------------------------------------------------------------------  OBSTETRICS REPORT                       (Signed Final 04/06/2022 04:31 pm) ---------------------------------------------------------------------- Patient Info  ID #:  [id number]                          D.O.B.:  [birth year] (32 yrs)  Name:       April James                   Visit Date: 04/06/2022 02:53 pm ---------------------------------------------------------------------- Performed By  Attending:        Tama High MD        Ref. Address:     [place]  Performed By:     Germain Osgood             Secondary Phy.:   [place]  Referred By:      [place]       Location:         [facility] at                                                             [place] for                                                             Women ---------------------------------------------------------------------- Orders  #  Description                           Code        Ordered By  1  [place] MFM OB FOLLOW UP                   [id number]    YU FANG  2  [place] MFM UA CORD DOPPLER                [id number]    YU FANG ----------------------------------------------------------------------  #  Order #  Accession #                Episode #  1  [id number]                   [id number]                 [id number]  2  [id number]                   [id number]                 [id number] ---------------------------------------------------------------------- Indications  Maternal care for known or suspected poor      O36.5930  fetal growth, third trimester, not applicable or  unspecified IUGR  Obesity complicating pregnancy, third          O99.213  trimester (BMI 32)  Tobacco use complicating pregnancy, third      O99.333  trimester  Substance abuse affecting pregnancy,           O99.320 F19.10  antepartum (Marijuana; from prenatal  records)  LR NIPS/neg AFP/neg Horizon  [redacted] weeks gestation of pregnancy                Z3A.30 ---------------------------------------------------------------------- Fetal Evaluation  Num Of Fetuses:         1  Fetal Heart Rate(bpm):  157  Cardiac Activity:       Observed  Presentation:           Cephalic  Placenta:               Posterior  P. Cord Insertion:      Previously visualized  Amniotic Fluid  AFI FV:      Within normal limits  AFI Sum(cm)     %Tile       Largest Pocket(cm)  15.58           56          5.49  RUQ(cm)        RLQ(cm)       LUQ(cm)        LLQ(cm)  5.49          1.89          3.85           4.35 ---------------------------------------------------------------------- Biometry  BPD:      71.6  mm     G. Age:  28w 5d          5  %    CI:        73.62   %    70 - 86                                                          FL/HC:      19.1   %    19.2 - 21.4  HC:      265.1  mm     G. Age:  28w 6d        1.5  %    HC/AC:      1.07        0.99 - 1.21  AC:       248   mm     G. Age:  29w 0d         13  %  FL/BPD:     70.7   %    71 - 87  FL:       50.6  mm     G. Age:  27w 1d        < 1  %    FL/AC:      20.4   %    20 - 24  HUM:      47.6  mm     G. Age:  28w 0d          5  %  LV:        5.3  mm  Est. FW:    1217  gm    2 lb 11 oz     2.6  % ---------------------------------------------------------------------- OB History  Gravidity:    4         Term:   1        Prem:   0        SAB:   1  TOP:          1       Ectopic:  0        Living: 1 ---------------------------------------------------------------------- Gestational Age  LMP:           30w 2d        Date:  09/06/21                   EDD:   06/13/22  U/S Today:     28w 3d                                        EDD:   06/26/22  Best:          30w 2d     Det. By:  LMP  (09/06/21)          EDD:   06/13/22 ---------------------------------------------------------------------- Anatomy  Cranium:               Appears normal         LVOT:                   Previously seen  Cavum:                 Appears normal         Aortic Arch:            Previously seen  Ventricles:            Appears normal         Ductal Arch:            Previously seen  Choroid Plexus:        Previously seen        Diaphragm:              Appears normal  Cerebellum:            Previously seen        Stomach:                Appears normal, left  sided  Posterior Fossa:       Previously seen        Abdomen:                Previously seen   Nuchal Fold:           Not applicable (Q000111Q    Abdominal Wall:         Previously seen                         wks GA)  Face:                  Orbits and profile     Cord Vessels:           Previously seen                         previously seen  Lips:                  Previously seen        Kidneys:                Appear normal  Palate:                Appears normal         Bladder:                Appears normal  Thoracic:              Previously seen        Spine:                  Previously seen  Heart:                 Previously seen        Upper Extremities:      Previously seen  RVOT:                  Previously seen        Lower Extremities:      Previously seen  Other:  Fetus prev appears to be a female. VC, Falx, Right Heel and Left foot          prev visualized.  Left heel, right foot, Palate, 3VV, 3VTV, hands/5th          prev visualized. Technically difficult due to fetal position. ---------------------------------------------------------------------- Doppler - Fetal Vessels  Umbilical Artery   S/D     %tile      RI    %tile      PI    %tile     PSV    ADFV    RDFV                                                     (cm/s)   3.01       61    0.67       66    1.04       69    46.96      No      No ---------------------------------------------------------------------- Cervix Uterus Adnexa  Cervix  Not visualized (advanced GA >24wks)  Uterus  No abnormality visualized.  Right Ovary  Not visualized.  Left Ovary  Not visualized.  Cul De Sac  No free fluid seen.  Adnexa  No adnexal mass visualized ---------------------------------------------------------------------- Impression  Fetal growth restriction.  On ultrasound performed 3 weeks  ago, the estimated fetal weight was at the 8th percentile and  the abdominal circumference measurement was at the 33rd  percentile.  Blood pressure today at her office is 120/68 mmHg.  Amniotic fluid is normal and good fetal activity seen.  The  estimated fetal weight is at the  3rd percentile and the  abdominal circumference measurement at the 13th  percentile.  Interval weight gain is 297 g.  Vehicle artery  Doppler showed normal forward diastolic flow.  NST is  reactive.  I explained the finding of severe fetal growth restriction and  ultrasound protocol of monitoring growth restriction. ---------------------------------------------------------------------- Recommendations  -Weekly antenatal testing to continue.  -BPP from [redacted] weeks gestation till delivery. ----------------------------------------------------------------------                 Tama High, MD Electronically Signed Final Report   04/06/2022 04:31 pm ----------------------------------------------------------------------  [place] MFM UA CORD DOPPLER  Result Date: 03/30/2022 ----------------------------------------------------------------------  OBSTETRICS REPORT                       (Signed Final 03/30/2022 02:59 pm) ---------------------------------------------------------------------- Patient Info  ID #:       [id number]                          D.O.B.:  Jun 01, 1990 (32 yrs)  Name:       April James                   Visit Date: 03/30/2022 01:46 pm ---------------------------------------------------------------------- Performed By  Attending:        Valeda Malm DO       Ref. Address:     [place]  Performed By:     Rolm Bookbinder RDMS     Location[facility]         [facility]                                                             [address] for  [facility]  Referred By:      [facility] ---------------------------------------------------------------------- Orders  #  Description                           Code        Ordered By  1  [place] MFM UA CORD DOPPLER                [phone]    Peterson Ao  2  [place] MFM OB LIMITED                      [id number]    YU FANG ----------------------------------------------------------------------  #  Order #                     Accession #                Episode #  1  [id number]                   [id number]                 [id number]  2  [id number]                   [id number]                 [id number] ---------------------------------------------------------------------- Indications  Maternal care for known or suspected poor      O36.5930  fetal growth, third trimester, not applicable or  unspecified IUGR  [redacted] weeks gestation of pregnancy                Q000111Q  Obesity complicating pregnancy, third          O99.213  trimester (BMI 32)  Tobacco use complicating pregnancy, third      O99.333  trimester  Substance abuse affecting pregnancy,           O99.320 F19.10  antepartum (Marijuana; from prenatal  records)  LR NIPS/neg AFP/neg Horizon ---------------------------------------------------------------------- Fetal Evaluation  Num Of Fetuses:         1  Fetal Heart Rate(bpm):  137  Cardiac Activity:       Observed  Presentation:           Cephalic  Placenta:               Posterior  P. Cord Insertion:      Previously visualized  Amniotic Fluid  AFI FV:      Within normal limits  AFI Sum(cm)     %Tile       Largest Pocket(cm)  17.78           67          5.85  RUQ(cm)       RLQ(cm)       LUQ(cm)        LLQ(cm)  5.85          5.1           2.61           4.22 ---------------------------------------------------------------------- OB History  Gravidity:    4         Term:   1        Prem:   0        SAB:   1  TOP:          1       Ectopic:  0        Living: 1 ---------------------------------------------------------------------- Gestational Age  LMP:           29w 2d        Date:  09/06/21                   EDD:   06/13/22  Best:          29w 2d     Det. By:  LMP  (09/06/21)          EDD:   06/13/22 ---------------------------------------------------------------------- Anatomy  Cranium:               Appears normal          Kidneys:                Appear normal  Stomach:               Appears normal, left   Bladder:                Appears normal                         sided  Abdomen:               Appears normal ---------------------------------------------------------------------- Doppler - Fetal Vessels  Umbilical Artery   S/D     %tile      RI    %tile      PI    %tile     PSV    ADFV    RDFV                                                     (cm/s)   3.99       92    0.75       91    1.26       92    48.84      No      No ---------------------------------------------------------------------- Comments  The patient is here for a follow-up ultrasound for FGR at 29w  2d. EDD: 06/13/2022. Dating: LMP  (09/06/21). She has no  concerns today.  I discussed tobacco and marijuana cessation during  pregnancy.  She is going to try to cut back.  She has  NicoDerm patches as well.  I instructed her if she is going to  use any nicotine replacement therapy she should discontinue  all tobacco products.  She verbalized understanding and will  try to cut back.  Sonographic findings  Single intrauterine pregnancy.  Fetal cardiac activity:  Observed and appears normal.  Presentation: Cephalic.  Interval fetal anatomy appears normal  Amniotic fluid volume: Within normal limits. AFI: 17.78 cm.  MVP: 5.85 cm.  Placenta: Posterior.  Umbilical artery dopplers findings:  -S/D:3.99 which are normal at this gestational age.  -Absent end-diastolic flow: No.  -Reversed end-diastolic flow:  No.  NST was reassuring for gestational age.  Recommendations  - Continue weekly UA dopplers and antenatal testing.  - Serial growth [place] every 3 weeks until delivery.  - Delivery timing pending clinical course. ----------------------------------------------------------------------                  Valeda Malm, DO Electronically Signed Final Report   03/30/2022 02:59 pm ----------------------------------------------------------------------  [place] MFM OB  LIMITED  Result Date:  03/30/2022 ----------------------------------------------------------------------  OBSTETRICS REPORT                       (Signed Final 03/30/2022 02:59 pm) ---------------------------------------------------------------------- Patient Info  ID #:       [id number]                          D.O.B.:  Nov 28, 1990 (32 yrs)  Name:       April James                   Visit Date: 03/30/2022 01:46 pm ---------------------------------------------------------------------- Performed By  Attending:        Valeda Malm DO       Ref. Address:     [place]  Performed By:     Rolm Bookbinder RDMS     Location:         [facility] at                                                             [place] for                                                             Women  Referred By:      [facility] ---------------------------------------------------------------------- Orders  #  Description                           Code        Ordered By  1  [place] MFM UA CORD DOPPLER                [phone]    YU FANG  2  [place] MFM OB LIMITED                     [id number]    YU FANG ----------------------------------------------------------------------  #  Order #                     Accession #                Episode #  1  [id number]                   [id number]  [id number]  2  [id number]                   [id number]                 [id number] ---------------------------------------------------------------------- Indications  Maternal care for known or suspected poor      O36.5930  fetal growth, third trimester, not applicable or  unspecified IUGR  [redacted] weeks gestation of pregnancy                Q000111Q  Obesity complicating pregnancy, third          O99.213  trimester (BMI 32)  Tobacco use complicating pregnancy, third      O99.333  trimester  Substance abuse affecting pregnancy,            O99.320 F19.10  antepartum (Marijuana; from prenatal  records)  LR NIPS/neg AFP/neg Horizon ---------------------------------------------------------------------- Fetal Evaluation  Num Of Fetuses:         1  Fetal Heart Rate(bpm):  137  Cardiac Activity:       Observed  Presentation:           Cephalic  Placenta:               Posterior  P. Cord Insertion:      Previously visualized  Amniotic Fluid  AFI FV:      Within normal limits  AFI Sum(cm)     %Tile       Largest Pocket(cm)  17.78           67          5.85  RUQ(cm)       RLQ(cm)       LUQ(cm)        LLQ(cm)  5.85          5.1           2.61           4.22 ---------------------------------------------------------------------- OB History  Gravidity:    4         Term:   1        Prem:   0        SAB:   1  TOP:          1       Ectopic:  0        Living: 1 ---------------------------------------------------------------------- Gestational Age  LMP:           29w 2d        Date:  09/06/21                   EDD:   06/13/22  Best:          29w 2d     Det. By:  LMP  (09/06/21)          EDD:   06/13/22 ---------------------------------------------------------------------- Anatomy  Cranium:               Appears normal         Kidneys:                Appear normal  Stomach:               Appears normal, left   Bladder:                Appears normal  sided  Abdomen:               Appears normal ---------------------------------------------------------------------- Doppler - Fetal Vessels  Umbilical Artery   S/D     %tile      RI    %tile      PI    %tile     PSV    ADFV    RDFV                                                     (cm/s)   3.99       92    0.75       91    1.26       92    48.84      No      No ---------------------------------------------------------------------- Comments  The patient is here for a follow-up ultrasound for FGR at 29w  2d. EDD: 06/13/2022. Dating: LMP  (09/06/21). She has no  concerns today.  I discussed tobacco and  marijuana cessation during  pregnancy.  She is going to try to cut back.  She has  NicoDerm patches as well.  I instructed her if she is going to  use any nicotine replacement therapy she should discontinue  all tobacco products.  She verbalized understanding and will  try to cut back.  Sonographic findings  Single intrauterine pregnancy.  Fetal cardiac activity:  Observed and appears normal.  Presentation: Cephalic.  Interval fetal anatomy appears normal  Amniotic fluid volume: Within normal limits. AFI: 17.78 cm.  MVP: 5.85 cm.  Placenta: Posterior.  Umbilical artery dopplers findings:  -S/D:3.99 which are normal at this gestational age.  -Absent end-diastolic flow: No.  -Reversed end-diastolic flow:  No.  NST was reassuring for gestational age.  Recommendations  - Continue weekly UA dopplers and antenatal testing.  - Serial growth [place] every 3 weeks until delivery.  - Delivery timing pending clinical course. ----------------------------------------------------------------------                  Valeda Malm, DO Electronically Signed Final Report   03/30/2022 02:59 pm ----------------------------------------------------------------------  [place] MFM UA CORD DOPPLER  Result Date: 03/23/2022 ----------------------------------------------------------------------  OBSTETRICS REPORT                       (Signed Final 03/23/2022 10:57 am) ---------------------------------------------------------------------- Patient Info  ID #:       [id number]                          D.O.B.:  1990-10-06 (32 yrs)  Name:       April Lobos                   Visit Date: 03/23/2022 09:30 am ---------------------------------------------------------------------- Performed By  Attending:        Johnell Comings MD         Ref. Address:     [address]  Performed By:     Nathen May       Secondary Phy.:   [place]  Referred By:      [facility]       [facility]                                                             [facility]                                                             [facility] ---------------------------------------------------------------------- Orders  #  Description                           Code        Ordered By  1  [place] MFM UA CORD DOPPLER                [phone]    Peterson Ao  2  [place] MFM OB LIMITED                     [id number]    YU FANG ----------------------------------------------------------------------  #  Order #                     Accession #                Episode #  1  [id number]                   [id number]                 [id number]  2  [id number]                   Accident:[id number]                 [id number] ---------------------------------------------------------------------- Indications  Maternal care for known or suspected poor      O36.5930  fetal growth, third trimester, not applicable or  unspecified IUGR  Obesity complicating pregnancy, third          O99.213  trimester (BMI 32)  Tobacco use complicating pregnancy, third  M01.027  trimester  Substance abuse affecting pregnancy,           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 Cord Insertion:      Previously visualized  Amniotic Fluid  AFI FV:      Within normal limits  AFI Sum(cm)     %Tile       Largest  Pocket(cm)  18.67           72          7.19  RUQ(cm)       RLQ(cm)       LUQ(cm)        LLQ(cm)  7.19          2.34          3.31           5.83 ---------------------------------------------------------------------- OB History  Gravidity:    4         Term:   1        Prem:   0        SAB:   1  TOP:          1       Ectopic:  0        Living: 1 ---------------------------------------------------------------------- Gestational Age  LMP:           28w 2d        Date:  09/06/21                   EDD:   06/13/22  Best:          April James 2d     Det. By:  LMP  (09/06/21)          EDD:   06/13/22 ---------------------------------------------------------------------- Anatomy  Diaphragm:             Appears normal         Kidneys:                Appear normal  Stomach:               Appears normal, left   Bladder:                Appears normal                         sided ---------------------------------------------------------------------- Doppler - Fetal Vessels  Umbilical Artery   S/D     %tile      RI    %tile      PI    %tile  PSV    ADFV    RDFV                                                     (cm/s)   3.13       57    0.68       61    0.96       42    53.36      No      No ---------------------------------------------------------------------- Comments  This patient was seen due to an IUGR fetus.  She denies any  problems since her last exam.  She reports feeling vigorous  fetal movements throughout the day.  She had a reactive NST for her gestational age today.  The total AFI was 18.67 cm (within normal limits).  Doppler studies of the umbilical arteries performed due to  fetal growth restriction showed a normal S/D ratio of 3.13.  There were no signs of absent or reversed end-diastolic flow  noted today.  She will return in 1 week for another NST and umbilical artery  Doppler study. ----------------------------------------------------------------------                   Johnell Comings, MD Electronically Signed Final  Report   03/23/2022 10:57 am ----------------------------------------------------------------------  [place] MFM OB LIMITED  Result Date: 03/23/2022 ----------------------------------------------------------------------  OBSTETRICS REPORT                       (Signed Final 03/23/2022 10:57 am) ---------------------------------------------------------------------- Patient Info  ID #:       [id number]                          D.O.B.:  1990/07/06 (32 yrs)  Name:       April Dorian                   Visit Date: 03/23/2022 09:30 am ---------------------------------------------------------------------- Performed By  Attending:        Johnell Comings MD         Ref. Address:     [place]  Performed By:     Nathen May       Secondary Phy.:   [place]  Referred By:      [place]       Location:         [facility]                                                             [place] [facility]                                                             [facility] ---------------------------------------------------------------------- Orders  #  Description                           Code        Ordered By  1  [place] MFM UA CORD DOPPLER                76820.02    YU FANG  2  [place] MFM OB LIMITED                     [id number]    YU FANG ----------------------------------------------------------------------  #  Order #                     Accession #                Episode #  1  [id number]                   [id number]                 [id number]  2  [id number]                   [id number]                 [id number] ---------------------------------------------------------------------- Indications  Maternal care for known or suspected poor      O36.5930  fetal growth, third trimester, not applicable or   unspecified IUGR  Obesity complicating pregnancy, third          O99.213  trimester (BMI 32)  Tobacco use complicating pregnancy, third      O99.333  trimester  Substance abuse affecting pregnancy,           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 Cord Insertion:      Previously visualized  Amniotic Fluid  AFI FV:      Within normal limits  AFI Sum(cm)     %Tile       Largest Pocket(cm)  18.67           72          7.19  RUQ(cm)       RLQ(cm)       LUQ(cm)        LLQ(cm)  7.19          2.34          3.31           5.83 ---------------------------------------------------------------------- OB History  Gravidity:    4         Term:   1        Prem:   0        SAB:   1  TOP:          1       Ectopic:  0        Living: 1 ---------------------------------------------------------------------- Gestational Age  LMP:           28w 2d        Date:  09/06/21                   EDD:   06/13/22  Best:          April James 2d     Det. By:  LMP  (09/06/21)          EDD:   06/13/22 ---------------------------------------------------------------------- Anatomy  Diaphragm:             Appears normal         Kidneys:                Appear normal  Stomach:               Appears normal, left   Bladder:                Appears normal                         sided ---------------------------------------------------------------------- Doppler - Fetal Vessels  Umbilical Artery   S/D     %tile      RI    %tile      PI    %tile     PSV    ADFV    RDFV                                                      (cm/s)   3.13       57    0.68       61    0.96       42    53.36      No      No ---------------------------------------------------------------------- Comments  This patient was seen due to an IUGR fetus.  She denies any  problems since her last exam.  She reports feeling vigorous  fetal movements throughout the day.  She had a reactive NST for her gestational age today.  The total AFI was 18.67 cm (within normal limits).  Doppler studies of the umbilical arteries performed due to  fetal growth restriction showed a normal S/D ratio of  3.13.  There were no signs of absent or reversed end-diastolic flow  noted today.  She will return in 1 week for another NST and umbilical artery  Doppler study. ----------------------------------------------------------------------                   Johnell Comings, MD Electronically Signed Final Report   03/23/2022 10:57 am ----------------------------------------------------------------------   Assessment and Plan:  Pregnancy: [id number] at [redacted]w[redacted]d 1. IUGR (intrauterine growth restriction) affecting care of mother, third trimester Will follow up scans and BPP as per MFM recommendations. Will follow up their delivery plan.  2. Maternal morbid obesity, antepartum ([place]) Already in testing.  3. [redacted] weeks gestation of pregnancy 4. Supervision of high risk pregnancy, antepartum Preterm labor symptoms and general obstetric precautions including but not limited to vaginal bleeding, contractions, leaking of fluid and fetal movement were reviewed in detail with the patient. Please refer to After Visit Summary for other counseling recommendations.   Return in about 2 weeks (around 05/03/2022) for OFFICE OB VISIT (MD), can be virtual  4 weeks from now: OFFICE visit, pelvic cultures then weekly.  Future Appointments  Date Time Provider [place]  04/20/2022  2:00 PM [facility] NURSE [facility]  04/20/2022  2:15 PM [facility] NST [facility]  04/20/2022  3:00 PM [facility] US1 [facility]  04/26/2022 12:30 PM [facility] NURSE [facility]  04/26/2022 12:45 PM [facility] US5 [facility]  04/26/2022  1:15 PM [facility] NST [facility]  05/03/2022  3:30 PM Vaishnav Demartin, Sallyanne Havers, MD [facility]  05/04/2022  2:00 PM [facility] NURSE [facility]  05/04/2022  2:15 PM [facility] NST [facility]  05/04/2022  3:00 PM [facility] US1 [facility]  05/17/2022  4:10 PM Jalaysha Skilton, Sallyanne Havers, MD [facility]  05/24/2022  3:30 PM Constant, Vickii Chafe, MD [facility]  05/31/2022  3:50 PM Dorraine Ellender, Sallyanne Havers, MD [facility]    Verita Schneiders, MD

## 2022-04-20 ENCOUNTER — Ambulatory Visit: Payer: Medicaid Other

## 2022-04-20 ENCOUNTER — Encounter: Payer: Self-pay | Admitting: Obstetrics & Gynecology

## 2022-04-20 ENCOUNTER — Ambulatory Visit: Payer: Medicaid Other | Attending: Obstetrics and Gynecology

## 2022-04-26 ENCOUNTER — Ambulatory Visit: Payer: Medicaid Other | Admitting: *Deleted

## 2022-04-26 ENCOUNTER — Other Ambulatory Visit: Payer: Self-pay | Admitting: *Deleted

## 2022-04-26 ENCOUNTER — Ambulatory Visit: Payer: Medicaid Other | Attending: Obstetrics and Gynecology

## 2022-04-26 VITALS — BP 134/71 | HR 90

## 2022-04-26 DIAGNOSIS — F1990 Other psychoactive substance use, unspecified, uncomplicated: Secondary | ICD-10-CM | POA: Insufficient documentation

## 2022-04-26 DIAGNOSIS — O99320 Drug use complicating pregnancy, unspecified trimester: Secondary | ICD-10-CM | POA: Diagnosis not present

## 2022-04-26 DIAGNOSIS — Z3A33 33 weeks gestation of pregnancy: Secondary | ICD-10-CM

## 2022-04-26 DIAGNOSIS — O365930 Maternal care for other known or suspected poor fetal growth, third trimester, not applicable or unspecified: Secondary | ICD-10-CM

## 2022-04-26 DIAGNOSIS — E661 Drug-induced obesity: Secondary | ICD-10-CM | POA: Diagnosis not present

## 2022-04-26 DIAGNOSIS — O0990 Supervision of high risk pregnancy, unspecified, unspecified trimester: Secondary | ICD-10-CM | POA: Insufficient documentation

## 2022-04-26 DIAGNOSIS — O99213 Obesity complicating pregnancy, third trimester: Secondary | ICD-10-CM | POA: Diagnosis not present

## 2022-04-26 DIAGNOSIS — F1290 Cannabis use, unspecified, uncomplicated: Secondary | ICD-10-CM | POA: Diagnosis not present

## 2022-04-26 DIAGNOSIS — E669 Obesity, unspecified: Secondary | ICD-10-CM | POA: Diagnosis not present

## 2022-04-26 DIAGNOSIS — O99323 Drug use complicating pregnancy, third trimester: Secondary | ICD-10-CM

## 2022-04-26 DIAGNOSIS — O99333 Smoking (tobacco) complicating pregnancy, third trimester: Secondary | ICD-10-CM | POA: Diagnosis not present

## 2022-04-26 DIAGNOSIS — O365990 Maternal care for other known or suspected poor fetal growth, unspecified trimester, not applicable or unspecified: Secondary | ICD-10-CM

## 2022-04-26 NOTE — Procedures (Signed)
April James [birth year] 104w1d  Fetus A Non-Stress Test Interpretation for 04/26/22  Indication: IUGR  Fetal Heart Rate A Mode: External Baseline Rate (A): 135 bpm Variability: Moderate Accelerations: 15 x 15 Decelerations: Variable Multiple birth?: No  Uterine Activity Mode: Toco Contraction Frequency (min): 1 u/c during NST Contraction Duration (sec): 50 Contraction Quality: Mild Resting Tone Palpated: Relaxed Resting Time: Adequate  Interpretation (Fetal Testing) Nonstress Test Interpretation: Reactive Overall Impression: Reassuring for gestational age Comments: tracing reviewed by Dr. Gertie Exon

## 2022-05-03 ENCOUNTER — Telehealth: Payer: Self-pay

## 2022-05-03 ENCOUNTER — Ambulatory Visit: Payer: Medicaid Other

## 2022-05-03 ENCOUNTER — Encounter: Payer: Medicaid Other | Admitting: Obstetrics & Gynecology

## 2022-05-03 NOTE — Telephone Encounter (Signed)
TC to pt to start virtual visit No Anser today at 3:37pm LVM for pt to return call to start visit or Mychart/Call office to R/S.

## 2022-05-04 ENCOUNTER — Ambulatory Visit: Payer: Medicaid Other

## 2022-05-04 ENCOUNTER — Ambulatory Visit: Payer: Medicaid Other | Attending: Obstetrics and Gynecology

## 2022-05-06 ENCOUNTER — Telehealth: Payer: Self-pay

## 2022-05-06 NOTE — Telephone Encounter (Signed)
Left voicemail for patient to call back and schedule her appointment next week.

## 2022-05-09 ENCOUNTER — Telehealth: Payer: Self-pay

## 2022-05-09 NOTE — Telephone Encounter (Signed)
Left message for patient to call to schedule UA Doppler, Limited, BPP with NST for this week.  Preferably Tuesday 05/10/22 or Wednesday 05/11/22

## 2022-05-10 ENCOUNTER — Telehealth: Payer: Self-pay

## 2022-05-10 NOTE — Telephone Encounter (Signed)
Have made several attempts to contact patient with appointment for UA Dopplers, Limited, and BPP w/NST.  Also sent MyChart message to patient for her to call office to schedule

## 2022-05-13 ENCOUNTER — Ambulatory Visit: Payer: Medicaid Other | Attending: Obstetrics and Gynecology | Admitting: *Deleted

## 2022-05-13 ENCOUNTER — Ambulatory Visit: Payer: Medicaid Other | Admitting: *Deleted

## 2022-05-13 VITALS — BP 128/66 | HR 87

## 2022-05-13 DIAGNOSIS — Z3A35 35 weeks gestation of pregnancy: Secondary | ICD-10-CM | POA: Insufficient documentation

## 2022-05-13 DIAGNOSIS — O99323 Drug use complicating pregnancy, third trimester: Secondary | ICD-10-CM | POA: Insufficient documentation

## 2022-05-13 DIAGNOSIS — O365930 Maternal care for other known or suspected poor fetal growth, third trimester, not applicable or unspecified: Secondary | ICD-10-CM | POA: Insufficient documentation

## 2022-05-13 DIAGNOSIS — O0990 Supervision of high risk pregnancy, unspecified, unspecified trimester: Secondary | ICD-10-CM

## 2022-05-13 DIAGNOSIS — O99213 Obesity complicating pregnancy, third trimester: Secondary | ICD-10-CM | POA: Diagnosis not present

## 2022-05-13 DIAGNOSIS — F1910 Other psychoactive substance abuse, uncomplicated: Secondary | ICD-10-CM | POA: Diagnosis not present

## 2022-05-13 NOTE — Procedures (Signed)
April James [birth year] [redacted]w[redacted]d  Fetus A Non-Stress Test Interpretation for 05/13/22  Indication: IUGR  Fetal Heart Rate A Mode: External Baseline Rate (A): 145 bpm Variability: Moderate Accelerations: 15 x 15 Decelerations: None Multiple birth?: No  Uterine Activity Mode: Toco Contraction Frequency (min): occas Contraction Duration (sec): 40-50 Contraction Quality: Mild Resting Tone Palpated: Relaxed Resting Time: Adequate  Interpretation (Fetal Testing) Nonstress Test Interpretation: Reactive Overall Impression: Reassuring for gestational age Comments: Tracing reviewed byDr. Judeth Cornfield

## 2022-05-17 ENCOUNTER — Ambulatory Visit (INDEPENDENT_AMBULATORY_CARE_PROVIDER_SITE_OTHER): Payer: Medicaid Other | Admitting: Obstetrics & Gynecology

## 2022-05-17 ENCOUNTER — Other Ambulatory Visit (HOSPITAL_COMMUNITY)
Admission: RE | Admit: 2022-05-17 | Discharge: 2022-05-17 | Disposition: A | Discharge status: Home / Self Care | Payer: Medicaid Other | Source: Ambulatory Visit | Attending: Obstetrics & Gynecology | Admitting: Obstetrics & Gynecology

## 2022-05-17 ENCOUNTER — Encounter: Payer: Self-pay | Admitting: *Deleted

## 2022-05-17 VITALS — BP 130/84 | HR 89 | Wt 227.6 lb

## 2022-05-17 DIAGNOSIS — O0990 Supervision of high risk pregnancy, unspecified, unspecified trimester: Secondary | ICD-10-CM | POA: Diagnosis not present

## 2022-05-17 DIAGNOSIS — Z3A36 36 weeks gestation of pregnancy: Secondary | ICD-10-CM | POA: Insufficient documentation

## 2022-05-17 DIAGNOSIS — O365930 Maternal care for other known or suspected poor fetal growth, third trimester, not applicable or unspecified: Secondary | ICD-10-CM

## 2022-05-17 DIAGNOSIS — O99210 Obesity complicating pregnancy, unspecified trimester: Secondary | ICD-10-CM

## 2022-05-17 NOTE — Progress Notes (Signed)
PRENATAL VISIT NOTE  Subjective:  April James is a 32 y.o. [id number] at [redacted]w[redacted]d being seen today for ongoing prenatal care. Has not been seen in office 04/19/22 due to her and her family members being sick.  She is currently monitored for the following issues for this high-risk pregnancy and has Alcohol use disorder, mild, abuse; Supervision of high risk pregnancy, antepartum; Tobacco use affecting pregnancy, antepartum; Obesity in pregnancy, antepartum; Bipolar disease in pregnancy in third trimester; BMI 36.0-36.9,adult; and IUGR (intrauterine growth restriction) affecting care of mother, third trimester on their problem list.  Patient reports no complaints.  Contractions: Not present. Vag. Bleeding: None.  Movement: Present. Denies leaking of fluid.   The following portions of the patient's history were reviewed and updated as appropriate: allergies, current medications, past family history, past medical history, past social history, past surgical history and problem list.   Objective:   Vitals:   05/17/22 1623  BP: 130/84  Pulse: 89  Weight: 227 lb 9.6 oz (103.2 kg)    Fetal Status: Fetal Heart Rate (bpm): 143   Movement: Present     General:  Alert, oriented and cooperative. Patient is in no acute distress.  Skin: Skin is warm and dry. No rash noted.   Cardiovascular: Normal heart rate noted  Respiratory: Normal respiratory effort, no problems with respiration noted  Abdomen: Soft, gravid, appropriate for gestational age.  Pain/Pressure: Absent     Pelvic: Cervical exam performed in the presence of a chaperone Dilation: Closed Effacement (%): Thick   Pelvic cultures done.  Extremities: Normal range of motion.  Edema: Trace  Mental Status: Normal mood and affect. Normal behavior. Normal judgment and thought content.   Imaging: [place] MFM FETAL BPP W/NONSTRESS  Result Date: 04/26/2022 ----------------------------------------------------------------------  OBSTETRICS REPORT                         (Signed Final 04/26/2022 01:28 pm) ---------------------------------------------------------------------- Patient Info  ID #:       [id number]                          D.O.B.:  02-27-1990 (32 yrs)  Name:       April James                   Visit Date: 04/26/2022 12:29 pm ---------------------------------------------------------------------- Performed By  Attending:        Lin Landsman      Ref. Address:      [address]                    [address]                                                              [address]  Performed By:     Reinaldo Raddle            [facility]  Referred By:      [facility] ---------------------------------------------------------------------- Orders  #  Description                           Code        Ordered By  1  [place] MFM FETAL BPP                      [phone]     RAVI [facility]     W/NONSTRESS  2  [place] MFM OB FOLLOW UP                   [id number]    RAVI SHANKAR  3  [place] MFM UA CORD DOPPLER                [id number]    RAVI SHANKAR ----------------------------------------------------------------------  #  Order #                     Accession #                Episode #  1  [id number]                   [id number]                 [id number]  2  [id number]                   [id number]                 [id number]  3  [id number]                   [id number]                 [id number] ---------------------------------------------------------------------- Indications  Maternal care for known or suspected poor       O36.5930  fetal growth, third trimester, not applicable or  unspecified IUGR  Obesity complicating pregnancy, third           O99.213  trimester (BMI 32)  Tobacco use complicating pregnancy, third       O99.333  trimester  Substance abuse  affecting pregnancy,            O99.320 F19.10  antepartum (Marijuana; from prenatal  records)  [redacted] weeks gestation of pregnancy                 Z3A.33  LR NIPS/neg AFP/neg Horizon ---------------------------------------------------------------------- Fetal Evaluation  Num Of Fetuses:          1  Fetal Heart Rate(bpm):   130  Cardiac Activity:        Observed  Presentation:            Cephalic  Placenta:                Posterior  P. Cord Insertion:       Previously visualized  Amniotic Fluid  AFI FV:      Within normal limits  AFI Sum(cm)     %Tile       Largest Pocket(cm)  11.29           28          4.4  RUQ(cm)       RLQ(cm)       LUQ(cm)        LLQ(cm)  4.4           0             4.01           2.88 ---------------------------------------------------------------------- Biophysical Evaluation  Amniotic F.V:   Within normal limits       F. Tone:         Observed  F. Movement:    Observed                   N.S.T:           Reactive  F. Breathing:   Observed                   Score:           10/10 ---------------------------------------------------------------------- Biometry  BPD:      81.6  mm     G. Age:  32w 6d         33  %    CI:        88.06   %    70 - 86                                                          FL/HC:       20.6  %    19.9 - 21.5  HC:      273.6  mm     G. Age:  29w 6d        < 1  %    HC/AC:       1.00       0.96 - 1.11  AC:      273.9  mm     G. Age:  31w 3d         10  %    FL/BPD:      69.0  %    71 - 87  FL:       56.3  mm     G. Age:  29w 4d        < 1  %    FL/AC:       20.6  %    20 - 24  HUM:      51.4  mm     G. Age:  30w 0d        < 5  %  Est. FW:    1647   gm    3 lb 10 oz    2.5  % ---------------------------------------------------------------------- OB History  Gravidity:    4  Term:   1        Prem:   0        SAB:   1  TOP:          1       Ectopic:  0        Living: 1 ---------------------------------------------------------------------- Gestational Age  LMP:            33w 1d        Date:  09/06/21                   EDD:   06/13/22  U/S Today:     31w 0d                                        EDD:   06/28/22  Best:          33w 1d     Det. By:  LMP  (09/06/21)          EDD:   06/13/22 ---------------------------------------------------------------------- Anatomy  Cranium:               Appears normal         LVOT:                   Previously seen  Cavum:                 Appears normal         Aortic Arch:            Previously seen  Ventricles:            Previously seen        Ductal Arch:            Previously seen  Choroid Plexus:        Previously seen        Diaphragm:              Appears normal  Cerebellum:            Previously seen        Stomach:                Appears normal, left                                                                        sided  Posterior Fossa:       Previously seen        Abdomen:                Previously seen  Nuchal Fold:           Not applicable (>20    Abdominal Wall:         Previously seen                         wks GA)  Face:                  Orbits and profile     Cord Vessels:  Previously seen                         previously seen  Lips:                  Previously seen        Kidneys:                Appear normal  Palate:                Appears normal         Bladder:                Appears normal  Thoracic:              Previously seen        Spine:                  Previously seen  Heart:                 Appears normal         Upper Extremities:      Previously seen                         (4CH, axis, and                         situs)  RVOT:                  Previously seen        Lower Extremities:      Previously seen  Other:  Fetus prev appears to be a female. VC, Falx, Right Heel and Left foot          prev visualized.  Left heel, right foot, Palate, 3VV, 3VTV, hands/5th          prev visualized. Technically difficult due to fetal position. ----------------------------------------------------------------------  Doppler - Fetal Vessels  Umbilical Artery    S/D    %tile      RI    %tile      PI    %tile     PSV    ADFV    RDFV                                                     (cm/s)   2.66       54    0.62       59    0.95       64    37.49      No      No ---------------------------------------------------------------------- Cervix Uterus Adnexa  Cervix  Not visualized (advanced GA >24wks)  Uterus  No abnormality visualized.  Right Ovary  Size(cm)     3.86   x   1.83   x  1.32      Vol(ml):  4.88  Within normal limits.  Left Ovary  Not visualized.  Cul De Sac  No free fluid seen.  Adnexa  No adnexal mass visualized ---------------------------------------------------------------------- Impression  Follow up growth due to known severe FGR  Normal interval growth with measurements consistent with  severe FGR.  Good fetal movement and amniotic fluid volume  The biophysical  profile was 10/10  The UA Dopplers are normal without evidence of AEDF or  REDF. ---------------------------------------------------------------------- Recommendations  Continue weekly testing with BPP/NST  Repeat growth in 4 weeks.  Delivery at 37 weeks. ----------------------------------------------------------------------              Lin Landsman, MD Electronically Signed Final Report   04/26/2022 01:28 pm ----------------------------------------------------------------------  [place] MFM OB FOLLOW UP  Result Date: 04/26/2022 ----------------------------------------------------------------------  OBSTETRICS REPORT                        (Signed Final 04/26/2022 01:28 pm) ---------------------------------------------------------------------- Patient Info  ID #:       [id number]                          D.O.B.:  1990-07-17 (32 yrs)  Name:       April James                   Visit Date: 04/26/2022 12:29 pm ---------------------------------------------------------------------- Performed By  Attending:        Lin Landsman      Ref. Address:      [address]                    [address]                                                              [address]  Performed By:     Reinaldo Raddle            [facility]                                      [facility]  Referred By:      [facility] ---------------------------------------------------------------------- Orders  #  Description                           Code        Ordered By  1  [place] MFM FETAL BPP                      08657.8     RAVI [facility]  W/NONSTRESS  2  [place] MFM OB FOLLOW UP                   [id number]    RAVI SHANKAR  3  [place] MFM UA CORD DOPPLER                [id number]    RAVI [facility] ----------------------------------------------------------------------  #  Order #                     Accession #                Episode #  1  [id number]                   [id number]                 [id number]  2  [id number]                   [id number]                 [id number]  3  [id number]                   [id number]                 [id number] ---------------------------------------------------------------------- Indications  Maternal care for known or suspected poor       O36.5930  fetal growth, third trimester, not applicable or  unspecified IUGR  Obesity complicating pregnancy, third           O99.213  trimester (BMI 32)  Tobacco use complicating pregnancy, third       O99.333  trimester  Substance abuse affecting pregnancy,            O99.320 F19.10  antepartum (Marijuana; from prenatal  records)  [redacted] weeks gestation of pregnancy                 Z3A.33  LR NIPS/neg AFP/neg Horizon ---------------------------------------------------------------------- Fetal Evaluation  Num Of Fetuses:          1  Fetal Heart Rate(bpm):   130  Cardiac Activity:        Observed  Presentation:            Cephalic  Placenta:                Posterior  P.  Cord Insertion:       Previously visualized  Amniotic Fluid  AFI FV:      Within normal limits  AFI Sum(cm)     %Tile       Largest Pocket(cm)  11.29           28          4.4  RUQ(cm)       RLQ(cm)       LUQ(cm)        LLQ(cm)  4.4           0             4.01           2.88 ---------------------------------------------------------------------- Biophysical Evaluation  Amniotic F.V:   Within normal limits       F. Tone:         Observed  F. Movement:    Observed                   N.S.T:  Reactive  F. Breathing:   Observed                   Score:           10/10 ---------------------------------------------------------------------- Biometry  BPD:      81.6  mm     G. Age:  32w 6d         33  %    CI:        88.06   %    70 - 86                                                          FL/HC:       20.6  %    19.9 - 21.5  HC:      273.6  mm     G. Age:  29w 6d        < 1  %    HC/AC:       1.00       0.96 - 1.11  AC:      273.9  mm     G. Age:  31w 3d         10  %    FL/BPD:      69.0  %    71 - 87  FL:       56.3  mm     G. Age:  29w 4d        < 1  %    FL/AC:       20.6  %    20 - 24  HUM:      51.4  mm     G. Age:  30w 0d        < 5  %  Est. FW:    1647   gm    3 lb 10 oz    2.5  % ---------------------------------------------------------------------- OB History  Gravidity:    4         Term:   1        Prem:   0        SAB:   1  TOP:          1       Ectopic:  0        Living: 1 ---------------------------------------------------------------------- Gestational Age  LMP:           33w 1d        Date:  09/06/21                   EDD:   06/13/22  U/S Today:     31w 0d                                        EDD:   06/28/22  Best:          33w 1d     Det. By:  LMP  (09/06/21)          EDD:   06/13/22 ---------------------------------------------------------------------- Anatomy  Cranium:               Appears normal  LVOT:                   Previously seen  Cavum:                 Appears normal          Aortic Arch:            Previously seen  Ventricles:            Previously seen        Ductal Arch:            Previously seen  Choroid Plexus:        Previously seen        Diaphragm:              Appears normal  Cerebellum:            Previously seen        Stomach:                Appears normal, left                                                                        sided  Posterior Fossa:       Previously seen        Abdomen:                Previously seen  Nuchal Fold:           Not applicable (>20    Abdominal Wall:         Previously seen                         wks GA)  Face:                  Orbits and profile     Cord Vessels:           Previously seen                         previously seen  Lips:                  Previously seen        Kidneys:                Appear normal  Palate:                Appears normal         Bladder:                Appears normal  Thoracic:              Previously seen        Spine:                  Previously seen  Heart:                 Appears normal         Upper Extremities:      Previously seen                         (  4CH, axis, and                         situs)  RVOT:                  Previously seen        Lower Extremities:      Previously seen  Other:  Fetus prev appears to be a female. VC, Falx, Right Heel and Left foot          prev visualized.  Left heel, right foot, Palate, 3VV, 3VTV, hands/5th          prev visualized. Technically difficult due to fetal position. ---------------------------------------------------------------------- Doppler - Fetal Vessels  Umbilical Artery    S/D    %tile      RI    %tile      PI    %tile     PSV    ADFV    RDFV                                                     (cm/s)   2.66       54    0.62       59    0.95       64    37.49      No      No ---------------------------------------------------------------------- Cervix Uterus Adnexa  Cervix  Not visualized (advanced GA >24wks)  Uterus  No abnormality visualized.  Right  Ovary  Size(cm)     3.86   x   1.83   x  1.32      Vol(ml):  4.88  Within normal limits.  Left Ovary  Not visualized.  Cul De Sac  No free fluid seen.  Adnexa  No adnexal mass visualized ---------------------------------------------------------------------- Impression  Follow up growth due to known severe FGR  Normal interval growth with measurements consistent with  severe FGR.  Good fetal movement and amniotic fluid volume  The biophysical profile was 10/10  The UA Dopplers are normal without evidence of AEDF or  REDF. ---------------------------------------------------------------------- Recommendations  Continue weekly testing with BPP/NST  Repeat growth in 4 weeks.  Delivery at 37 weeks. ----------------------------------------------------------------------              Lin Landsman, MD Electronically Signed Final Report   04/26/2022 01:28 pm ----------------------------------------------------------------------  [place] MFM UA CORD DOPPLER  Result Date: 04/26/2022 ----------------------------------------------------------------------  OBSTETRICS REPORT                        (Signed Final 04/26/2022 01:28 pm) ---------------------------------------------------------------------- Patient Info  ID #:       [id number]                          D.O.B.:  [birth year] (32 yrs)  Name:       April James                   Visit Date: 04/26/2022 12:29 pm ---------------------------------------------------------------------- Performed By  Attending:        Lin Landsman      Ref. Address:      [address]  [address]  Performed By:     Reinaldo Raddle            [facility]                    RD[facility]                                      [facility]  Referred By:      [facility] ---------------------------------------------------------------------- Orders  #  Description                           Code        Ordered By  1  [place] MFM FETAL BPP                      96045.4     RAVI [facility]     W/NONSTRESS  2  [place] MFM OB FOLLOW UP                   [id number]    RAVI SHANKAR  3  [place] MFM UA CORD DOPPLER                09811.91    RAVI [facility] ----------------------------------------------------------------------  #  Order #                     Accession #                Episode #  1  [id number]                   [id number]                 [id number]  2  [id number]                   [id number]                 [id number]  3  [id number]                   [id number]  [id number] ---------------------------------------------------------------------- Indications  Maternal care for known or suspected poor       O36.5930  fetal growth, third trimester, not applicable or  unspecified IUGR  Obesity complicating pregnancy, third           O99.213  trimester (BMI 32)  Tobacco use complicating pregnancy, third       O99.333  trimester  Substance abuse affecting pregnancy,            O99.320 F19.10  antepartum (Marijuana; from prenatal  records)  [redacted] weeks gestation of pregnancy                 Z3A.33  LR NIPS/neg AFP/neg Horizon ---------------------------------------------------------------------- Fetal Evaluation  Num Of Fetuses:          1  Fetal Heart Rate(bpm):   130  Cardiac Activity:        Observed  Presentation:            Cephalic  Placenta:                Posterior  P. Cord Insertion:       Previously visualized  Amniotic Fluid  AFI FV:      Within normal limits  AFI Sum(cm)     %Tile       Largest Pocket(cm)  11.29           28          4.4  RUQ(cm)       RLQ(cm)       LUQ(cm)        LLQ(cm)  4.4           0             4.01           2.88 ---------------------------------------------------------------------- Biophysical Evaluation  Amniotic F.V:   Within normal limits       F. Tone:          Observed  F. Movement:    Observed                   N.S.T:           Reactive  F. Breathing:   Observed                   Score:           10/10 ---------------------------------------------------------------------- Biometry  BPD:      81.6  mm     G. Age:  32w 6d         33  %    CI:        88.06   %    70 - 86                                                          FL/HC:       20.6  %    19.9 - 21.5  HC:      273.6  mm     G. Age:  29w 6d        < 1  %    HC/AC:       1.00       0.96 - 1.11  AC:      273.9  mm  G. Age:  31w 3d         10  %    FL/BPD:      69.0  %    71 - 87  FL:       56.3  mm     G. Age:  29w 4d        < 1  %    FL/AC:       20.6  %    20 - 24  HUM:      51.4  mm     G. Age:  30w 0d        < 5  %  Est. FW:    1647   gm    3 lb 10 oz    2.5  % ---------------------------------------------------------------------- OB History  Gravidity:    4         Term:   1        Prem:   0        SAB:   1  TOP:          1       Ectopic:  0        Living: 1 ---------------------------------------------------------------------- Gestational Age  LMP:           33w 1d        Date:  09/06/21                   EDD:   06/13/22  U/S Today:     31w 0d                                        EDD:   06/28/22  Best:          33w 1d     Det. By:  LMP  (09/06/21)          EDD:   06/13/22 ---------------------------------------------------------------------- Anatomy  Cranium:               Appears normal         LVOT:                   Previously seen  Cavum:                 Appears normal         Aortic Arch:            Previously seen  Ventricles:            Previously seen        Ductal Arch:            Previously seen  Choroid Plexus:        Previously seen        Diaphragm:              Appears normal  Cerebellum:            Previously seen        Stomach:                Appears normal, left  sided  Posterior Fossa:       Previously seen         Abdomen:                Previously seen  Nuchal Fold:           Not applicable (>20    Abdominal Wall:         Previously seen                         wks GA)  Face:                  Orbits and profile     Cord Vessels:           Previously seen                         previously seen  Lips:                  Previously seen        Kidneys:                Appear normal  Palate:                Appears normal         Bladder:                Appears normal  Thoracic:              Previously seen        Spine:                  Previously seen  Heart:                 Appears normal         Upper Extremities:      Previously seen                         (4CH, axis, and                         situs)  RVOT:                  Previously seen        Lower Extremities:      Previously seen  Other:  Fetus prev appears to be a female. VC, Falx, Right Heel and Left foot          prev visualized.  Left heel, right foot, Palate, 3VV, 3VTV, hands/5th          prev visualized. Technically difficult due to fetal position. ---------------------------------------------------------------------- Doppler - Fetal Vessels  Umbilical Artery    S/D    %tile      RI    %tile      PI    %tile     PSV    ADFV    RDFV                                                     (cm/s)   2.66       54    0.62       59  0.95       64    37.49      No      No ---------------------------------------------------------------------- Cervix Uterus Adnexa  Cervix  Not visualized (advanced GA >24wks)  Uterus  No abnormality visualized.  Right Ovary  Size(cm)     3.86   x   1.83   x  1.32      Vol(ml):  4.88  Within normal limits.  Left Ovary  Not visualized.  Cul De Sac  No free fluid seen.  Adnexa  No adnexal mass visualized ---------------------------------------------------------------------- Impression  Follow up growth due to known severe FGR  Normal interval growth with measurements consistent with  severe FGR.  Good fetal movement and amniotic fluid volume   The biophysical profile was 10/10  The UA Dopplers are normal without evidence of AEDF or  REDF. ---------------------------------------------------------------------- Recommendations  Continue weekly testing with BPP/NST  Repeat growth in 4 weeks.  Delivery at 37 weeks. ----------------------------------------------------------------------              Lin Landsman, MD Electronically Signed Final Report   04/26/2022 01:28 pm ----------------------------------------------------------------------   Assessment and Plan:  Pregnancy: [id number] at [redacted]w[redacted]d 1. IUGR (intrauterine growth restriction) affecting care of mother, third trimester Emphasized importance of following up with MFM antenatal testing and ultrasounds, also emphasized need for IOL at 37 weeks for severe IUGR.  IOL scheduled on 05/24/22 at midnight, told patient to go to [facility] at 11:45 pm on 05/23/22. Orders signed and held.  2. Obesity in pregnancy, antepartum TWG 52 lbs.  3. [redacted] weeks gestation of pregnancy 4. Supervision of high risk pregnancy, antepartum Pelvic cultures done, will follow up results and manage accordingly. - Cervicovaginal ancillary only - Strep Gp B Culture+Rflx Preterm labor symptoms and general obstetric precautions including but not limited to vaginal bleeding, contractions, leaking of fluid and fetal movement were reviewed in detail with the patient. Please refer to After Visit Summary for other counseling recommendations.   Return for Postpartum check.  Future Appointments  Date Time Provider Department Center  05/18/2022  2:00 PM [facility] NURSE [facility]  05/18/2022  2:15 PM [facility] NST [facility]  05/18/2022  3:00 PM [facility] US1 [facility]  05/24/2022 12:00 AM [facility] SCHED ROOM [facility] None  05/24/2022  7:30 AM [facility] NURSE [facility]  05/24/2022  7:45 AM [facility] US6 [facility]  05/24/2022  8:45 AM [facility] NST [facility]    Jaynie Collins, MD

## 2022-05-18 ENCOUNTER — Encounter: Payer: Self-pay | Admitting: Obstetrics & Gynecology

## 2022-05-18 ENCOUNTER — Encounter (HOSPITAL_COMMUNITY): Payer: Self-pay

## 2022-05-18 ENCOUNTER — Ambulatory Visit: Payer: Medicaid Other | Admitting: *Deleted

## 2022-05-18 ENCOUNTER — Other Ambulatory Visit: Payer: Self-pay | Admitting: Advanced Practice Midwife

## 2022-05-18 ENCOUNTER — Ambulatory Visit: Payer: Medicaid Other | Attending: Maternal & Fetal Medicine

## 2022-05-18 ENCOUNTER — Telehealth (HOSPITAL_COMMUNITY): Payer: Self-pay | Admitting: *Deleted

## 2022-05-18 VITALS — BP 120/59 | HR 91

## 2022-05-18 DIAGNOSIS — Z3A36 36 weeks gestation of pregnancy: Secondary | ICD-10-CM

## 2022-05-18 DIAGNOSIS — O0990 Supervision of high risk pregnancy, unspecified, unspecified trimester: Secondary | ICD-10-CM | POA: Diagnosis not present

## 2022-05-18 DIAGNOSIS — O365930 Maternal care for other known or suspected poor fetal growth, third trimester, not applicable or unspecified: Secondary | ICD-10-CM

## 2022-05-18 DIAGNOSIS — O99213 Obesity complicating pregnancy, third trimester: Secondary | ICD-10-CM | POA: Insufficient documentation

## 2022-05-18 DIAGNOSIS — F1210 Cannabis abuse, uncomplicated: Secondary | ICD-10-CM

## 2022-05-18 DIAGNOSIS — O99323 Drug use complicating pregnancy, third trimester: Secondary | ICD-10-CM | POA: Diagnosis not present

## 2022-05-18 DIAGNOSIS — O365990 Maternal care for other known or suspected poor fetal growth, unspecified trimester, not applicable or unspecified: Secondary | ICD-10-CM | POA: Insufficient documentation

## 2022-05-18 DIAGNOSIS — E669 Obesity, unspecified: Secondary | ICD-10-CM | POA: Diagnosis not present

## 2022-05-18 DIAGNOSIS — O99333 Smoking (tobacco) complicating pregnancy, third trimester: Secondary | ICD-10-CM | POA: Diagnosis not present

## 2022-05-18 NOTE — Procedures (Signed)
April James Oct 27, 1990 [redacted]w[redacted]d  Fetus A Non-Stress Test Interpretation for 05/18/22  Indication: IUGR  Fetal Heart Rate A Mode: External Baseline Rate (A): 140 bpm Variability: Moderate Accelerations: 15 x 15 Decelerations: None Multiple birth?: No  Uterine Activity Mode: Toco Contraction Frequency (min): occas Contraction Duration (sec): 50 Contraction Quality: Mild Resting Tone Palpated: Relaxed Resting Time: Adequate  Interpretation (Fetal Testing) Nonstress Test Interpretation: Reactive Overall Impression: Reassuring for gestational age Comments: tracing reviewed by Dr. Grace Bushy

## 2022-05-18 NOTE — Telephone Encounter (Signed)
Preadmission screen  

## 2022-05-19 ENCOUNTER — Telehealth (HOSPITAL_COMMUNITY): Payer: Self-pay | Admitting: *Deleted

## 2022-05-19 ENCOUNTER — Encounter (HOSPITAL_COMMUNITY): Payer: Self-pay | Admitting: *Deleted

## 2022-05-19 LAB — CERVICOVAGINAL ANCILLARY ONLY
Chlamydia: NEGATIVE
Comment: NEGATIVE
Comment: NORMAL
Neisseria Gonorrhea: NEGATIVE

## 2022-05-19 NOTE — Telephone Encounter (Signed)
Preadmission screen  

## 2022-05-21 LAB — STREP GP B CULTURE+RFLX: Strep Gp B Culture+Rflx: NEGATIVE

## 2022-05-24 ENCOUNTER — Inpatient Hospital Stay (HOSPITAL_COMMUNITY): Payer: Medicaid Other | Admitting: Anesthesiology

## 2022-05-24 ENCOUNTER — Ambulatory Visit: Payer: Medicaid Other

## 2022-05-24 ENCOUNTER — Inpatient Hospital Stay (HOSPITAL_COMMUNITY)
Admission: RE | Admit: 2022-05-24 | Discharge: 2022-05-27 | DRG: 807 | Disposition: A | Discharge status: Home / Self Care | Payer: Medicaid Other | Attending: Obstetrics and Gynecology | Admitting: Obstetrics and Gynecology

## 2022-05-24 ENCOUNTER — Inpatient Hospital Stay (HOSPITAL_COMMUNITY): Payer: Medicaid Other

## 2022-05-24 ENCOUNTER — Encounter (HOSPITAL_COMMUNITY): Payer: Self-pay | Admitting: Obstetrics & Gynecology

## 2022-05-24 ENCOUNTER — Encounter: Payer: Medicaid Other | Admitting: Obstetrics and Gynecology

## 2022-05-24 ENCOUNTER — Other Ambulatory Visit: Payer: Self-pay

## 2022-05-24 DIAGNOSIS — O99343 Other mental disorders complicating pregnancy, third trimester: Secondary | ICD-10-CM | POA: Diagnosis present

## 2022-05-24 DIAGNOSIS — Z818 Family history of other mental and behavioral disorders: Secondary | ICD-10-CM

## 2022-05-24 DIAGNOSIS — O139 Gestational [pregnancy-induced] hypertension without significant proteinuria, unspecified trimester: Secondary | ICD-10-CM | POA: Insufficient documentation

## 2022-05-24 DIAGNOSIS — F319 Bipolar disorder, unspecified: Secondary | ICD-10-CM | POA: Diagnosis present

## 2022-05-24 DIAGNOSIS — Z7982 Long term (current) use of aspirin: Secondary | ICD-10-CM | POA: Diagnosis not present

## 2022-05-24 DIAGNOSIS — F17210 Nicotine dependence, cigarettes, uncomplicated: Secondary | ICD-10-CM | POA: Diagnosis present

## 2022-05-24 DIAGNOSIS — O99334 Smoking (tobacco) complicating childbirth: Secondary | ICD-10-CM | POA: Diagnosis not present

## 2022-05-24 DIAGNOSIS — Z880 Allergy status to penicillin: Secondary | ICD-10-CM | POA: Diagnosis not present

## 2022-05-24 DIAGNOSIS — O99330 Smoking (tobacco) complicating pregnancy, unspecified trimester: Secondary | ICD-10-CM | POA: Diagnosis present

## 2022-05-24 DIAGNOSIS — O134 Gestational [pregnancy-induced] hypertension without significant proteinuria, complicating childbirth: Secondary | ICD-10-CM | POA: Diagnosis present

## 2022-05-24 DIAGNOSIS — Z3A37 37 weeks gestation of pregnancy: Secondary | ICD-10-CM | POA: Diagnosis not present

## 2022-05-24 DIAGNOSIS — O99332 Smoking (tobacco) complicating pregnancy, second trimester: Secondary | ICD-10-CM

## 2022-05-24 DIAGNOSIS — O99210 Obesity complicating pregnancy, unspecified trimester: Secondary | ICD-10-CM | POA: Diagnosis present

## 2022-05-24 DIAGNOSIS — Z720 Tobacco use: Secondary | ICD-10-CM | POA: Diagnosis present

## 2022-05-24 DIAGNOSIS — O99214 Obesity complicating childbirth: Secondary | ICD-10-CM | POA: Diagnosis not present

## 2022-05-24 DIAGNOSIS — J45909 Unspecified asthma, uncomplicated: Secondary | ICD-10-CM | POA: Diagnosis not present

## 2022-05-24 DIAGNOSIS — O135 Gestational [pregnancy-induced] hypertension without significant proteinuria, complicating the puerperium: Secondary | ICD-10-CM | POA: Diagnosis not present

## 2022-05-24 DIAGNOSIS — O365930 Maternal care for other known or suspected poor fetal growth, third trimester, not applicable or unspecified: Secondary | ICD-10-CM | POA: Diagnosis not present

## 2022-05-24 DIAGNOSIS — O365939 Maternal care for other known or suspected poor fetal growth, third trimester, other fetus: Secondary | ICD-10-CM | POA: Diagnosis present

## 2022-05-24 DIAGNOSIS — O99344 Other mental disorders complicating childbirth: Secondary | ICD-10-CM | POA: Diagnosis not present

## 2022-05-24 DIAGNOSIS — O0990 Supervision of high risk pregnancy, unspecified, unspecified trimester: Secondary | ICD-10-CM

## 2022-05-24 DIAGNOSIS — O9952 Diseases of the respiratory system complicating childbirth: Secondary | ICD-10-CM | POA: Diagnosis not present

## 2022-05-24 DIAGNOSIS — O165 Unspecified maternal hypertension, complicating the puerperium: Secondary | ICD-10-CM

## 2022-05-24 LAB — TYPE AND SCREEN
ABO/RH(D): A POS
Antibody Screen: NEGATIVE

## 2022-05-24 LAB — CBC
HCT: 31.1 % — ABNORMAL LOW (ref 36.0–46.0)
Hemoglobin: 10.8 g/dL — ABNORMAL LOW (ref 12.0–15.0)
MCH: 32.4 pg (ref 26.0–34.0)
MCHC: 34.7 g/dL (ref 30.0–36.0)
MCV: 93.4 fL (ref 80.0–100.0)
Platelets: 384 10*3/uL (ref 150–400)
RBC: 3.33 MIL/uL — ABNORMAL LOW (ref 3.87–5.11)
RDW: 12.5 % (ref 11.5–15.5)
WBC: 16 10*3/uL — ABNORMAL HIGH (ref 4.0–10.5)
nRBC: 0 % (ref 0.0–0.2)

## 2022-05-24 LAB — COMPREHENSIVE METABOLIC PANEL
ALT: 10 U/L (ref 0–44)
AST: 25 U/L (ref 15–41)
Albumin: 2.2 g/dL — ABNORMAL LOW (ref 3.5–5.0)
Alkaline Phosphatase: 167 U/L — ABNORMAL HIGH (ref 38–126)
Anion gap: 11 (ref 5–15)
BUN: 15 mg/dL (ref 6–20)
CO2: 18 mmol/L — ABNORMAL LOW (ref 22–32)
Calcium: 8.9 mg/dL (ref 8.9–10.3)
Chloride: 103 mmol/L (ref 98–111)
Creatinine, Ser: 0.65 mg/dL (ref 0.44–1.00)
GFR, Estimated: >60 mL/min (ref >60)
Glucose, Bld: 105 mg/dL — ABNORMAL HIGH (ref 70–99)
Potassium: 3.9 mmol/L (ref 3.5–5.1)
Sodium: 132 mmol/L — ABNORMAL LOW (ref 135–145)
Total Bilirubin: 0.3 mg/dL (ref 0.3–1.2)
Total Protein: 5.8 g/dL — ABNORMAL LOW (ref 6.5–8.1)

## 2022-05-24 LAB — RPR: RPR Ser Ql: NONREACTIVE

## 2022-05-24 MED ORDER — FENTANYL-BUPIVACAINE-NACL 0.5-0.125-0.9 MG/250ML-% EP SOLN
EPIDURAL | Status: DC | PRN
  Administered 2022-05-24: 12 mL/h via EPIDURAL

## 2022-05-24 MED ORDER — ZOLPIDEM TARTRATE 5 MG PO TABS: 5.0000 mg | ORAL_TABLET | Freq: Every evening | ORAL | Status: DC | PRN

## 2022-05-24 MED ORDER — MISOPROSTOL 25 MCG QUARTER TABLET
25.0000 ug | ORAL_TABLET | Freq: Once | ORAL | Status: AC
  Administered 2022-05-24: 25 ug via VAGINAL
  Filled 2022-05-24: qty 1

## 2022-05-24 MED ORDER — FENTANYL-BUPIVACAINE-NACL 0.5-0.125-0.9 MG/250ML-% EP SOLN
12.0000 mL/h | EPIDURAL | Status: DC | PRN
  Administered 2022-05-25: 12 mL/h via EPIDURAL
  Filled 2022-05-24 (×2): qty 250

## 2022-05-24 MED ORDER — ACETAMINOPHEN 325 MG PO TABS
650.0000 mg | ORAL_TABLET | ORAL | Status: DC | PRN
  Administered 2022-05-24 – 2022-05-25 (×4): 650 mg via ORAL
  Filled 2022-05-24 (×4): qty 2

## 2022-05-24 MED ORDER — LIDOCAINE HCL (PF) 1 % IJ SOLN
INTRAMUSCULAR | Status: DC | PRN
  Administered 2022-05-24 (×2): 4 mL via EPIDURAL

## 2022-05-24 MED ORDER — OXYTOCIN-SODIUM CHLORIDE 30-0.9 UT/500ML-% IV SOLN
2.5000 [IU]/h | INTRAVENOUS | Status: DC
  Administered 2022-05-25: 2.5 [IU]/h via INTRAVENOUS

## 2022-05-24 MED ORDER — MISOPROSTOL 25 MCG QUARTER TABLET
25.0000 ug | ORAL_TABLET | Freq: Once | ORAL | Status: AC
  Administered 2022-05-24: 25 ug via ORAL
  Filled 2022-05-24: qty 1

## 2022-05-24 MED ORDER — LIDOCAINE HCL (PF) 1 % IJ SOLN: 30.0000 mL | INTRAMUSCULAR | Status: DC | PRN

## 2022-05-24 MED ORDER — TERBUTALINE SULFATE 1 MG/ML IJ SOLN: 0.2500 mg | Freq: Once | INTRAMUSCULAR | Status: DC | PRN

## 2022-05-24 MED ORDER — FENTANYL CITRATE (PF) 100 MCG/2ML IJ SOLN
50.0000 ug | INTRAMUSCULAR | Status: DC | PRN
  Administered 2022-05-24: 100 ug via INTRAVENOUS
  Filled 2022-05-24: qty 2

## 2022-05-24 MED ORDER — HYDROXYZINE HCL 50 MG PO TABS: 50.0000 mg | ORAL_TABLET | Freq: Four times a day (QID) | ORAL | Status: DC | PRN

## 2022-05-24 MED ORDER — OXYCODONE-ACETAMINOPHEN 5-325 MG PO TABS: 2.0000 | ORAL_TABLET | ORAL | Status: DC | PRN

## 2022-05-24 MED ORDER — DIPHENHYDRAMINE HCL 50 MG/ML IJ SOLN: 12.5000 mg | INTRAMUSCULAR | Status: DC | PRN

## 2022-05-24 MED ORDER — ONDANSETRON HCL 4 MG/2ML IJ SOLN
4.0000 mg | Freq: Four times a day (QID) | INTRAMUSCULAR | Status: DC | PRN
  Administered 2022-05-25 (×2): 4 mg via INTRAVENOUS
  Filled 2022-05-24 (×2): qty 2

## 2022-05-24 MED ORDER — OXYTOCIN-SODIUM CHLORIDE 30-0.9 UT/500ML-% IV SOLN
1.0000 m[IU]/min | INTRAVENOUS | Status: DC
  Administered 2022-05-24: 2 m[IU]/min via INTRAVENOUS
  Filled 2022-05-24: qty 500

## 2022-05-24 MED ORDER — LACTATED RINGERS IV SOLN
500.0000 mL | INTRAVENOUS | Status: DC | PRN
  Administered 2022-05-25: 500 mL via INTRAVENOUS

## 2022-05-24 MED ORDER — LACTATED RINGERS IV SOLN: INTRAVENOUS | Status: DC

## 2022-05-24 MED ORDER — EPHEDRINE 5 MG/ML INJ: 10.0000 mg | INTRAVENOUS | Status: DC | PRN

## 2022-05-24 MED ORDER — PHENYLEPHRINE 80 MCG/ML (10ML) SYRINGE FOR IV PUSH (FOR BLOOD PRESSURE SUPPORT): 80.0000 ug | PREFILLED_SYRINGE | INTRAVENOUS | Status: DC | PRN

## 2022-05-24 MED ORDER — FLEET ENEMA 7-19 GM/118ML RE ENEM: 1.0000 | ENEMA | Freq: Every day | RECTAL | Status: DC | PRN

## 2022-05-24 MED ORDER — OXYTOCIN BOLUS FROM INFUSION
333.0000 mL | Freq: Once | INTRAVENOUS | Status: AC
  Administered 2022-05-25: 333 mL via INTRAVENOUS

## 2022-05-24 MED ORDER — OXYCODONE-ACETAMINOPHEN 5-325 MG PO TABS: 1.0000 | ORAL_TABLET | ORAL | Status: DC | PRN

## 2022-05-24 MED ORDER — LACTATED RINGERS IV SOLN
500.0000 mL | Freq: Once | INTRAVENOUS | Status: AC
  Administered 2022-05-25: 500 mL via INTRAVENOUS

## 2022-05-24 MED ORDER — SOD CITRATE-CITRIC ACID 500-334 MG/5ML PO SOLN: 30.0000 mL | ORAL | Status: DC | PRN

## 2022-05-24 NOTE — Progress Notes (Signed)
Labor Progress Note April James is a 32 y.o. Z6X0960 at [redacted]w[redacted]d presented for IOL IUGR   S: Epidural placed. Now much more comfortable and resting in bed.    O:  BP 121/64   Pulse 76   Temp 98 F (36.7 C) (Oral)   Resp 18   Ht  (1.575 m)   Wt 104.4 kg   LMP 09/06/2021 (Exact Date)   SpO2 99%   BMI 42.10 kg/m  EFM: 135bpm/moderate variability/15x15 accels/none decels   CVE: Dilation: 3 Effacement (%): 60 Station: -2, -3 Presentation: Vertex Exam by:: Blima Singer RN  A&P: 32 y.o. A5W0981 [redacted]w[redacted]d here for IOL d/t IUGR #Labor: Has continued to progress after Cytotec, FB. On exam, has small amount of blood and unable to palpate bag - she is unsure if she has lost fluid. Begin to titrate pitocin per protocol.  #Pain: Epidural placed. Support person at bedside.  #FWB: Cat 1 #GBS negative   Rosario Adie, MS3 [facility]  05/24/22 10:47 PM

## 2022-05-24 NOTE — Anesthesia Preprocedure Evaluation (Signed)
Anesthesia Evaluation  Patient identified by MRN, date of birth, ID band Patient awake    Reviewed: Allergy & Precautions, Patient's Chart, lab work & pertinent test results  History of Anesthesia Complications Negative for: history of anesthetic complications  Airway Mallampati: II  TM Distance: >3 FB Neck ROM: Full    Dental no notable dental hx.    Pulmonary asthma , Current Smoker   Pulmonary exam normal        Cardiovascular negative cardio ROS Normal cardiovascular exam     Neuro/Psych    Depression Bipolar Disorder      GI/Hepatic negative GI ROS, Neg liver ROS,,,  Endo/Other    Morbid obesity  Renal/GU negative Renal ROS     Musculoskeletal negative musculoskeletal ROS (+)    Abdominal   Peds  Hematology negative hematology ROS (+)   Anesthesia Other Findings Day of surgery medications reviewed with patient.  Reproductive/Obstetrics (+) Pregnancy                              Anesthesia Physical Anesthesia Plan  ASA: 3  Anesthesia Plan: Epidural   Post-op Pain Management:    Induction:   PONV Risk Score and Plan: Treatment may vary due to age or medical condition  Airway Management Planned: Natural Airway  Additional Equipment: Fetal Monitoring  Intra-op Plan:   Post-operative Plan:   Informed Consent: I have reviewed the patients History and Physical, chart, labs and discussed the procedure including the risks, benefits and alternatives for the proposed anesthesia with the patient or authorized representative who has indicated his/her understanding and acceptance.       Plan Discussed with:   Anesthesia Plan Comments:          Anesthesia Quick Evaluation

## 2022-05-24 NOTE — Progress Notes (Signed)
Labor Progress Note  April James is a 32 y.o. Z6X0960 at [redacted]w[redacted]d presented for IOL IUGR  S: Pt doing well. Ready for FB placement  O:  BP 123/74   Pulse 75   Temp 98 F (36.7 C) (Oral)   Resp 16   LMP 09/06/2021 (Exact Date)  EFM: 135 bpm/Moderate variability/ 15x15 accels/ None decels  CVE: Dilation: 2 Effacement (%): 50 Station: -3 Presentation: Vertex Exam by:: Mercado-Ortiz, DO   A&P: 32 y.o. A5W0981 [redacted]w[redacted]d  here for IOL as above  #Labor: Progressing well. S/p FB, Cytotec Oral #Pain: Family/Friend support and PO/IV pain meds #FWB: CAT 1 #GBS negative  Myrtie Hawk, DO FMOB Fellow, Faculty practice [facility], Center for [facility] 05/24/22  2:20 PM

## 2022-05-24 NOTE — Anesthesia Procedure Notes (Signed)
Epidural Patient location during procedure: OB Start time: 05/24/2022 6:55 PM End time: 05/24/2022 6:58 PM  Staffing Anesthesiologist: Kaylyn Layer, MD Performed: anesthesiologist   Preanesthetic Checklist Completed: patient identified, IV checked, risks and benefits discussed, monitors and equipment checked, pre-op evaluation and timeout performed  Epidural Patient position: sitting Prep: DuraPrep and site prepped and draped Patient monitoring: continuous pulse ox, blood pressure and heart rate Approach: midline Location: L3-L4 Injection technique: LOR air  Needle:  Needle type: Tuohy  Needle gauge: 17 G Needle length: 9 cm Needle insertion depth: 6 cm Catheter type: closed end flexible Catheter size: 19 Gauge Catheter at skin depth: 11 cm Test dose: negative and Other (1% lidocaine)  Assessment Events: blood not aspirated, no cerebrospinal fluid, injection not painful, no injection resistance, no paresthesia and negative IV test  Additional Notes Patient identified. Risks, benefits, and alternatives discussed with patient including but not limited to bleeding, infection, nerve damage, paralysis, failed block, incomplete pain control, headache, blood pressure changes, nausea, vomiting, reactions to medication, itching, and postpartum back pain. Confirmed with bedside nurse the patient's most recent platelet count. Confirmed with patient that they are not currently taking any anticoagulation, have any bleeding history, or any family history of bleeding disorders. Patient expressed understanding and wished to proceed. All questions were answered. Sterile technique was used throughout the entire procedure. Please see nursing notes for vital signs.   Crisp LOR on first pass. Test dose was given through epidural catheter and negative prior to continuing to dose epidural or start infusion. Warning signs of high block given to the patient including shortness of breath,  tingling/numbness in hands, complete motor block, or any concerning symptoms with instructions to call for help. Patient was given instructions on fall risk and not to get out of bed. All questions and concerns addressed with instructions to call with any issues or inadequate analgesia.  Reason for block:procedure for pain

## 2022-05-24 NOTE — H&P (Addendum)
OBSTETRIC ADMISSION HISTORY AND PHYSICAL  April James is a 32 y.o. female [phone] with IUP at [redacted]w[redacted]d by LMP presenting for IOL d/t IUGR. She reports +FMs, No LOF, no VB, no blurry vision, headaches or peripheral edema, and RUQ pain.  She plans on bottle feeding. She request depo provera for birth control. She received her prenatal care at  [facility]    Dating: By LMP --->  Estimated Date of Delivery: 06/13/22  Sono:   @[redacted]w[redacted]d , CWD, normal anatomy, breech presentation, 535g, 21% EFW @[redacted]w[redacted]d , cephalic presentation, 1647g, 4.7% EFW  Prenatal History/Complications:  Smoker: 2ppd at beginning of pregnancy, cut to 0.5-1ppd in early second trimester IUGR in third trimester, followed by MFM Chlamydia infection in second trimester Bipolar disease: PRN Vistaril throughout pregnancy  Obesity in pregnancy   Nursing Staff Provider  Office Location [place] Dating  06/13/2022 LMP c/w 12 week [place]  [facility] Model [ x] Traditional [ ]  Centering [ ]  Mom-Baby Dyad Anatomy US     Language  English      Flu Vaccine  Declined Genetic/Carrier Screen  NIPS:   low risk female AFP:   Neg Horizon:  TDaP Vaccine   04/05/22 Hgb A1C or  GTT Early  Third trimester wnl  COVID Vaccine     LAB RESULTS   Rhogam  A/Positive/-- (12/14 1024)  Blood Type A/Positive/-- (12/14 1024)   Baby Feeding Plan Bottle Antibody Negative (12/14 1024)  Contraception Depo Provera Rubella 2.97 (12/14 1024)  Circumcision Yes RPR Non Reactive (12/14 1024)   Pediatrician  Dr. Vedia Coffer - [facility] HBsAg Negative (12/14 1024)   Support Person FOB - Tasia Catchings HCVAb Non Reactive (12/14 1024)   Prenatal Classes No HIV Non Reactive (12/14 1024)         GBS  Negative      Pap       Diagnosis  Date Value Ref Range Status  01/13/2022     Final    - Negative for Intraepithelial Lesions or Malignancy (NILM)  01/13/2022 - Benign reactive/reparative changes   Final             DME Rx [ ]  BP cuff [ ]  Weight Scale      PHQ9 & GAD7 [ x ] new OB [  ]  28 weeks  [  ] 36 weeks Induction  [ ]  Orders Entered [ ] Foley Y/N     Past Medical History: Past Medical History:  Diagnosis Date   Asthma    Bipolar 1 disorder    Bipolar affective disorder, currently depressed, moderate    Bipolar disorder with current episode depressed 05/03/2015   Chlamydia infection affecting pregnancy in second trimester 01/19/2022   Toc neg early feb 2024    Past Surgical History: Past Surgical History:  Procedure Laterality Date   CHOLECYSTECTOMY     TONSILLECTOMY      Obstetrical History: OB History     Gravida  4   Para  1   Term  1   Preterm  0   AB  2   Living  1      SAB  1   IAB  1   Ectopic  0   Multiple  0   Live Births  1           Social History Social History   Socioeconomic History   Marital status: Significant Other    Spouse name: Not on file   Number of children: Not on file  Years of education: Not on file   Highest education level: Not on file  Occupational History   Not on file  Tobacco Use   Smoking status: Every Day    Packs/day: 0.20    Years: 16.00    Additional pack years: 0.00    Total pack years: 3.20    Types: Cigarettes    Passive exposure: Current   Smokeless tobacco: Never  Vaping Use   Vaping Use: Never used  Substance and Sexual Activity   Alcohol use: Not Currently    Comment: stopped for now   Drug use: Yes    Types: Marijuana    Comment: "Not very often". Last used: 2 months ago.    Sexual activity: Yes    Birth control/protection: None  Other Topics Concern   Not on file  Social History Narrative   Not on file   Social Determinants of Health   Financial Resource Strain: Not on file  Food Insecurity: Not on file  Transportation Needs: Not on file  Physical Activity: Not on file  Stress: Not on file  Social Connections: Not on file    Family History: Family History  Problem Relation Age of Onset   Bipolar disorder Father    Heart disease Father     Aneurysm Father    Asthma Son    Diabetes Maternal Grandfather    Diabetes Paternal Grandmother    Cancer Paternal Grandmother    Hypertension Paternal Grandfather    Stroke Neg Hx     Allergies: Allergies  Allergen Reactions   Penicillins Hives and Shortness Of Breath    Has patient had a PCN reaction causing immediate rash, facial/tongue/throat swelling, SOB or lightheadedness with hypotension: No Has patient had a PCN reaction causing severe rash involving mucus membranes or skin necrosis: no Has patient had a PCN reaction that required hospitalization: no Has patient had a PCN reaction occurring within the last 10 years: no If all of the above answers are "NO", then may proceed with Cephalosporin use.    Sulfa Antibiotics Rash    Medications Prior to Admission  Medication Sig Dispense Refill Last Dose   aspirin EC 81 MG tablet Take 1 tablet (81 mg total) by mouth daily. Swallow whole. (Patient not taking: Reported on 04/19/2022) 30 tablet 12    hydrOXYzine (VISTARIL) 25 MG capsule Take 1 capsule (25 mg total) by mouth 3 (three) times daily as needed. 90 capsule 11    nicotine (NICODERM CQ - DOSED IN MG/24 HOURS) 14 mg/24hr patch Place 1 patch (14 mg total) onto the skin daily. 28 patch 0      Review of Systems   All systems reviewed and negative except as stated in HPI  Last menstrual period 09/06/2021. General appearance: alert, cooperative, and appears stated age Lungs: clear to auscultation bilaterally Heart: regular rate and rhythm Abdomen: soft, non-tender; bowel sounds normal Pelvic: see below Extremities: Homans sign is negative, no sign of DVT Presentation: cephalic Fetal monitoring: Baseline: 135 bpm, Variability: Good {> 6 bpm), Accelerations: 15x15, and Decelerations: Absent Uterine activity: None    Prenatal labs: ABO, Rh: A/Positive/-- (12/14 1024) Antibody: Negative (12/14 1024) Rubella: 2.97 (12/14 1024) RPR: Non Reactive (02/06 0848)  HBsAg:  Negative (12/14 1024)  HIV: Non Reactive (02/06 0848)  GBS: Negative/-- (04/16 1640)  2 hr GTT: nl Genetic screening:  LR NIPS, neg AFP, neg Horizon Anatomy US: normal  Prenatal Transfer Tool  Maternal Diabetes: No Genetic Screening: Normal Maternal Ultrasounds/Referrals: IUGR Fetal  Ultrasounds or other Referrals:  Referred to Materal Fetal Medicine , UA doppler neg  Maternal Substance Abuse:  Yes:  Type: Smoker Significant Maternal Medications:  None Significant Maternal Lab Results:  Group B Strep negative, Rubella non-immune Number of Prenatal Visits:greater than 3 verified prenatal visits Other Comments:  None  No results found for this or any previous visit (from the past 24 hour(s)).  Patient Active Problem List   Diagnosis Date Noted   IUGR (intrauterine growth restriction) affecting care of mother, third trimester, other fetus 05/24/2022   IUGR (intrauterine growth restriction) affecting care of mother, third trimester 04/05/2022   Obesity in pregnancy, antepartum 03/08/2022   Bipolar disease in pregnancy in third trimester 03/08/2022   BMI 36.0-36.9,adult 03/08/2022   Supervision of high risk pregnancy, antepartum 01/13/2022   Tobacco use affecting pregnancy, antepartum 01/13/2022   Alcohol use disorder, mild, abuse 05/03/2015    Assessment/Plan:  April Dunker is a 32 y.o. G4P1021 at [redacted]w[redacted]d here for IOL d/t IUGR (2.5% EFW , doppler neg).   #Labor: Plan for IOL with Cytotec. Attempted FB placement but was unable to pass inner os. Will try again after cytotec.   #Pain: PRNs for now, epidural available on request. Discussed this with patient.  #FWB: Cat 1 #ID:  GBS negative, Rubella non-immune #MOF: bottle #MOC:Depo provera  #Circ:  Yes  Rosario Adie, MS3 [facility]  05/24/22 1:45 AM   Fellow Attestation  I saw and evaluated the patient, performing the key elements of the service.I  personally performed or re-performed the history,  physical exam, and medical decision making activities of this service and have verified that the service and findings are accurately documented in the student's note. I developed the management plan that is described in the medical student's note, and I agree with the content, with my edits above.    Derrel Nip, MD

## 2022-05-24 NOTE — Progress Notes (Signed)
Labor Progress Note  April James is a 32 y.o. W1X9147 at [redacted]w[redacted]d presented for IOL IUGR  S: Pt doing well. Would like to wait until 4hr from last SVE for FB placement  O:  BP 130/83   Pulse 77   Temp 97.6 F (36.4 C) (Oral)   Resp 18   LMP 09/06/2021 (Exact Date)  EFM: 130 bpm/Moderate variability/ 15x15 accels/ None decels  CVE: Dilation: 1 Effacement (%): 60 Station: -2 Presentation: Vertex Exam by:: Kathi Simpers, RN   A&P: 32 y.o. W2N5621 [redacted]w[redacted]d  here for IOL as above  #Labor: Progressing well. S/p cytotec 0151, awaiting FB until ~0930 #Pain: Family/Friend support and PO/IV pain meds #FWB: CAT 1 #GBS negative  Myrtie Hawk, DO FMOB Fellow, Faculty practice [facility], Center for [facility] Healthcare 05/24/22  9:20 AM

## 2022-05-25 ENCOUNTER — Encounter (HOSPITAL_COMMUNITY): Payer: Self-pay | Admitting: Obstetrics & Gynecology

## 2022-05-25 DIAGNOSIS — O99334 Smoking (tobacco) complicating childbirth: Secondary | ICD-10-CM

## 2022-05-25 DIAGNOSIS — O99344 Other mental disorders complicating childbirth: Secondary | ICD-10-CM

## 2022-05-25 DIAGNOSIS — O99214 Obesity complicating childbirth: Secondary | ICD-10-CM

## 2022-05-25 DIAGNOSIS — Z3A37 37 weeks gestation of pregnancy: Secondary | ICD-10-CM

## 2022-05-25 DIAGNOSIS — O365930 Maternal care for other known or suspected poor fetal growth, third trimester, not applicable or unspecified: Secondary | ICD-10-CM

## 2022-05-25 DIAGNOSIS — O135 Gestational [pregnancy-induced] hypertension without significant proteinuria, complicating the puerperium: Secondary | ICD-10-CM

## 2022-05-25 MED ORDER — TETANUS-DIPHTH-ACELL PERTUSSIS 5-2.5-18.5 LF-MCG/0.5 IM SUSY: 0.5000 mL | PREFILLED_SYRINGE | Freq: Once | INTRAMUSCULAR | Status: DC

## 2022-05-25 MED ORDER — SENNOSIDES-DOCUSATE SODIUM 8.6-50 MG PO TABS
2.0000 | ORAL_TABLET | ORAL | Status: DC
  Administered 2022-05-27: 2 via ORAL
  Filled 2022-05-25 (×2): qty 2

## 2022-05-25 MED ORDER — CYCLOBENZAPRINE HCL 5 MG PO TABS
5.0000 mg | ORAL_TABLET | Freq: Three times a day (TID) | ORAL | Status: DC | PRN
  Administered 2022-05-25: 5 mg via ORAL
  Filled 2022-05-25: qty 1

## 2022-05-25 MED ORDER — ONDANSETRON HCL 4 MG PO TABS: 4.0000 mg | ORAL_TABLET | ORAL | Status: DC | PRN

## 2022-05-25 MED ORDER — MEASLES, MUMPS & RUBELLA VAC IJ SOLR: 0.5000 mL | Freq: Once | INTRAMUSCULAR | Status: DC

## 2022-05-25 MED ORDER — DIBUCAINE (PERIANAL) 1 % EX OINT: 1.0000 | TOPICAL_OINTMENT | CUTANEOUS | Status: DC | PRN

## 2022-05-25 MED ORDER — IBUPROFEN 600 MG PO TABS
600.0000 mg | ORAL_TABLET | Freq: Four times a day (QID) | ORAL | Status: DC
  Administered 2022-05-25 – 2022-05-27 (×8): 600 mg via ORAL
  Filled 2022-05-25 (×8): qty 1

## 2022-05-25 MED ORDER — COCONUT OIL OIL: 1.0000 | TOPICAL_OIL | Status: DC | PRN

## 2022-05-25 MED ORDER — DIPHENHYDRAMINE HCL 25 MG PO CAPS: 25.0000 mg | ORAL_CAPSULE | Freq: Four times a day (QID) | ORAL | Status: DC | PRN

## 2022-05-25 MED ORDER — ZOLPIDEM TARTRATE 5 MG PO TABS: 5.0000 mg | ORAL_TABLET | Freq: Every evening | ORAL | Status: DC | PRN

## 2022-05-25 MED ORDER — WITCH HAZEL-GLYCERIN EX PADS: 1.0000 | MEDICATED_PAD | CUTANEOUS | Status: DC | PRN

## 2022-05-25 MED ORDER — SIMETHICONE 80 MG PO CHEW: 80.0000 mg | CHEWABLE_TABLET | ORAL | Status: DC | PRN

## 2022-05-25 MED ORDER — BENZOCAINE-MENTHOL 20-0.5 % EX AERO
1.0000 | INHALATION_SPRAY | CUTANEOUS | Status: DC | PRN
  Administered 2022-05-26: 1 via TOPICAL
  Filled 2022-05-25: qty 56

## 2022-05-25 MED ORDER — SODIUM CHLORIDE 0.9% FLUSH: 3.0000 mL | Freq: Two times a day (BID) | INTRAVENOUS | Status: DC

## 2022-05-25 MED ORDER — PRENATAL MULTIVITAMIN CH
1.0000 | ORAL_TABLET | Freq: Every day | ORAL | Status: DC
  Administered 2022-05-26 – 2022-05-27 (×2): 1 via ORAL
  Filled 2022-05-25 (×2): qty 1

## 2022-05-25 MED ORDER — ACETAMINOPHEN 325 MG PO TABS
650.0000 mg | ORAL_TABLET | ORAL | Status: DC | PRN
  Administered 2022-05-25 – 2022-05-27 (×7): 650 mg via ORAL
  Filled 2022-05-25 (×7): qty 2

## 2022-05-25 MED ORDER — ONDANSETRON HCL 4 MG/2ML IJ SOLN: 4.0000 mg | INTRAMUSCULAR | Status: DC | PRN

## 2022-05-25 MED ORDER — SODIUM CHLORIDE 0.9% FLUSH: 3.0000 mL | INTRAVENOUS | Status: DC | PRN

## 2022-05-25 MED ORDER — SODIUM CHLORIDE 0.9 % IV SOLN: 250.0000 mL | INTRAVENOUS | Status: DC | PRN

## 2022-05-25 NOTE — Discharge Summary (Signed)
Postpartum Discharge Summary      Patient Name: April James DOB: 1991/01/12 MRN: [id number]  Date of admission: 05/24/2022 Delivery date:05/25/2022  Delivering provider: Myrtie James  Date of discharge: 05/27/2022  Admitting diagnosis: IUGR (intrauterine growth restriction) affecting care of mother, third trimester, other fetus [O36.5939] Intrauterine pregnancy: [redacted]w[redacted]d     Secondary diagnosis:  Principal Problem:   Vaginal delivery Active Problems:   Supervision of high risk pregnancy, antepartum   Tobacco use affecting pregnancy, antepartum   Obesity in pregnancy, antepartum   Bipolar disease in pregnancy in third trimester (HCC)   IUGR (intrauterine growth restriction) affecting care of mother, third trimester   IUGR (intrauterine growth restriction) affecting care of mother, third trimester, other fetus   Gestational hypertension  Additional problems: None    Discharge diagnosis: Term Pregnancy Delivered                                              Post partum procedures: None Augmentation: AROM, Pitocin, Cytotec, and IP Foley Complications: None  Hospital course: Induction of Labor With Vaginal Delivery   32 y.o. yo [id number] at [redacted]w[redacted]d was admitted to the hospital 05/24/2022 for induction of labor.  Indication for induction:  IUGR .  Patient had an labor course complicated by none Membrane Rupture Time/Date: 5:39 AM ,05/25/2022   Delivery Method:Vaginal, Spontaneous  Episiotomy: None  Lacerations:  None  Details of delivery can be found in separate delivery note.  Patient had a postpartum course complicated byelevated BPS, started on procardia and lasix . Patient is discharged home 05/27/22.  Newborn Data: Birth date:05/25/2022  Birth time:3:39 PM  Gender:Female  Living status:Living  Apgars:8 ,9  Weight:2230 g   Magnesium Sulfate received: No BMZ received: No Rhophylac:N/A MMR:N/A T-DaP:Given prenatally Flu: No Transfusion:No  Physical exam  Vitals:    05/26/22 0841 05/26/22 1354 05/26/22 2111 05/27/22 0550  BP: 135/86 130/68 110/60 123/78  Pulse: 68 71 70 75  Resp:  18 19 16   Temp:  97.8 F (36.6 C) 98 F (36.7 C) 98.4 F (36.9 C)  TempSrc:  Oral Oral Oral  SpO2:  98% 99% 100%  Weight:      Height:       General: alert, cooperative, and no distress Lochia: appropriate Uterine Fundus: firm Incision: N/A DVT Evaluation: No evidence of DVT seen on physical exam. Labs: Lab Results  Component Value Date   WBC 16.3 (H) 05/26/2022   HGB 9.5 (L) 05/26/2022   HCT 26.3 (L) 05/26/2022   MCV 90.7 05/26/2022   PLT 317 05/26/2022      Latest Ref Rng & Units 05/24/2022    1:44 AM  CMP  Glucose 70 - 99 mg/dL 147   BUN 6 - 20 mg/dL 15   Creatinine 8.29 - 1.00 mg/dL 5.62   Sodium 130 - 865 mmol/L 132   Potassium 3.5 - 5.1 mmol/L 3.9   Chloride 98 - 111 mmol/L 103   CO2 22 - 32 mmol/L 18   Calcium 8.9 - 10.3 mg/dL 8.9   Total Protein 6.5 - 8.1 g/dL 5.8   Total Bilirubin 0.3 - 1.2 mg/dL 0.3   Alkaline Phos 38 - 126 U/L 167   AST 15 - 41 U/L 25   ALT 0 - 44 U/L 10    Edinburgh Score:    05/25/2022    6:15 PM  April James  Postnatal Depression Scale Screening Tool  I have been able to laugh and see the funny side of things. 0  I have looked forward with enjoyment to things. 0  I have blamed myself unnecessarily when things went wrong. 1  I have been anxious or worried for no good reason. 0  I have felt scared or panicky for no good reason. 0  Things have been getting on top of me. 1  I have been so unhappy that I have had difficulty sleeping. 0  I have felt sad or miserable. 1  I have been so unhappy that I have been crying. 1  The thought of harming myself has occurred to me. 0  Edinburgh Postnatal Depression Scale Total 4     After visit meds:  Allergies as of 05/27/2022       Reactions   Penicillins Hives, Shortness Of Breath   Sulfa Antibiotics Rash        Medication List     STOP taking these medications     aspirin EC 81 MG tablet       TAKE these medications    acetaminophen 325 MG tablet Commonly known as: Tylenol Take 2 tablets (650 mg total) by mouth every 4 (four) hours as needed (for pain scale < 4).   furosemide 20 MG tablet Commonly known as: LASIX Take 1 tablet (20 mg total) by mouth daily.   hydrOXYzine 25 MG capsule Commonly known as: Vistaril Take 1 capsule (25 mg total) by mouth 3 (three) times daily as needed. What changed: reasons to take this   ibuprofen 600 MG tablet Commonly known as: ADVIL Take 1 tablet (600 mg total) by mouth every 6 (six) hours.   nicotine 14 mg/24hr patch Commonly known as: NICODERM CQ - dosed in mg/24 hours Place 1 patch (14 mg total) onto the skin daily.   NIFEdipine 30 MG 24 hr tablet Commonly known as: ADALAT CC Take 1 tablet (30 mg total) by mouth daily.         Discharge home in stable condition Infant Feeding: Bottle and Breast Infant Disposition:home with mother Discharge instruction: per After Visit Summary and Postpartum booklet. Activity: Advance as tolerated. Pelvic rest for 6 weeks.  Diet: routine diet Future Appointments: Future Appointments  Date Time Provider Department Center  06/01/2022  3:50 PM [facility] NURSE [facility]  07/05/2022  3:10 PM Anyanwu, Jethro Bastos, MD [facility]   Follow up Visit: Message sent to [facility] 4/24  Please schedule this patient for a In person postpartum visit in 6 weeks with the following provider: Any provider. Additional Postpartum F/U:Postpartum Depression checkup  High risk pregnancy complicated by:  IUGR, obesity, tobacco use Delivery mode:  Vaginal, Spontaneous  Anticipated Birth Control:  Depo   05/27/2022 April Savage, MD

## 2022-05-25 NOTE — Progress Notes (Signed)
Labor Progress Note  April James is a 32 y.o. K4M0102 at [redacted]w[redacted]d presented for IOL IUGR  S: Pt has epidural and doing well.   O:  BP 114/63 (BP Location: Left Arm)   Pulse 68   Temp (!) 97.5 F (36.4 C) (Oral)   Resp 20   Ht  (1.575 m)   Wt 104.4 kg   LMP 09/06/2021 (Exact Date)   SpO2 99%   BMI 42.10 kg/m  EFM: 130 bpm/Moderate variability/ 15x15 accels/ None decels Toco: q 5-7 min   CVE: Dilation: 5 Effacement (%): 60 Station: -2, -1 Presentation: Vertex Exam by:: Lanae Crumbly DO   A&P: 32 y.o. V2Z3664 [redacted]w[redacted]d  here for IOL as above  #Labor: Progressing well. S/p AROM MSF. IUPC adjusted, FSE in place. Pitocin restarted at 5mu/min and increase by 1.  #Pain: Family/Friend support and Epidural #FWB: CAT 1 #GBS negative   Myrtie Hawk, DO FMOB Fellow, Faculty practice [facility], Center for [facility] Healthcare 05/25/22  9:12 AM

## 2022-05-25 NOTE — Progress Notes (Signed)
Labor Progress Note April James is a 32 y.o. [id number] at [redacted]w[redacted]d presented for IOL IUGR   S: doing well. No concerns. FHR noted to have recurrent decels about 2 hours ago. Improved following bolus IVF, position changes and cutting back pitocin to 6 units.  O:  BP (!) 116/55   Pulse 63   Temp 97.9 F (36.6 C) (Oral)   Resp 16   Ht  (1.575 m)   Wt 104.4 kg   LMP 09/06/2021 (Exact Date)   SpO2 99%   BMI 42.10 kg/m   EFM: 135 bpm, moderate variability, + accelerations, no decelerations  CVE: Dilation: 4 Effacement (%): 70 Station: -2, -1 Presentation: Vertex Exam by:: Amy peterman RN  A&P: 32 y.o. [id number] [redacted]w[redacted]d here for IOL d/t IUGR #Labor: s/p Cytotec, FB. Now on pitocin. Will continue to titrate per protocol.  #Pain: Epidural placed. Support person at bedside.  #FWB: Cat 1 #GBS negative   Carolanne Mercier MD MPH OB Fellow, Faculty Practice [facility], Center for [facility] 05/25/2022

## 2022-05-25 NOTE — Progress Notes (Signed)
Labor Progress Note  April James is a 32 y.o. [id number] at [redacted]w[redacted]d presented for IOL IUGR  S: Patient doing well although variable decels which responded to position changes.   O:  BP 132/72 (BP Location: Left Arm)   Pulse 69   Temp (!) 97.5 F (36.4 C) (Oral)   Resp 20   Ht  (1.575 m)   Wt 104.4 kg   LMP 09/06/2021 (Exact Date)   SpO2 99%   BMI 42.10 kg/m  EFM: 125bpm/Moderate variability/ 15x15 accels/ Variable decels  CVE: Dilation: 9 Effacement (%): 90 (thick anterior lip, tight band of cervix on patient's right side) Station: Plus 1 Presentation: Vertex Exam by:: R Kappert RN   A&P: 32 y.o. [id number] [redacted]w[redacted]d  here for IOL IUGR as above  #Labor: Continue pit at current rate and trying position changes. Good variability and recovery in FHT. Will continue to monitor closely  #Pain: Family/Friend support and Epidural #FWB: CAT 2 #GBS negative #IUGR with MSF during AROM: NICU to be at bedside during delivery   Gilles Chiquito PGY-3 05/25/22  1:05 PM

## 2022-05-25 NOTE — Progress Notes (Signed)
Labor Progress Note [name] is a 32 y.o. [id number] at [redacted]w[redacted]d presented for IOL IUGR   S: called to bedside by RN due to recurrent late decels. Evaluated patient. On pitocin at 10 units  O:  BP 127/77   Pulse 67   Temp 97.9 F (36.6 C) (Oral)   Resp 16   Ht  (1.575 m)   Wt 104.4 kg   LMP 09/06/2021 (Exact Date)   SpO2 99%   BMI 42.10 kg/m   EFM: 145bpm, moderate variability, no accels, recurrent variable and late decels.  Contractions: 200 - 220 MVU following IUPC  CVE: Dilation: 5 Effacement (%): 70, 80 Station: -3 Presentation: Vertex Exam by:: Kathi Simpers, RN  A&P: 32 y.o. [name] [redacted]w[redacted]d here for IOL d/t IUGR #Labor: s/p Cytotec, FB.on pitocin at 10 units; has had to cut back on pitocin once due to recurrent decels.  AROM performed - thick meconium stained fluid noted, with some vaginal bleeding with altered blood.  Applied IUPC, blood noted in tubing, RN unable to flush initially, but with some manipulation able to flush and amnioinfusion started..  Pitocin cut back to 4 units, with some improvement in FHR.  Discussed with Dr Charlotta Newton - recommended FSE - applied.   Discussed concerns for FHR, meconium stained fluid and bleeding. Will monitor for now.  #Pain: Epidural placed. Support person at bedside.  #FWB: Cat 2 due to recurrent variable and late decels. #GBS negative   Sheppard Evens MD MPH OB Fellow, Faculty Practice [facility], Center for [facility] 05/25/2022

## 2022-05-26 LAB — CBC
HCT: 26.3 % — ABNORMAL LOW (ref 36.0–46.0)
Hemoglobin: 9.5 g/dL — ABNORMAL LOW (ref 12.0–15.0)
MCH: 32.8 pg (ref 26.0–34.0)
MCHC: 36.1 g/dL — ABNORMAL HIGH (ref 30.0–36.0)
MCV: 90.7 fL (ref 80.0–100.0)
Platelets: 317 10*3/uL (ref 150–400)
RBC: 2.9 MIL/uL — ABNORMAL LOW (ref 3.87–5.11)
RDW: 12.4 % (ref 11.5–15.5)
WBC: 16.3 10*3/uL — ABNORMAL HIGH (ref 4.0–10.5)
nRBC: 0 % (ref 0.0–0.2)

## 2022-05-26 MED ORDER — PNEUMOCOCCAL 20-VAL CONJ VACC 0.5 ML IM SUSY
0.5000 mL | PREFILLED_SYRINGE | INTRAMUSCULAR | Status: DC
  Filled 2022-05-26: qty 0.5

## 2022-05-26 MED ORDER — NIFEDIPINE ER OSMOTIC RELEASE 30 MG PO TB24
30.0000 mg | ORAL_TABLET | Freq: Every day | ORAL | Status: DC
  Administered 2022-05-26 – 2022-05-27 (×2): 30 mg via ORAL
  Filled 2022-05-26 (×2): qty 1

## 2022-05-26 MED ORDER — FUROSEMIDE 20 MG PO TABS
20.0000 mg | ORAL_TABLET | Freq: Every day | ORAL | Status: DC
  Administered 2022-05-26 – 2022-05-27 (×2): 20 mg via ORAL
  Filled 2022-05-26 (×2): qty 1

## 2022-05-26 NOTE — Progress Notes (Signed)
B/P 145/78 at 0727 and 135/86 at 0840. Patient asymptomatic. Notified Dr. Torrie Mayers. Orders written
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
## 2022-05-26 NOTE — Lactation Note (Signed)
This note was copied from a baby's chart. Lactation Consultation Note  Patient Name: April James Date: 05/26/2022 Age:31 hours Birth Parent is formula feeding only.     Maternal Data    Feeding Nipple Type: Extra Slow Flow  LATCH Score                    Lactation Tools Discussed/Used    Interventions    Discharge    Consult Status Consult Status: Complete    Frederico Hamman 05/26/2022, 4:05 PM

## 2022-05-26 NOTE — Anesthesia Postprocedure Evaluation (Signed)
Anesthesia Post Note  Patient: April James  Procedure(s) Performed: AN AD HOC LABOR EPIDURAL     Patient location during evaluation: Mother Baby Anesthesia Type: Epidural Level of consciousness: awake, oriented and awake and alert Pain management: pain level controlled Vital Signs Assessment: post-procedure vital signs reviewed and stable Respiratory status: spontaneous breathing, respiratory function stable and nonlabored ventilation Cardiovascular status: stable Postop Assessment: no headache, adequate PO intake, able to ambulate, patient able to bend at knees and no apparent nausea or vomiting Anesthetic complications: no   No notable events documented.  Last Vitals:  Vitals:   05/26/22 0727 05/26/22 0841  BP: (!) 145/78 135/86  Pulse: 65 68  Resp: 20   Temp: 36.5 C   SpO2: 99%     Last Pain:  Vitals:   05/26/22 1246  TempSrc:   PainSc: 7    Pain Goal:                   Velva Molinari

## 2022-05-26 NOTE — Progress Notes (Signed)
Circumcision Consent   -Circumcision procedure details discussed including usage of local anesthesia, infant soother, and removal and disposal of foreskin. -Risks and benefits of procedure were reviewed including, but not limited to:  *Benefits include reduction in the rates of urinary tract infection (UTI), penile cancer, some sexually transmitted infections, penile inflammatory, and retractile disorders, as well as easier hygiene.   *Risks include bleeding, infection, injury of glans which may lead to need for additional surgery, penile deformity, or urinary tract issues, unsatisfactory cosmetic appearance and other potential complications related to the procedure.   -Informed that procedure will not be performed if provider deems inappropriate d/t penile size, noted deformity, or unsatisfactory pediatric evaluation. -It was emphasized that this is an elective procedure.   -Post circumcision care discussed.   Patient wants to proceed with circumcision. Informed that team would be notified and complete prior to discharge and upon satisfactory pediatric evaluation of infant.    Myrtie Hawk, DO FMOB Fellow, Faculty practice [facility], Center for [facility] 2:31 PM

## 2022-05-26 NOTE — Progress Notes (Signed)
POSTPARTUM PROGRESS NOTE  Post Partum Day 1  Subjective:  April James is a 33 y.o. [id number] s/p VD at [redacted]w[redacted]d.  She reports she is doing well. No acute events overnight. She denies any problems with ambulating, voiding or po intake. Denies nausea or vomiting.  Pain is well controlled.  Lochia is adequate.  Objective: Blood pressure 130/68, pulse 71, temperature 97.8 F (36.6 C), temperature source Oral, resp. rate 18, height  (1.575 m), weight 104.4 kg, last menstrual period 09/06/2021, SpO2 98 %, unknown if currently breastfeeding.  Physical Exam:  General: alert, cooperative and no distress Chest: no respiratory distress Heart:regular rate, distal pulses intact Uterine Fundus: firm, appropriately tender DVT Evaluation: No calf swelling or tenderness Extremities: no edema Skin: warm, dry  Recent Labs    05/24/22 0144 05/26/22 0611  HGB 10.8* 9.5*  HCT 31.1* 26.3*    Assessment/Plan: April April James is a 32 y.o. [id number] s/p VD at [redacted]w[redacted]d   PPD#1 - Doing well  Routine postpartum care Contraception: depo Feeding: breast/bottle Dispo: Plan for discharge tomorrow.   LOS: 2 days   Myrtie Hawk, DO OB Fellow  05/26/2022, 2:31 PM

## 2022-05-27 ENCOUNTER — Other Ambulatory Visit (HOSPITAL_COMMUNITY): Payer: Self-pay

## 2022-05-27 DIAGNOSIS — O139 Gestational [pregnancy-induced] hypertension without significant proteinuria, unspecified trimester: Secondary | ICD-10-CM | POA: Insufficient documentation

## 2022-05-27 MED ORDER — IBUPROFEN 600 MG PO TABS
600.0000 mg | ORAL_TABLET | Freq: Four times a day (QID) | ORAL | 0 refills | Status: DC
  Filled 2022-05-27: qty 30, 8d supply, fill #0

## 2022-05-27 MED ORDER — FUROSEMIDE 20 MG PO TABS
20.0000 mg | ORAL_TABLET | Freq: Every day | ORAL | 0 refills | Status: DC
  Filled 2022-05-27: qty 3, 3d supply, fill #0

## 2022-05-27 MED ORDER — NICOTINE 14 MG/24HR TD PT24
14.0000 mg | MEDICATED_PATCH | Freq: Every day | TRANSDERMAL | 0 refills | Status: DC
Start: 2022-05-27 — End: 2023-03-09
  Filled 2022-05-27: qty 28, 28d supply, fill #0

## 2022-05-27 MED ORDER — NIFEDIPINE ER 30 MG PO TB24
30.0000 mg | ORAL_TABLET | Freq: Every day | ORAL | 0 refills | Status: DC
  Filled 2022-05-27: qty 30, 30d supply, fill #0

## 2022-05-27 MED ORDER — ACETAMINOPHEN 325 MG PO TABS
650.0000 mg | ORAL_TABLET | ORAL | 0 refills | Status: DC | PRN
  Filled 2022-05-27: qty 60, 5d supply, fill #0

## 2022-05-27 MED ORDER — NICOTINE 14 MG/24HR TD PT24
14.0000 mg | MEDICATED_PATCH | Freq: Every day | TRANSDERMAL | Status: DC
  Administered 2022-05-27: 14 mg via TRANSDERMAL
  Filled 2022-05-27: qty 1

## 2022-05-27 NOTE — Plan of Care (Signed)
  Problem: Education: Goal: Knowledge of General Education information will improve Description: Including pain rating scale, medication(s)/side effects and non-pharmacologic comfort measures Outcome: Progressing   Problem: Health Behavior/Discharge Planning: Goal: Ability to manage health-related needs will improve Outcome: Progressing   Problem: Clinical Measurements: Goal: Ability to maintain clinical measurements within normal limits will improve Outcome: Progressing Goal: Will remain free from infection Outcome: Progressing Goal: Diagnostic test results will improve Outcome: Progressing Goal: Respiratory complications will improve Outcome: Progressing Goal: Cardiovascular complication will be avoided Outcome: Progressing   Problem: Activity: Goal: Risk for activity intolerance will decrease Outcome: Progressing   Problem: Nutrition: Goal: Adequate nutrition will be maintained Outcome: Progressing   Problem: Coping: Goal: Level of anxiety will decrease Outcome: Progressing   Problem: Elimination: Goal: Will not experience complications related to bowel motility Outcome: Progressing Goal: Will not experience complications related to urinary retention Outcome: Progressing   Problem: Pain Managment: Goal: General experience of comfort will improve Outcome: Progressing   Problem: Safety: Goal: Ability to remain free from injury will improve Outcome: Progressing   Problem: Skin Integrity: Goal: Risk for impaired skin integrity will decrease Outcome: Progressing   Problem: Education: Goal: Knowledge of Childbirth will improve Outcome: Progressing Goal: Ability to make informed decisions regarding treatment and plan of care will improve Outcome: Progressing Goal: Ability to state and carry out methods to decrease the pain will improve Outcome: Progressing Goal: Individualized Educational Video(s) Outcome: Progressing   Problem: Coping: Goal: Ability to  verbalize concerns and feelings about labor and delivery will improve Outcome: Progressing   Problem: Life Cycle: Goal: Ability to make normal progression through stages of labor will improve Outcome: Progressing Goal: Ability to effectively push during vaginal delivery will improve Outcome: Progressing   Problem: Role Relationship: Goal: Will demonstrate positive interactions with the child Outcome: Progressing   Problem: Safety: Goal: Risk of complications during labor and delivery will decrease Outcome: Progressing   Problem: Pain Management: Goal: Relief or control of pain from uterine contractions will improve Outcome: Progressing   Problem: Education: Goal: Knowledge of condition will improve Outcome: Progressing Goal: Individualized Educational Video(s) Outcome: Progressing Goal: Individualized Newborn Educational Video(s) Outcome: Progressing   Problem: Activity: Goal: Will verbalize the importance of balancing activity with adequate rest periods Outcome: Progressing Goal: Ability to tolerate increased activity will improve Outcome: Progressing   Problem: Coping: Goal: Ability to identify and utilize available resources and services will improve Outcome: Progressing   Problem: Life Cycle: Goal: Chance of risk for complications during the postpartum period will decrease Outcome: Progressing   Problem: Role Relationship: Goal: Ability to demonstrate positive interaction with newborn will improve Outcome: Progressing   Problem: Skin Integrity: Goal: Demonstration of wound healing without infection will improve Outcome: Progressing   Problem: Education: Goal: Knowledge of condition will improve Outcome: Progressing Goal: Individualized Educational Video(s) Outcome: Progressing Goal: Individualized Newborn Educational Video(s) Outcome: Progressing   Problem: Activity: Goal: Will verbalize the importance of balancing activity with adequate rest  periods Outcome: Progressing Goal: Ability to tolerate increased activity will improve Outcome: Progressing   Problem: Coping: Goal: Ability to identify and utilize available resources and services will improve Outcome: Progressing   Problem: Life Cycle: Goal: Chance of risk for complications during the postpartum period will decrease Outcome: Progressing   Problem: Role Relationship: Goal: Ability to demonstrate positive interaction with newborn will improve Outcome: Progressing

## 2022-05-27 NOTE — Plan of Care (Signed)
Problem: Education: Goal: Knowledge of General Education information will improve Description: Including pain rating scale, medication(s)/side effects and non-pharmacologic comfort measures 05/27/2022 0930 by Donne Hazel, LPN Outcome: Adequate for Discharge 05/27/2022 0746 by Donne Hazel, LPN Outcome: Progressing   Problem: Health Behavior/Discharge Planning: Goal: Ability to manage health-related needs will improve 05/27/2022 0930 by Donne Hazel, LPN Outcome: Adequate for Discharge 05/27/2022 0746 by Donne Hazel, LPN Outcome: Progressing   Problem: Clinical Measurements: Goal: Ability to maintain clinical measurements within normal limits will improve 05/27/2022 0930 by Donne Hazel, LPN Outcome: Adequate for Discharge 05/27/2022 0746 by Donne Hazel, LPN Outcome: Progressing Goal: Will remain free from infection 05/27/2022 0930 by Donne Hazel, LPN Outcome: Adequate for Discharge 05/27/2022 0746 by Donne Hazel, LPN Outcome: Progressing Goal: Diagnostic test results will improve 05/27/2022 0930 by Donne Hazel, LPN Outcome: Adequate for Discharge 05/27/2022 0746 by Donne Hazel, LPN Outcome: Progressing Goal: Respiratory complications will improve 05/27/2022 0930 by Donne Hazel, LPN Outcome: Adequate for Discharge 05/27/2022 0746 by Donne Hazel, LPN Outcome: Progressing Goal: Cardiovascular complication will be avoided 05/27/2022 0930 by Donne Hazel, LPN Outcome: Adequate for Discharge 05/27/2022 0746 by Donne Hazel, LPN Outcome: Progressing   Problem: Activity: Goal: Risk for activity intolerance will decrease 05/27/2022 0930 by Donne Hazel, LPN Outcome: Adequate for Discharge 05/27/2022 0746 by Donne Hazel, LPN Outcome: Progressing   Problem: Nutrition: Goal: Adequate nutrition will be maintained 05/27/2022 0930 by Donne Hazel, LPN Outcome: Adequate for Discharge 05/27/2022 0746 by Donne Hazel, LPN Outcome: Progressing    Problem: Coping: Goal: Level of anxiety will decrease 05/27/2022 0930 by Donne Hazel, LPN Outcome: Adequate for Discharge 05/27/2022 0746 by Donne Hazel, LPN Outcome: Progressing   Problem: Elimination: Goal: Will not experience complications related to bowel motility 05/27/2022 0930 by Donne Hazel, LPN Outcome: Adequate for Discharge 05/27/2022 0746 by Donne Hazel, LPN Outcome: Progressing Goal: Will not experience complications related to urinary retention 05/27/2022 0930 by Donne Hazel, LPN Outcome: Adequate for Discharge 05/27/2022 0746 by Donne Hazel, LPN Outcome: Progressing   Problem: Pain Managment: Goal: General experience of comfort will improve 05/27/2022 0930 by Donne Hazel, LPN Outcome: Adequate for Discharge 05/27/2022 0746 by Donne Hazel, LPN Outcome: Progressing   Problem: Safety: Goal: Ability to remain free from injury will improve 05/27/2022 0930 by Donne Hazel, LPN Outcome: Adequate for Discharge 05/27/2022 0746 by Donne Hazel, LPN Outcome: Progressing   Problem: Skin Integrity: Goal: Risk for impaired skin integrity will decrease 05/27/2022 0930 by Donne Hazel, LPN Outcome: Adequate for Discharge 05/27/2022 0746 by Donne Hazel, LPN Outcome: Progressing   Problem: Education: Goal: Knowledge of Childbirth will improve 05/27/2022 0930 by Donne Hazel, LPN Outcome: Adequate for Discharge 05/27/2022 0746 by Donne Hazel, LPN Outcome: Progressing Goal: Ability to make informed decisions regarding treatment and plan of care will improve 05/27/2022 0930 by Donne Hazel, LPN Outcome: Adequate for Discharge 05/27/2022 0746 by Donne Hazel, LPN Outcome: Progressing Goal: Ability to state and carry out methods to decrease the pain will improve 05/27/2022 0930 by Donne Hazel, LPN Outcome: Adequate for Discharge 05/27/2022 0746 by Donne Hazel, LPN Outcome: Progressing Goal: Individualized Educational Video(s) 05/27/2022  0930 by Donne Hazel, LPN Outcome: Adequate for Discharge 05/27/2022 0746 by Donne Hazel, LPN Outcome: Progressing   Problem: Coping: Goal: Ability to verbalize concerns and feelings  about labor and delivery will improve 05/27/2022 0930 by Donne Hazel, LPN Outcome: Adequate for Discharge 05/27/2022 0746 by Donne Hazel, LPN Outcome: Progressing   Problem: Life Cycle: Goal: Ability to make normal progression through stages of labor will improve 05/27/2022 0930 by Donne Hazel, LPN Outcome: Adequate for Discharge 05/27/2022 0746 by Donne Hazel, LPN Outcome: Progressing Goal: Ability to effectively push during vaginal delivery will improve 05/27/2022 0930 by Donne Hazel, LPN Outcome: Adequate for Discharge 05/27/2022 0746 by Donne Hazel, LPN Outcome: Progressing   Problem: Role Relationship: Goal: Will demonstrate positive interactions with the child 05/27/2022 0930 by Donne Hazel, LPN Outcome: Adequate for Discharge 05/27/2022 0746 by Donne Hazel, LPN Outcome: Progressing   Problem: Safety: Goal: Risk of complications during labor and delivery will decrease 05/27/2022 0930 by Donne Hazel, LPN Outcome: Adequate for Discharge 05/27/2022 0746 by Donne Hazel, LPN Outcome: Progressing   Problem: Pain Management: Goal: Relief or control of pain from uterine contractions will improve 05/27/2022 0930 by Donne Hazel, LPN Outcome: Adequate for Discharge 05/27/2022 0746 by Donne Hazel, LPN Outcome: Progressing   Problem: Education: Goal: Knowledge of condition will improve 05/27/2022 0930 by Donne Hazel, LPN Outcome: Adequate for Discharge 05/27/2022 0746 by Donne Hazel, LPN Outcome: Progressing Goal: Individualized Educational Video(s) 05/27/2022 0930 by Donne Hazel, LPN Outcome: Adequate for Discharge 05/27/2022 0746 by Donne Hazel, LPN Outcome: Progressing Goal: Individualized Newborn Educational Video(s) 05/27/2022 0930 by Donne Hazel, LPN Outcome: Adequate for Discharge 05/27/2022 0746 by Donne Hazel, LPN Outcome: Progressing   Problem: Activity: Goal: Will verbalize the importance of balancing activity with adequate rest periods 05/27/2022 0930 by Donne Hazel, LPN Outcome: Adequate for Discharge 05/27/2022 0746 by Donne Hazel, LPN Outcome: Progressing Goal: Ability to tolerate increased activity will improve 05/27/2022 0930 by Donne Hazel, LPN Outcome: Adequate for Discharge 05/27/2022 0746 by Donne Hazel, LPN Outcome: Progressing   Problem: Coping: Goal: Ability to identify and utilize available resources and services will improve 05/27/2022 0930 by Donne Hazel, LPN Outcome: Adequate for Discharge 05/27/2022 0746 by Donne Hazel, LPN Outcome: Progressing   Problem: Life Cycle: Goal: Chance of risk for complications during the postpartum period will decrease 05/27/2022 0930 by Donne Hazel, LPN Outcome: Adequate for Discharge 05/27/2022 0746 by Donne Hazel, LPN Outcome: Progressing   Problem: Role Relationship: Goal: Ability to demonstrate positive interaction with newborn will improve 05/27/2022 0930 by Donne Hazel, LPN Outcome: Adequate for Discharge 05/27/2022 0746 by Donne Hazel, LPN Outcome: Progressing   Problem: Skin Integrity: Goal: Demonstration of wound healing without infection will improve 05/27/2022 0930 by Donne Hazel, LPN Outcome: Adequate for Discharge 05/27/2022 0746 by Donne Hazel, LPN Outcome: Progressing   Problem: Education: Goal: Knowledge of condition will improve 05/27/2022 0930 by Donne Hazel, LPN Outcome: Adequate for Discharge 05/27/2022 0746 by Donne Hazel, LPN Outcome: Progressing Goal: Individualized Educational Video(s) 05/27/2022 0930 by Donne Hazel, LPN Outcome: Adequate for Discharge 05/27/2022 0746 by Donne Hazel, LPN Outcome: Progressing Goal: Individualized Newborn Educational Video(s) 05/27/2022 0930 by Donne Hazel,  LPN Outcome: Adequate for Discharge 05/27/2022 0746 by Donne Hazel, LPN Outcome: Progressing   Problem: Activity: Goal: Will verbalize the importance of balancing activity with adequate rest periods 05/27/2022 0930 by Donne Hazel, LPN Outcome: Adequate for Discharge 05/27/2022 0746 by Donne Hazel, LPN Outcome: Progressing Goal: Ability  to tolerate increased activity will improve 05/27/2022 0930 by Donne Hazel, LPN Outcome: Adequate for Discharge 05/27/2022 0746 by Donne Hazel, LPN Outcome: Progressing   Problem: Coping: Goal: Ability to identify and utilize available resources and services will improve 05/27/2022 0930 by Donne Hazel, LPN Outcome: Adequate for Discharge 05/27/2022 0746 by Donne Hazel, LPN Outcome: Progressing   Problem: Life Cycle: Goal: Chance of risk for complications during the postpartum period will decrease 05/27/2022 0930 by Donne Hazel, LPN Outcome: Adequate for Discharge 05/27/2022 0746 by Donne Hazel, LPN Outcome: Progressing   Problem: Role Relationship: Goal: Ability to demonstrate positive interaction with newborn will improve 05/27/2022 0930 by Donne Hazel, LPN Outcome: Adequate for Discharge 05/27/2022 0746 by Donne Hazel, LPN Outcome: Progressing

## 2022-05-29 ENCOUNTER — Other Ambulatory Visit: Payer: Self-pay | Admitting: Family Medicine

## 2022-05-31 ENCOUNTER — Encounter: Payer: Medicaid Other | Admitting: Obstetrics & Gynecology

## 2022-05-31 ENCOUNTER — Other Ambulatory Visit: Payer: Self-pay | Admitting: Family Medicine

## 2022-06-01 ENCOUNTER — Ambulatory Visit: Payer: Medicaid Other

## 2022-06-01 ENCOUNTER — Inpatient Hospital Stay (HOSPITAL_BASED_OUTPATIENT_CLINIC_OR_DEPARTMENT_OTHER)
Admission: EM | Admit: 2022-06-01 | Discharge: 2022-06-02 | DRG: 776 | Disposition: A | Discharge status: Home / Self Care | Payer: Medicaid Other | Attending: Obstetrics & Gynecology | Admitting: Obstetrics & Gynecology

## 2022-06-01 ENCOUNTER — Emergency Department (HOSPITAL_BASED_OUTPATIENT_CLINIC_OR_DEPARTMENT_OTHER): Payer: Medicaid Other

## 2022-06-01 ENCOUNTER — Other Ambulatory Visit: Payer: Self-pay

## 2022-06-01 ENCOUNTER — Encounter (HOSPITAL_BASED_OUTPATIENT_CLINIC_OR_DEPARTMENT_OTHER): Payer: Self-pay | Admitting: Emergency Medicine

## 2022-06-01 ENCOUNTER — Other Ambulatory Visit (HOSPITAL_BASED_OUTPATIENT_CLINIC_OR_DEPARTMENT_OTHER): Payer: Self-pay

## 2022-06-01 DIAGNOSIS — R519 Headache, unspecified: Secondary | ICD-10-CM | POA: Diagnosis not present

## 2022-06-01 DIAGNOSIS — F17210 Nicotine dependence, cigarettes, uncomplicated: Secondary | ICD-10-CM | POA: Diagnosis not present

## 2022-06-01 DIAGNOSIS — O99335 Smoking (tobacco) complicating the puerperium: Secondary | ICD-10-CM | POA: Diagnosis not present

## 2022-06-01 DIAGNOSIS — O1415 Severe pre-eclampsia, complicating the puerperium: Principal | ICD-10-CM | POA: Diagnosis present

## 2022-06-01 DIAGNOSIS — Z3A49 Greater than 42 weeks gestation of pregnancy: Secondary | ICD-10-CM | POA: Diagnosis not present

## 2022-06-01 DIAGNOSIS — O1495 Unspecified pre-eclampsia, complicating the puerperium: Secondary | ICD-10-CM | POA: Diagnosis not present

## 2022-06-01 DIAGNOSIS — O165 Unspecified maternal hypertension, complicating the puerperium: Secondary | ICD-10-CM | POA: Diagnosis not present

## 2022-06-01 DIAGNOSIS — O1410 Severe pre-eclampsia, unspecified trimester: Secondary | ICD-10-CM | POA: Diagnosis present

## 2022-06-01 HISTORY — DX: Severe pre-eclampsia, unspecified trimester: O14.10

## 2022-06-01 LAB — LACTATE DEHYDROGENASE: LDH: 172 U/L (ref 98–192)

## 2022-06-01 LAB — COMPREHENSIVE METABOLIC PANEL
ALT: 12 U/L (ref 0–44)
AST: 12 U/L — ABNORMAL LOW (ref 15–41)
Albumin: 3.3 g/dL — ABNORMAL LOW (ref 3.5–5.0)
Alkaline Phosphatase: 119 U/L (ref 38–126)
Anion gap: 9 (ref 5–15)
BUN: 17 mg/dL (ref 6–20)
CO2: 24 mmol/L (ref 22–32)
Calcium: 9 mg/dL (ref 8.9–10.3)
Chloride: 105 mmol/L (ref 98–111)
Creatinine, Ser: 0.58 mg/dL (ref 0.44–1.00)
GFR, Estimated: >60 mL/min (ref >60)
Glucose, Bld: 94 mg/dL (ref 70–99)
Potassium: 3.8 mmol/L (ref 3.5–5.1)
Sodium: 138 mmol/L (ref 135–145)
Total Bilirubin: 0.2 mg/dL — ABNORMAL LOW (ref 0.3–1.2)
Total Protein: 6.5 g/dL (ref 6.5–8.1)

## 2022-06-01 LAB — URINALYSIS, ROUTINE W REFLEX MICROSCOPIC
Bacteria, UA: NONE SEEN
Bilirubin Urine: NEGATIVE
Glucose, UA: NEGATIVE mg/dL
Ketones, ur: NEGATIVE mg/dL
Nitrite: NEGATIVE
Protein, ur: >300 mg/dL — AB
RBC / HPF: >50 RBC/hpf (ref 0–5)
Specific Gravity, Urine: 1.024 (ref 1.005–1.030)
WBC, UA: >50 WBC/hpf (ref 0–5)
pH: 6 (ref 5.0–8.0)

## 2022-06-01 LAB — CBC WITH DIFFERENTIAL/PLATELET
Abs Immature Granulocytes: 0.19 10*3/uL — ABNORMAL HIGH (ref 0.00–0.07)
Basophils Absolute: 0.1 10*3/uL (ref 0.0–0.1)
Basophils Relative: 1 %
Eosinophils Absolute: 0.4 10*3/uL (ref 0.0–0.5)
Eosinophils Relative: 3 %
HCT: 29.4 % — ABNORMAL LOW (ref 36.0–46.0)
Hemoglobin: 10.1 g/dL — ABNORMAL LOW (ref 12.0–15.0)
Immature Granulocytes: 1 %
Lymphocytes Relative: 17 %
Lymphs Abs: 2.5 10*3/uL (ref 0.7–4.0)
MCH: 31.8 pg (ref 26.0–34.0)
MCHC: 34.4 g/dL (ref 30.0–36.0)
MCV: 92.5 fL (ref 80.0–100.0)
Monocytes Absolute: 0.7 10*3/uL (ref 0.1–1.0)
Monocytes Relative: 4 %
Neutro Abs: 10.9 10*3/uL — ABNORMAL HIGH (ref 1.7–7.7)
Neutrophils Relative %: 74 %
Platelets: 534 10*3/uL — ABNORMAL HIGH (ref 150–400)
RBC: 3.18 MIL/uL — ABNORMAL LOW (ref 3.87–5.11)
RDW: 12.1 % (ref 11.5–15.5)
WBC: 14.7 10*3/uL — ABNORMAL HIGH (ref 4.0–10.5)
nRBC: 0 % (ref 0.0–0.2)

## 2022-06-01 LAB — DIC (DISSEMINATED INTRAVASCULAR COAGULATION)PANEL
D-Dimer, Quant: 1.08 ug/mL-FEU — ABNORMAL HIGH (ref 0.00–0.50)
Fibrinogen: 575 mg/dL — ABNORMAL HIGH (ref 210–475)
INR: 0.9 (ref 0.8–1.2)
Platelets: 537 10*3/uL — ABNORMAL HIGH (ref 150–400)
Prothrombin Time: 12 seconds (ref 11.4–15.2)
Smear Review: NONE SEEN
aPTT: 35 seconds (ref 24–36)

## 2022-06-01 LAB — MAGNESIUM: Magnesium: 1.8 mg/dL (ref 1.7–2.4)

## 2022-06-01 LAB — LACTIC ACID, PLASMA: Lactic Acid, Venous: 0.5 mmol/L (ref 0.5–1.9)

## 2022-06-01 MED ORDER — SIMETHICONE 80 MG PO CHEW: 80.0000 mg | CHEWABLE_TABLET | ORAL | Status: DC | PRN

## 2022-06-01 MED ORDER — NICOTINE 14 MG/24HR TD PT24
14.0000 mg | MEDICATED_PATCH | Freq: Every day | TRANSDERMAL | Status: DC
  Administered 2022-06-01 – 2022-06-02 (×2): 14 mg via TRANSDERMAL
  Filled 2022-06-01 (×2): qty 1

## 2022-06-01 MED ORDER — ACETAMINOPHEN 325 MG PO TABS: 650.0000 mg | ORAL_TABLET | ORAL | Status: DC | PRN

## 2022-06-01 MED ORDER — HYDRALAZINE HCL 20 MG/ML IJ SOLN: 10.0000 mg | INTRAMUSCULAR | Status: DC | PRN

## 2022-06-01 MED ORDER — ONDANSETRON HCL 4 MG/2ML IJ SOLN: 4.0000 mg | INTRAMUSCULAR | Status: DC | PRN

## 2022-06-01 MED ORDER — ONDANSETRON HCL 4 MG PO TABS: 4.0000 mg | ORAL_TABLET | ORAL | Status: DC | PRN

## 2022-06-01 MED ORDER — HYDRALAZINE HCL 20 MG/ML IJ SOLN
10.0000 mg | Freq: Once | INTRAMUSCULAR | Status: AC
  Administered 2022-06-01: 10 mg via INTRAVENOUS
  Filled 2022-06-01: qty 1

## 2022-06-01 MED ORDER — IBUPROFEN 600 MG PO TABS
600.0000 mg | ORAL_TABLET | Freq: Four times a day (QID) | ORAL | Status: DC
  Administered 2022-06-01 – 2022-06-02 (×4): 600 mg via ORAL
  Filled 2022-06-01 (×4): qty 1

## 2022-06-01 MED ORDER — OXYCODONE HCL 5 MG PO TABS: 5.0000 mg | ORAL_TABLET | ORAL | Status: DC | PRN

## 2022-06-01 MED ORDER — MAGNESIUM SULFATE 2 GM/50ML IV SOLN
2.0000 g | Freq: Once | INTRAVENOUS | Status: AC
  Administered 2022-06-01: 2 g via INTRAVENOUS
  Filled 2022-06-01: qty 50

## 2022-06-01 MED ORDER — PRENATAL MULTIVITAMIN CH
1.0000 | ORAL_TABLET | Freq: Every day | ORAL | Status: DC
  Administered 2022-06-02: 1 via ORAL
  Filled 2022-06-01: qty 1

## 2022-06-01 MED ORDER — ZOLPIDEM TARTRATE 5 MG PO TABS: 5.0000 mg | ORAL_TABLET | Freq: Every evening | ORAL | Status: DC | PRN

## 2022-06-01 MED ORDER — SENNOSIDES-DOCUSATE SODIUM 8.6-50 MG PO TABS
2.0000 | ORAL_TABLET | Freq: Every day | ORAL | Status: DC
  Filled 2022-06-01: qty 2

## 2022-06-01 MED ORDER — MAGNESIUM SULFATE 40 GM/1000ML IV SOLN
2.0000 g/h | INTRAVENOUS | Status: AC
  Administered 2022-06-01 – 2022-06-02 (×2): 2 g/h via INTRAVENOUS
  Filled 2022-06-01 (×2): qty 1000

## 2022-06-01 MED ORDER — NIFEDIPINE ER OSMOTIC RELEASE 60 MG PO TB24
60.0000 mg | ORAL_TABLET | Freq: Every day | ORAL | Status: DC
  Administered 2022-06-02: 60 mg via ORAL
  Filled 2022-06-01: qty 1

## 2022-06-01 MED ORDER — COCONUT OIL OIL: 1.0000 | TOPICAL_OIL | Status: DC | PRN

## 2022-06-01 MED ORDER — ACETAMINOPHEN 500 MG PO TABS
1000.0000 mg | ORAL_TABLET | Freq: Four times a day (QID) | ORAL | Status: DC | PRN
  Administered 2022-06-01 – 2022-06-02 (×2): 1000 mg via ORAL
  Filled 2022-06-01 (×2): qty 2

## 2022-06-01 MED ORDER — FUROSEMIDE 40 MG PO TABS
40.0000 mg | ORAL_TABLET | Freq: Two times a day (BID) | ORAL | Status: DC
  Administered 2022-06-01 – 2022-06-02 (×2): 40 mg via ORAL
  Filled 2022-06-01 (×2): qty 1

## 2022-06-01 MED ORDER — OXYCODONE HCL 5 MG PO TABS: 10.0000 mg | ORAL_TABLET | ORAL | Status: DC | PRN

## 2022-06-01 MED ORDER — LABETALOL HCL 5 MG/ML IV SOLN: 80.0000 mg | INTRAVENOUS | Status: DC | PRN

## 2022-06-01 MED ORDER — LABETALOL HCL 5 MG/ML IV SOLN: 40.0000 mg | INTRAVENOUS | Status: DC | PRN

## 2022-06-01 MED ORDER — LACTATED RINGERS IV SOLN: INTRAVENOUS | Status: DC

## 2022-06-01 MED ORDER — IBUPROFEN 600 MG PO TABS: 600.0000 mg | ORAL_TABLET | Freq: Four times a day (QID) | ORAL | Status: DC

## 2022-06-01 MED ORDER — DIPHENHYDRAMINE HCL 25 MG PO CAPS: 25.0000 mg | ORAL_CAPSULE | Freq: Four times a day (QID) | ORAL | Status: DC | PRN

## 2022-06-01 MED ORDER — TETANUS-DIPHTH-ACELL PERTUSSIS 5-2.5-18.5 LF-MCG/0.5 IM SUSY: 0.5000 mL | PREFILLED_SYRINGE | Freq: Once | INTRAMUSCULAR | Status: DC

## 2022-06-01 MED ORDER — DIBUCAINE (PERIANAL) 1 % EX OINT: 1.0000 | TOPICAL_OINTMENT | CUTANEOUS | Status: DC | PRN

## 2022-06-01 MED ORDER — LABETALOL HCL 5 MG/ML IV SOLN: 20.0000 mg | INTRAVENOUS | Status: DC | PRN

## 2022-06-01 MED ORDER — BENZOCAINE-MENTHOL 20-0.5 % EX AERO: 1.0000 | INHALATION_SPRAY | CUTANEOUS | Status: DC | PRN

## 2022-06-01 MED ORDER — WITCH HAZEL-GLYCERIN EX PADS: 1.0000 | MEDICATED_PAD | CUTANEOUS | Status: DC | PRN

## 2022-06-01 MED ORDER — HYDROXYZINE HCL 25 MG PO TABS
25.0000 mg | ORAL_TABLET | Freq: Three times a day (TID) | ORAL | Status: DC | PRN
  Filled 2022-06-01: qty 1

## 2022-06-01 MED ORDER — IOHEXOL 350 MG/ML SOLN
100.0000 mL | Freq: Once | INTRAVENOUS | Status: AC | PRN
  Administered 2022-06-01: 75 mL via INTRAVENOUS

## 2022-06-01 MED ORDER — ENOXAPARIN SODIUM 40 MG/0.4ML IJ SOSY
40.0000 mg | PREFILLED_SYRINGE | INTRAMUSCULAR | Status: DC
  Administered 2022-06-01: 40 mg via SUBCUTANEOUS
  Filled 2022-06-01: qty 0.4

## 2022-06-01 NOTE — ED Notes (Signed)
Handoff report given to [facility]  and to [facility] on [facility]

## 2022-06-01 NOTE — ED Triage Notes (Signed)
Pt POV from home, caox4, ambulatory. Pt reports bilateral lower extremity edema and headache x1 month. Pt 1 wk post partum, stating swelling has progressively increased over the past week since the birth. Pt reports some blurred vision at night, non present, takes BP meds AM. Pt denies PMH pre-eclampsia but was tx with lasix while in hospital and prescribed Nifedipine for home when d/c from hospital. Last took Ibuprofen 600 mg at 0640 and did take BP med this AM.

## 2022-06-01 NOTE — ED Notes (Signed)
[facility] at [facility] will send transport. Dr Despina Hidden is accepting at [facility])

## 2022-06-01 NOTE — H&P (Signed)
Admission History and Physical  April James is a 32 y.o. [phone] with delivered NSVD/IOL FGR + gHTN 05/25/22 PPD#6 On procardia 30 and lasix 20 qd Presented to [facility] ED with severe headache and elevated BP at home BP [id number] in ED CT head normal Labs all normal  Admitted for magnesium prophylaxis and BP control  PMH:    Past Medical History:  Diagnosis Date   Asthma    Bipolar 1 disorder (HCC)    Bipolar affective disorder, currently depressed, moderate (HCC)    Bipolar disorder with current episode depressed (HCC) 05/03/2015   Chlamydia infection affecting pregnancy in second trimester 01/19/2022   Toc neg early feb 2024    PSH:     Past Surgical History:  Procedure Laterality Date   CHOLECYSTECTOMY     TONSILLECTOMY      POb/GynH:      OB History     Gravida  4   Para  2   Term  2   Preterm  0   AB  2   Living  2      SAB  1   IAB  1   Ectopic  0   Multiple  0   Live Births  2           SH:   Social History   Tobacco Use   Smoking status: Every Day    Packs/day: 0.20    Years: 16.00    Additional pack years: 0.00    Total pack years: 3.20    Types: Cigarettes    Passive exposure: Current   Smokeless tobacco: Never  Vaping Use   Vaping Use: Never used  Substance Use Topics   Alcohol use: Not Currently    Comment: stopped for now   Drug use: Yes    Types: Marijuana    Comment: "Not very often". Last used: 2 months ago.     FH:    Family History  Problem Relation Age of Onset   Bipolar disorder Father    Heart disease Father    Aneurysm Father    Asthma Son    Diabetes Maternal Grandfather    Diabetes Paternal Grandmother    Cancer Paternal Grandmother    Hypertension Paternal Grandfather    Stroke Neg Hx      Allergies:  Allergies  Allergen Reactions   Penicillins Hives and Shortness Of Breath   Sulfa Antibiotics Rash    Medications:       Current Facility-Administered Medications:     acetaminophen (TYLENOL) tablet 1,000 mg, 1,000 mg, Oral, Q6H PRN, Lazaro Arms, MD, 1,000 mg at 06/01/22 1555   acetaminophen (TYLENOL) tablet 650 mg, 650 mg, Oral, Q4H PRN, Despina Hidden, Amaryllis Dyke, MD   benzocaine-Menthol (DERMOPLAST) 20-0.5 % topical spray 1 Application, 1 Application, Topical, PRN, Johnluke Haugen, Amaryllis Dyke, MD   coconut oil, 1 Application, Topical, PRN, Despina Hidden, Amaryllis Dyke, MD   witch hazel-glycerin (TUCKS) pad 1 Application, 1 Application, Topical, PRN **AND** dibucaine (NUPERCAINAL) 1 % rectal ointment 1 Application, 1 Application, Rectal, PRN, Lazaro Arms, MD   diphenhydrAMINE (BENADRYL) capsule 25 mg, 25 mg, Oral, Q6H PRN, Lazaro Arms, MD   enoxaparin (LOVENOX) injection 40 mg, 40 mg, Subcutaneous, Q24H, Rishith Siddoway, Amaryllis Dyke, MD   furosemide (LASIX) tablet 40 mg, 40 mg, Oral, BID, Aleczander Fandino, Amaryllis Dyke, MD   labetalol (NORMODYNE) injection 20 mg, 20 mg, Intravenous, PRN **AND** labetalol (NORMODYNE) injection 40 mg, 40 mg, Intravenous, PRN **AND** labetalol (NORMODYNE) injection 80  mg, 80 mg, Intravenous, PRN **AND** hydrALAZINE (APRESOLINE) injection 10 mg, 10 mg, Intravenous, PRN **AND** [COMPLETED] Measure blood pressure, , , Once, Wouk, Wilfred Curtis, MD   hydrOXYzine (VISTARIL) capsule 25 mg, 25 mg, Oral, TID PRN, Lazaro Arms, MD   ibuprofen (ADVIL) tablet 600 mg, 600 mg, Oral, Q6H, Arhum Peeples, Amaryllis Dyke, MD   ibuprofen (ADVIL) tablet 600 mg, 600 mg, Oral, Q6H, Yamilee Harmes, Amaryllis Dyke, MD   lactated ringers infusion, , Intravenous, Continuous, Lazaro Arms, MD, Last Rate: 10 mL/hr at 06/01/22 1321, New Bag at 06/01/22 1321   magnesium sulfate 40 grams in SWI 1000 mL OB infusion, 2 g/hr, Intravenous, Continuous, Lazaro Arms, MD, Last Rate: 50 mL/hr at 06/01/22 1323, 2 g/hr at 06/01/22 1323   nicotine (NICODERM CQ - dosed in mg/24 hours) patch 14 mg, 14 mg, Transdermal, Daily, Denys Salinger, Amaryllis Dyke, MD   NIFEdipine (PROCARDIA XL/NIFEDICAL XL) 24 hr tablet 60 mg, 60 mg, Oral, Daily, Kimoni Pickerill, Amaryllis Dyke, MD   ondansetron  (ZOFRAN) tablet 4 mg, 4 mg, Oral, Q4H PRN **OR** ondansetron (ZOFRAN) injection 4 mg, 4 mg, Intravenous, Q4H PRN, Lazaro Arms, MD   oxyCODONE (Oxy IR/ROXICODONE) immediate release tablet 10 mg, 10 mg, Oral, Q4H PRN, Lazaro Arms, MD   oxyCODONE (Oxy IR/ROXICODONE) immediate release tablet 5 mg, 5 mg, Oral, Q4H PRN, Lazaro Arms, MD   [START ON 06/02/2022] prenatal multivitamin tablet 1 tablet, 1 tablet, Oral, Q1200, Lazaro Arms, MD   [START ON 06/02/2022] senna-docusate (Senokot-S) tablet 2 tablet, 2 tablet, Oral, Daily, Friedrich Harriott, Amaryllis Dyke, MD   simethicone (MYLICON) chewable tablet 80 mg, 80 mg, Oral, PRN, Lazaro Arms, MD   [START ON 06/02/2022] Tdap (BOOSTRIX) injection 0.5 mL, 0.5 mL, Intramuscular, Once, Lazaro Arms, MD   zolpidem (AMBIEN) tablet 5 mg, 5 mg, Oral, QHS PRN, Lazaro Arms, MD  Review of Systems:   Review of Systems  Constitutional: Negative for fever, chills, weight loss, malaise/fatigue and diaphoresis.  HENT: Negative for hearing loss, ear pain, nosebleeds, congestion, sore throat, neck pain, tinnitus and ear discharge.   Eyes: Negative for blurred vision, double vision, photophobia, pain, discharge and redness.  Respiratory: Negative for cough, hemoptysis, sputum production, shortness of breath, wheezing and stridor.   Cardiovascular: Negative for chest pain, palpitations, orthopnea, claudication, leg swelling and PND.  Gastrointestinal: Positive for abdominal pain. Negative for heartburn, nausea, vomiting, diarrhea, constipation, blood in stool and melena.  Genitourinary: Negative for dysuria, urgency, frequency, hematuria and flank pain.  Musculoskeletal: Negative for myalgias, back pain, joint pain and falls.  Skin: Negative for itching and rash.  Neurological: Negative for dizziness, tingling, tremors, sensory change, speech change, focal weakness, seizures, loss of consciousness, weakness and headaches.  Endo/Heme/Allergies: Negative for environmental allergies  and polydipsia. Does not bruise/bleed easily.  Psychiatric/Behavioral: Negative for depression, suicidal ideas, hallucinations, memory loss and substance abuse. The patient is not nervous/anxious and does not have insomnia.      PHYSICAL EXAM:  Blood pressure 136/80, pulse 81, temperature 98.2 F (36.8 C), temperature source Oral, resp. rate 18, height 5\' 3"  (1.6 m), weight 90.7 kg, last menstrual period 09/06/2021, SpO2 100 %, not currently breastfeeding.    Vitals reviewed. Constitutional: She is oriented to person, place, and time. She appears well-developed and well-nourished.  HENT:  Head: Normocephalic and atraumatic.  Right Ear: External ear normal.  Left Ear: External ear normal.  Nose: Nose normal.  Mouth/Throat: Oropharynx is clear and moist.  Eyes: Conjunctivae and EOM  are normal. Pupils are equal, round, and reactive to light. Right eye exhibits no discharge. Left eye exhibits no discharge. No scleral icterus.  Neck: Normal range of motion. Neck supple. No tracheal deviation present. No thyromegaly present.  Cardiovascular: Normal rate, regular rhythm, normal heart sounds and intact distal pulses.  Exam reveals no gallop and no friction rub.   No murmur heard. Respiratory: Effort normal and breath sounds normal. No respiratory distress. She has no wheezes. She has no rales. She exhibits no tenderness.  GI: Soft. Bowel sounds are normal. She exhibits no distension and no mass. There is tenderness. There is no rebound and no guarding.  Genitourinary:       Not examined Musculoskeletal: Normal range of motion. She exhibits no edema and no tenderness.  Neurological: She is alert and oriented to person, place, and time. She has normal reflexes. She displays normal reflexes. No cranial nerve deficit. She exhibits normal muscle tone. Coordination normal.  Skin: Skin is warm and dry. No rash noted. No erythema. No pallor.  Psychiatric: She has a normal mood and affect. Her behavior  is normal. Judgment and thought content normal.    Labs: Results for orders placed or performed during the hospital encounter of 06/01/22 (from the past 336 hour(s))  CBC with Differential   Collection Time: 06/01/22  9:02 AM  Result Value Ref Range   WBC 14.7 (H) 4.0 - 10.5 K/uL   RBC 3.18 (L) 3.87 - 5.11 MIL/uL   Hemoglobin 10.1 (L) 12.0 - 15.0 g/dL   HCT 21.3 (L) 08.6 - 57.8 %   MCV 92.5 80.0 - 100.0 fL   MCH 31.8 26.0 - 34.0 pg   MCHC 34.4 30.0 - 36.0 g/dL   RDW 46.9 62.9 - 52.8 %   Platelets 534 (H) 150 - 400 K/uL   nRBC 0.0 0.0 - 0.2 %   Neutrophils Relative % 74 %   Neutro Abs 10.9 (H) 1.7 - 7.7 K/uL   Lymphocytes Relative 17 %   Lymphs Abs 2.5 0.7 - 4.0 K/uL   Monocytes Relative 4 %   Monocytes Absolute 0.7 0.1 - 1.0 K/uL   Eosinophils Relative 3 %   Eosinophils Absolute 0.4 0.0 - 0.5 K/uL   Basophils Relative 1 %   Basophils Absolute 0.1 0.0 - 0.1 K/uL   Immature Granulocytes 1 %   Abs Immature Granulocytes 0.19 (H) 0.00 - 0.07 K/uL  Comprehensive metabolic panel   Collection Time: 06/01/22  9:02 AM  Result Value Ref Range   Sodium 138 135 - 145 mmol/L   Potassium 3.8 3.5 - 5.1 mmol/L   Chloride 105 98 - 111 mmol/L   CO2 24 22 - 32 mmol/L   Glucose, Bld 94 70 - 99 mg/dL   BUN 17 6 - 20 mg/dL   Creatinine, Ser 4.13 0.44 - 1.00 mg/dL   Calcium 9.0 8.9 - 24.4 mg/dL   Total Protein 6.5 6.5 - 8.1 g/dL   Albumin 3.3 (L) 3.5 - 5.0 g/dL   AST 12 (L) 15 - 41 U/L   ALT 12 0 - 44 U/L   Alkaline Phosphatase 119 38 - 126 U/L   Total Bilirubin 0.2 (L) 0.3 - 1.2 mg/dL   GFR, Estimated >01 >02 mL/min   Anion gap 9 5 - 15  Magnesium   Collection Time: 06/01/22  9:02 AM  Result Value Ref Range   Magnesium 1.8 1.7 - 2.4 mg/dL  Lactate dehydrogenase   Collection Time: 06/01/22  9:02 AM  Result Value Ref Range   LDH 172 98 - 192 U/L  Urinalysis, Routine w reflex microscopic -Urine, Clean Catch   Collection Time: 06/01/22  9:07 AM  Result Value Ref Range   Color, Urine  YELLOW YELLOW   APPearance HAZY (A) CLEAR   Specific Gravity, Urine 1.024 1.005 - 1.030   pH 6.0 5.0 - 8.0   Glucose, UA NEGATIVE NEGATIVE mg/dL   Hgb urine dipstick LARGE (A) NEGATIVE   Bilirubin Urine NEGATIVE NEGATIVE   Ketones, ur NEGATIVE NEGATIVE mg/dL   Protein, ur >161 (A) NEGATIVE mg/dL   Nitrite NEGATIVE NEGATIVE   Leukocytes,Ua LARGE (A) NEGATIVE   RBC / HPF >50 0 - 5 RBC/hpf   WBC, UA >50 0 - 5 WBC/hpf   Bacteria, UA NONE SEEN NONE SEEN   Squamous Epithelial / HPF 6-10 0 - 5 /HPF   WBC Clumps PRESENT    Mucus PRESENT    Non Squamous Epithelial 0-5 (A) NONE SEEN  DIC Panel Once   Collection Time: 06/01/22  9:08 AM  Result Value Ref Range   Prothrombin Time 12.0 11.4 - 15.2 seconds   INR 0.9 0.8 - 1.2   aPTT 35 24 - 36 seconds   Fibrinogen 575 (H) 210 - 475 mg/dL   D-Dimer, Quant 0.96 (H) 0.00 - 0.50 ug/mL-FEU   Platelets 537 (H) 150 - 400 K/uL   Smear Review NO SCHISTOCYTES SEEN   Lactic acid, plasma   Collection Time: 06/01/22 11:39 AM  Result Value Ref Range   Lactic Acid, Venous 0.5 0.5 - 1.9 mmol/L  Results for orders placed or performed during the hospital encounter of 05/24/22 (from the past 336 hour(s))  Type and screen   Collection Time: 05/24/22  1:43 AM  Result Value Ref Range   ABO/RH(D) A POS    Antibody Screen NEG    Sample Expiration      05/27/2022,2359 Performed at [facility], [address] [place], [place] [address]   CBC   Collection Time: 05/24/22  1:44 AM  Result Value Ref Range   WBC 16.0 (H) 4.0 - 10.5 K/uL   RBC 3.33 (L) 3.87 - 5.11 MIL/uL   Hemoglobin 10.8 (L) 12.0 - 15.0 g/dL   HCT 98.1 (L) 19.1 - 47.8 %   MCV 93.4 80.0 - 100.0 fL   MCH 32.4 26.0 - 34.0 pg   MCHC 34.7 30.0 - 36.0 g/dL   RDW 29.5 62.1 - 30.8 %   Platelets 384 150 - 400 K/uL   nRBC 0.0 0.0 - 0.2 %  RPR   Collection Time: 05/24/22  1:44 AM  Result Value Ref Range   RPR Ser Ql NON REACTIVE NON REACTIVE  Comprehensive metabolic panel   Collection  Time: 05/24/22  1:44 AM  Result Value Ref Range   Sodium 132 (L) 135 - 145 mmol/L   Potassium 3.9 3.5 - 5.1 mmol/L   Chloride 103 98 - 111 mmol/L   CO2 18 (L) 22 - 32 mmol/L   Glucose, Bld 105 (H) 70 - 99 mg/dL   BUN 15 6 - 20 mg/dL   Creatinine, Ser 6.57 0.44 - 1.00 mg/dL   Calcium 8.9 8.9 - 84.6 mg/dL   Total Protein 5.8 (L) 6.5 - 8.1 g/dL   Albumin 2.2 (L) 3.5 - 5.0 g/dL   AST 25 15 - 41 U/L   ALT 10 0 - 44 U/L   Alkaline Phosphatase 167 (H) 38 - 126 U/L   Total  Bilirubin 0.3 0.3 - 1.2 mg/dL   GFR, Estimated >40 >98 mL/min   Anion gap 11 5 - 15  CBC   Collection Time: 05/26/22  6:11 AM  Result Value Ref Range   WBC 16.3 (H) 4.0 - 10.5 K/uL   RBC 2.90 (L) 3.87 - 5.11 MIL/uL   Hemoglobin 9.5 (L) 12.0 - 15.0 g/dL   HCT 11.9 (L) 14.7 - 82.9 %   MCV 90.7 80.0 - 100.0 fL   MCH 32.8 26.0 - 34.0 pg   MCHC 36.1 (H) 30.0 - 36.0 g/dL   RDW 56.2 13.0 - 86.5 %   Platelets 317 150 - 400 K/uL   nRBC 0.0 0.0 - 0.2 %    EKG: Orders placed or performed during the hospital encounter of 06/01/22   EKG 12-Lead   EKG 12-Lead    Imaging Studies: CT VENOGRAM HEAD  Result Date: 06/01/2022 CLINICAL DATA:  Dural venous sinus thrombosis suspected. Severe postpartum headache. Increased lower extremity swelling and edema since giving birth 1 week ago. EXAM: CT VENOGRAM HEAD TECHNIQUE: Venographic phase images of the brain were obtained following the administration of intravenous contrast. Multiplanar reformats and maximum intensity projections were generated. RADIATION DOSE REDUCTION: This exam was performed according to the departmental dose-optimization program which includes automated exposure control, adjustment of the mA and/or kV according to patient size and/or use of iterative reconstruction technique. CONTRAST:  75mL OMNIPAQUE IOHEXOL 350 MG/ML SOLN COMPARISON:  None Available. FINDINGS: No acute infarct, hemorrhage, or mass lesion is present. No significant white matter lesions are present.  Deep brain nuclei are within normal limits. The ventricles are of normal size. No significant extraaxial fluid collection is present. The brainstem and cerebellum are within normal limits. Midline structures are within normal limits. The paranasal sinuses and mastoid air cells are clear. The globes and orbits are within normal limits. Postcontrast images demonstrate normal opacification of the dural sinuses. The right transverse sinus is dominant. No filling defects are present to suggest thrombus. The cortical veins are within normal limits. The veins are within normal limits. IMPRESSION: 1. Normal CT venogram. No evidence for dural venous sinus thrombosis. 2. Normal CT appearance of the brain. Electronically Signed   By: Marin Roberts M.D.   On: 06/01/2022 11:18       Assessment: Postpartum Day #6 with pre eclampsia, severe features, (BP + headache) Normal labs Significant proteinuria    Plan: Restart the lasix, will increase to 40 BID and increase procardia xl to 60 qd Magnesium prophylaxis 2 grams/hour  Lazaro Arms 06/01/2022 4:22 PM

## 2022-06-01 NOTE — ED Provider Notes (Signed)
[facility] EMERGENCY DEPARTMENT AT [facility] Provider Note   CSN: [id number] Arrival date & time: 06/01/22  5621     History  Chief Complaint  Patient presents with   Leg Swelling   Postpartum Complications    April James is a 32 y.o. female.  HPI Patient reports a.m. blood pressure was 180/116.  She took her nifedipine as prescribed.  She has continued to have significant headache.  At this time she continues to have throbbing generalized headache.  She reports she has had blurred vision that has waxed and waned.  Reports she has gotten a lot of swelling in her feet.  She reports the feet hurt but the legs do not hurt, swelling extends up the lower legs.  She denies chest pain or shortness of breath.  She reports some persistent postpartum abdominal discomfort but not severe.  She continues to feel about 1 pad a day with vaginal bleeding.  Patient reports that she was having headaches for about a month before she delivered and was getting some swelling of the feet.  However, she reports she was not started on blood pressure medication until postpartum.  She has been taking nifedipine in the mornings as prescribed.    Home Medications Prior to Admission medications   Medication Sig Start Date End Date Taking? Authorizing Provider  acetaminophen (TYLENOL) 325 MG tablet Take 2 tablets (650 mg total) by mouth every 4 (four) hours as needed (for pain scale < 4). 05/27/22  Yes Cresenzo, Cyndi Lennert, MD  hydrOXYzine (VISTARIL) 25 MG capsule Take 1 capsule (25 mg total) by mouth 3 (three) times daily as needed. Patient taking differently: Take 25 mg by mouth 3 (three) times daily as needed for anxiety. 04/07/22 04/02/23 Yes Weinhold, Samantha C, CNM  ibuprofen (ADVIL) 600 MG tablet Take 1 tablet (600 mg total) by mouth every 6 (six) hours. 05/27/22  Yes Cresenzo, Cyndi Lennert, MD  nicotine (NICODERM CQ - DOSED IN MG/24 HOURS) 14 mg/24hr patch Place 1 patch (14 mg total) onto the skin daily. 05/27/22   Yes Cresenzo, Cyndi Lennert, MD  NIFEdipine (ADALAT CC) 30 MG 24 hr tablet Take 1 tablet (30 mg total) by mouth daily. 05/27/22  Yes Cresenzo, Cyndi Lennert, MD  furosemide (LASIX) 20 MG tablet Take 1 tablet (20 mg total) by mouth daily. 05/27/22   [name], MD      Allergies    Penicillins and Sulfa antibiotics    Review of Systems   Review of Systems  Physical Exam Updated Vital Signs BP (!) 141/82   Pulse 78   Temp 98 F (36.7 C) (Oral)   Resp 15   Ht 5\' 3"  (1.6 m)   Wt 90.7 kg   LMP 09/06/2021 (Exact Date)   SpO2 100%   Breastfeeding No   BMI 35.43 kg/m  Physical Exam Constitutional:      Comments: GCS 15.  Patient is alert but ill and uncomfortable in appearance.  No respiratory distress.  HENT:     Mouth/Throat:     Pharynx: Oropharynx is clear.  Eyes:     Extraocular Movements: Extraocular movements intact.  Cardiovascular:     Rate and Rhythm: Normal rate and regular rhythm.  Pulmonary:     Effort: Pulmonary effort is normal.     Breath sounds: Normal breath sounds.  Abdominal:     Comments: Abdomen soft.  Mild discomfort to lower abdomen.  No guarding.  Musculoskeletal:     Cervical back: Neck supple.  Comments: 2-3+ pitting edema of lower legs and feet bilaterally.  Skin:    General: Skin is warm and dry.     Coloration: Skin is pale.  Neurological:     General: No focal deficit present.     Mental Status: She is oriented to person, place, and time.     ED Results / Procedures / Treatments   Labs (all labs ordered are listed, but only abnormal results are displayed) Labs Reviewed  CBC WITH DIFFERENTIAL/PLATELET - Abnormal; Notable for the following components:      Result Value   WBC 14.7 (*)    RBC 3.18 (*)    Hemoglobin 10.1 (*)    HCT 29.4 (*)    Platelets 534 (*)    Neutro Abs 10.9 (*)    Abs Immature Granulocytes 0.19 (*)    All other components within normal limits  COMPREHENSIVE METABOLIC PANEL - Abnormal; Notable for the following  components:   Albumin 3.3 (*)    AST 12 (*)    Total Bilirubin 0.2 (*)    All other components within normal limits  URINALYSIS, ROUTINE W REFLEX MICROSCOPIC - Abnormal; Notable for the following components:   APPearance HAZY (*)    Hgb urine dipstick LARGE (*)    Protein, ur >300 (*)    Leukocytes,Ua LARGE (*)    Non Squamous Epithelial 0-5 (*)    All other components within normal limits  DIC (DISSEMINATED INTRAVASCULAR COAGULATION)PANEL - Abnormal; Notable for the following components:   Fibrinogen 575 (*)    D-Dimer, Quant 1.08 (*)    Platelets 537 (*)    All other components within normal limits  CULTURE, BLOOD (ROUTINE X 2)  CULTURE, BLOOD (ROUTINE X 2)  MAGNESIUM  LACTATE DEHYDROGENASE  LACTIC ACID, PLASMA    EKG EKG Interpretation  Date/Time:  Wednesday Jun 01 2022 08:42:59 EDT Ventricular Rate:  66 PR Interval:  144 QRS Duration: 97 QT Interval:  401 QTC Calculation: 421 R Axis:   52 Text Interpretation: Sinus rhythm normal, no sig change from previous Confirmed by Arby Barrette [id number] on 06/01/2022 9:21:45 AM  Radiology CT VENOGRAM HEAD  Result Date: 06/01/2022 CLINICAL DATA:  Dural venous sinus thrombosis suspected. Severe postpartum headache. Increased lower extremity swelling and edema since giving birth 1 week ago. EXAM: CT VENOGRAM HEAD TECHNIQUE: Venographic phase images of the brain were obtained following the administration of intravenous contrast. Multiplanar reformats and maximum intensity projections were generated. RADIATION DOSE REDUCTION: This exam was performed according to the departmental dose-optimization program which includes automated exposure control, adjustment of the mA and/or kV according to patient size and/or use of iterative reconstruction technique. CONTRAST:  75mL OMNIPAQUE IOHEXOL 350 MG/ML SOLN COMPARISON:  None Available. FINDINGS: No acute infarct, hemorrhage, or mass lesion is present. No significant white matter lesions are present.  Deep brain nuclei are within normal limits. The ventricles are of normal size. No significant extraaxial fluid collection is present. The brainstem and cerebellum are within normal limits. Midline structures are within normal limits. The paranasal sinuses and mastoid air cells are clear. The globes and orbits are within normal limits. Postcontrast images demonstrate normal opacification of the dural sinuses. The right transverse sinus is dominant. No filling defects are present to suggest thrombus. The cortical veins are within normal limits. The veins are within normal limits. IMPRESSION: 1. Normal CT venogram. No evidence for dural venous sinus thrombosis. 2. Normal CT appearance of the brain. Electronically Signed   By: Marin Roberts  M.D.   On: 06/01/2022 11:18    Procedures Procedures   CRITICAL CARE Performed by: Arby Barrette   Total critical care time: 45 minutes  Critical care time was exclusive of separately billable procedures and treating other patients.  Critical care was necessary to treat or prevent imminent or life-threatening deterioration.  Critical care was time spent personally by me on the following activities: development of treatment plan with patient and/or surrogate as well as nursing, discussions with consultants, evaluation of patient's response to treatment, examination of patient, obtaining history from patient or surrogate, ordering and performing treatments and interventions, ordering and review of laboratory studies, ordering and review of radiographic studies, pulse oximetry and re-evaluation of patient's condition.  Medications Ordered in ED Medications  magnesium sulfate 40 grams in SWI 1000 mL OB infusion (2 g/hr Intravenous New Bag/Given 06/01/22 1323)  lactated ringers infusion ( Intravenous New Bag/Given 06/01/22 1321)  labetalol (NORMODYNE) injection 20 mg (has no administration in time range)    And  labetalol (NORMODYNE) injection 40 mg (has no  administration in time range)    And  labetalol (NORMODYNE) injection 80 mg (has no administration in time range)    And  hydrALAZINE (APRESOLINE) injection 10 mg (has no administration in time range)  magnesium sulfate IVPB 2 g 50 mL (0 g Intravenous Stopped 06/01/22 1129)  iohexol (OMNIPAQUE) 350 MG/ML injection 100 mL (75 mLs Intravenous Contrast Given 06/01/22 1044)  magnesium sulfate IVPB 2 g 50 mL (0 g Intravenous Stopped 06/01/22 1326)  hydrALAZINE (APRESOLINE) injection 10 mg (10 mg Intravenous Given 06/01/22 1213)    ED Course/ Medical Decision Making/ A&P                             Medical Decision Making Amount and/or Complexity of Data Reviewed Labs: ordered. Radiology: ordered.  Risk Prescription drug management. Decision regarding hospitalization.  Patient presents as outlined postpartum day 8.  Patient reports persisting throbbing headache and large swelling of her feet and lower legs.  Patient has been hypertensive with nifedipine started postpartum.  Patient's morning blood pressure was 180/100 systolic.  At this time significant concern for preeclampsia.  Will add oral pertinent lab work and also concern for possible venous sinus thrombosis with severe generalized headache postpartum.  Will obtain CT head.  11: 45 2 g of magnesium have infused.  Patient reports feeling a little better in terms of her headache.  Systolic blood pressure is reading 158.  Will infuse 2 more grams of magnesium, hydralazine 10mg ,  and recheck.  Urine positive for greater than 300 mg/dL protein.  Urinalysis also red cells and white cells but contaminated specimen with 6-10 epithelial cells.  Patient does not have dysuria, fever, flank pain or signs of sepsis.  At this time suspect white and red cells is contaminant but will need close observation for signs of infection.  Lactic acid normal clinic,.  White count 14.7.  Platelets 534 LDH 172.  Magnesium 1.8.  CT scan head returned and interpreted by  radiology negative for acute findings and negative for venous sinus thrombosis.  Consult: Reviewed with Dr.Wouk.  Reports patient will need admission.  At this time also needs a continuous magnesium infusion after the first 4 g bolus and as needed blood pressure control with hydralazine or labetalol.  Patient rechecked.  Blood pressures have been trending down to 130s 140s systolic.  Mental status remains clear.  Patient does not have respiratory  distress.  Stable for transfer.        Final Clinical Impression(s) / ED Diagnoses Final diagnoses:  Preeclampsia in postpartum period    Rx / DC Orders ED Discharge Orders     None         Arby Barrette, MD 06/01/22 1515

## 2022-06-02 DIAGNOSIS — Z3A49 Greater than 42 weeks gestation of pregnancy: Secondary | ICD-10-CM

## 2022-06-02 DIAGNOSIS — O1415 Severe pre-eclampsia, complicating the puerperium: Secondary | ICD-10-CM | POA: Diagnosis not present

## 2022-06-02 MED ORDER — NIFEDIPINE ER 60 MG PO TB24: 60.0000 mg | ORAL_TABLET | Freq: Every day | ORAL | 2 refills | Status: DC

## 2022-06-02 MED ORDER — FUROSEMIDE 40 MG PO TABS: 40.0000 mg | ORAL_TABLET | Freq: Two times a day (BID) | ORAL | 0 refills | Status: DC

## 2022-06-02 NOTE — Progress Notes (Signed)
FACULTY PRACTICE ANTEPARTUM COMPREHENSIVE PROGRESS NOTE  April James is a 32 y.o. [phone] who is PPD7 s/p SVD re-admitted for postpartum preE w/ SF (BP, HA)  Length of Stay:  1 Days. Admitted 06/01/2022  Subjective: Feeling better this morning. Has an intermittent mild HA, but no visual changes, CP/SOB, RUQ epigastric pain. Swelling significantly improved.   Vitals:  Blood pressure 120/74, pulse 69, temperature 99.2 F (37.3 C), temperature source Oral, resp. rate 17, height 5\' 3"  (1.6 m), weight 90.7 kg, last menstrual period 09/06/2021, SpO2 100 %, not currently breastfeeding. Physical Examination: CONSTITUTIONAL: Well-developed, well-nourished female in no acute distress.  NEUROLOGIC: Alert and oriented to person, place, and time. CARDIOVASCULAR: Normal heart rate noted RESPIRATORY: Effort normal, no problems with respiration noted MUSCULOSKELETAL: 2+ pitting pedal edema.  ABDOMEN: Soft, nontender, nondistended.  Results for orders placed or performed during the hospital encounter of 06/01/22 (from the past 48 hour(s))  CBC with Differential     Status: Abnormal   Collection Time: 06/01/22  9:02 AM  Result Value Ref Range   WBC 14.7 (H) 4.0 - 10.5 K/uL   RBC 3.18 (L) 3.87 - 5.11 MIL/uL   Hemoglobin 10.1 (L) 12.0 - 15.0 g/dL   HCT 45.4 (L) 09.8 - 11.9 %   MCV 92.5 80.0 - 100.0 fL   MCH 31.8 26.0 - 34.0 pg   MCHC 34.4 30.0 - 36.0 g/dL   RDW 14.7 82.9 - 56.2 %   Platelets 534 (H) 150 - 400 K/uL   nRBC 0.0 0.0 - 0.2 %   Neutrophils Relative % 74 %   Neutro Abs 10.9 (H) 1.7 - 7.7 K/uL   Lymphocytes Relative 17 %   Lymphs Abs 2.5 0.7 - 4.0 K/uL   Monocytes Relative 4 %   Monocytes Absolute 0.7 0.1 - 1.0 K/uL   Eosinophils Relative 3 %   Eosinophils Absolute 0.4 0.0 - 0.5 K/uL   Basophils Relative 1 %   Basophils Absolute 0.1 0.0 - 0.1 K/uL   Immature Granulocytes 1 %   Abs Immature Granulocytes 0.19 (H) 0.00 - 0.07 K/uL    Comment: Performed at [facility],  [address] [place], [place] [address]  Comprehensive metabolic panel     Status: Abnormal   Collection Time: 06/01/22  9:02 AM  Result Value Ref Range   Sodium 138 135 - 145 mmol/L   Potassium 3.8 3.5 - 5.1 mmol/L   Chloride 105 98 - 111 mmol/L   CO2 24 22 - 32 mmol/L   Glucose, Bld 94 70 - 99 mg/dL    Comment: Glucose reference range applies only to samples taken after fasting for at least 8 hours.   BUN 17 6 - 20 mg/dL   Creatinine, Ser 5.78 0.44 - 1.00 mg/dL   Calcium 9.0 8.9 - 46.9 mg/dL   Total Protein 6.5 6.5 - 8.1 g/dL   Albumin 3.3 (L) 3.5 - 5.0 g/dL   AST 12 (L) 15 - 41 U/L   ALT 12 0 - 44 U/L   Alkaline Phosphatase 119 38 - 126 U/L   Total Bilirubin 0.2 (L) 0.3 - 1.2 mg/dL   GFR, Estimated >62 >95 mL/min    Comment: (NOTE) Calculated using the CKD-EPI Creatinine Equation (2021)    Anion gap 9 5 - 15    Comment: Performed at [facility], [address], [place], [place] [address]  Magnesium     Status: None   Collection Time: 06/01/22  9:02 AM  Result  Value Ref Range   Magnesium 1.8 1.7 - 2.4 mg/dL    Comment: Performed at [facility], [address], [place], [place] [address]  Lactate dehydrogenase     Status: None   Collection Time: 06/01/22  9:02 AM  Result Value Ref Range   LDH 172 98 - 192 U/L    Comment: Performed at [facility], [address] [place], [place] [address]  Urinalysis, Routine w reflex microscopic -Urine, Clean Catch     Status: Abnormal   Collection Time: 06/01/22  9:07 AM  Result Value Ref Range   Color, Urine YELLOW YELLOW   APPearance HAZY (A) CLEAR   Specific Gravity, Urine 1.024 1.005 - 1.030   pH 6.0 5.0 - 8.0   Glucose, UA NEGATIVE NEGATIVE mg/dL   Hgb urine dipstick LARGE (A) NEGATIVE   Bilirubin Urine NEGATIVE NEGATIVE   Ketones, ur NEGATIVE NEGATIVE mg/dL   Protein, ur >696 (A) NEGATIVE mg/dL   Nitrite NEGATIVE NEGATIVE   Leukocytes,Ua LARGE (A) NEGATIVE    RBC / HPF >50 0 - 5 RBC/hpf   WBC, UA >50 0 - 5 WBC/hpf   Bacteria, UA NONE SEEN NONE SEEN   Squamous Epithelial / HPF 6-10 0 - 5 /HPF   WBC Clumps PRESENT    Mucus PRESENT    Non Squamous Epithelial 0-5 (A) NONE SEEN    Comment: Performed at [facility], [address] [place], [place], [place] [address]  DIC Panel Once     Status: Abnormal   Collection Time: 06/01/22  9:08 AM  Result Value Ref Range   Prothrombin Time 12.0 11.4 - 15.2 seconds    Comment: Performed at [facility], [address], [place], [place] [address]   INR 0.9 0.8 - 1.2    Comment: (NOTE) INR goal varies based on device and disease states. Performed at [facility], [address] [place], [place] [address]    aPTT 35 24 - 36 seconds    Comment: Performed at [facility], [address], [place], [place] [address]   Fibrinogen 575 (H) 210 - 475 mg/dL    Comment: (NOTE) Fibrinogen results may be underestimated in patients receiving thrombolytic therapy. Performed at [facility], [address], [place], [place] [address]    D-Dimer, Quant 1.08 (H) 0.00 - 0.50 ug/mL-FEU    Comment: (NOTE) At the manufacturer cut-off value of 0.5 g/mL FEU, this assay has a negative predictive value of 95-100%.This assay is intended for use in conjunction with a clinical pretest probability (PTP) assessment model to exclude pulmonary embolism (PE) and deep venous thrombosis (DVT) in outpatients suspected of PE or DVT. Results should be correlated with clinical presentation.    Platelets 537 (H) 150 - 400 K/uL   Smear Review NO SCHISTOCYTES SEEN     Comment: Performed at [facility], [address], [place], [place] [address]  Lactic acid, plasma     Status: None   Collection Time: 06/01/22 11:39 AM  Result Value Ref Range   Lactic Acid, Venous 0.5 0.5 - 1.9 mmol/L    Comment: Performed at [facility], [address] [place], [place] [address]    CT VENOGRAM HEAD  Result Date: 06/01/2022 CLINICAL DATA:  Dural venous sinus thrombosis suspected. Severe postpartum headache. Increased lower extremity swelling and edema since giving birth 1 week ago. EXAM: CT VENOGRAM HEAD TECHNIQUE: Venographic phase images of the brain were obtained following the administration of  intravenous contrast. Multiplanar reformats and maximum intensity projections were generated. RADIATION DOSE REDUCTION: This exam was performed according to the departmental dose-optimization program which includes automated exposure control, adjustment of the mA and/or kV according to patient size and/or use of iterative reconstruction technique. CONTRAST:  75mL OMNIPAQUE IOHEXOL 350 MG/ML SOLN COMPARISON:  None Available. FINDINGS: No acute infarct, hemorrhage, or mass lesion is present. No significant white matter lesions are present. Deep brain nuclei are within normal limits. The ventricles are of normal size. No significant extraaxial fluid collection is present. The brainstem and cerebellum are within normal limits. Midline structures are within normal limits. The paranasal sinuses and mastoid air cells are clear. The globes and orbits are within normal limits. Postcontrast images demonstrate normal opacification of the dural sinuses. The right transverse sinus is dominant. No filling defects are present to suggest thrombus. The cortical veins are within normal limits. The veins are within normal limits. IMPRESSION: 1. Normal CT venogram. No evidence for dural venous sinus thrombosis. 2. Normal CT appearance of the brain. Electronically Signed   By: Marin Roberts M.D.   On: 06/01/2022 11:18    Current scheduled medications  enoxaparin (LOVENOX) injection  40 mg Subcutaneous Q24H   furosemide  40 mg Oral BID   ibuprofen  600 mg Oral Q6H   nicotine  14 mg Transdermal Daily   NIFEdipine  60 mg Oral Daily   prenatal  multivitamin  1 tablet Oral Q1200   senna-docusate  2 tablet Oral Daily   Tdap  0.5 mL Intramuscular Once    I have reviewed the patient's current medications.  ASSESSMENT: Principal Problem:   Preeclampsia, severe Active Problems:   Pre-eclampsia, severe, postpartum condition Clinically improving. Headache improved & blood pressures well controlled.   PLAN: Magnesium x 24h Continue procardia 60 daily & lasix 40 BID  Potential discharge home today, will f/u BP off mag  Harvie Bridge, MD Obstetrician & Gynecologist, [facility] for [employer], [facility]

## 2022-06-02 NOTE — Discharge Summary (Signed)
Physician Discharge Summary  Patient ID: [place] Carreon MRN: [id number] DOB/AGE: 08-27-1990 32 y.o.  Admit date: 06/01/2022 Discharge date: 06/02/2022  Admission Diagnoses: Postpartum pre eclampsia  Discharge Diagnoses:  Principal Problem:   Preeclampsia, severe Active Problems:   Pre-eclampsia, severe, postpartum condition   Discharged Condition: stable  Hospital Course: admitted with concern for postpartum pre eclampsia with severe features.  Underwent magnesium sulfate prophylaxis  Consults: None  Significant Diagnostic Studies: labs:  and radiology: CT head normal  Treatments: ^procardia to 60 qd + ^lasix 40 BID + MgSO4  Discharge Exam: Blood pressure 128/87, pulse 72, temperature 98.3 F (36.8 C), temperature source Oral, resp. rate 17, height 5\' 3"  (1.6 m), weight 90.7 kg, last menstrual period 09/06/2021, SpO2 98 %, not currently breastfeeding. General appearance: alert, cooperative, and no distress GI: soft, non-tender; bowel sounds normal; no masses,  no organomegaly Disposition: Discharge disposition: 01-Home or Self Care       Discharge Instructions     Activity as tolerated   Complete by: As directed    Diet - low sodium heart healthy   Complete by: As directed    No wound care   Complete by: As directed    Sexual activity   Complete by: As directed    No sex for 6 weeks      Allergies as of 06/02/2022       Reactions   Penicillins Hives, Shortness Of Breath   Sulfa Antibiotics Rash        Medication List     TAKE these medications    acetaminophen 325 MG tablet Commonly known as: Tylenol Take 2 tablets (650 mg total) by mouth every 4 (four) hours as needed (for pain scale < 4).   furosemide 40 MG tablet Commonly known as: LASIX Take 1 tablet (40 mg total) by mouth 2 (two) times daily. What changed:  medication strength how much to take when to take this   hydrOXYzine 25 MG capsule Commonly known as: Vistaril Take 1 capsule (25 mg  total) by mouth 3 (three) times daily as needed. What changed: reasons to take this   ibuprofen 600 MG tablet Commonly known as: ADVIL Take 1 tablet (600 mg total) by mouth every 6 (six) hours.   nicotine 14 mg/24hr patch Commonly known as: NICODERM CQ - dosed in mg/24 hours Place 1 patch (14 mg total) onto the skin daily.   NIFEdipine 60 MG 24 hr tablet Commonly known as: ADALAT CC Take 1 tablet (60 mg total) by mouth daily. Start taking on: Jun 03, 2022 What changed:  medication strength how much to take        Follow-up Information     [facility] Follow up on 06/06/2022.   Specialty: Obstetrics and Gynecology Why: BP check Contact information: [address] [place] [address] [phone]                Signed: Lazaro Arms 06/02/2022, 5:48 PM

## 2022-06-02 NOTE — Progress Notes (Signed)
Discharge instructions and prescriptions given to pt. Discussed signs and symptoms to report to the MD, upcoming appointments, and meds. Pt verbalizes understanding and has no questions or concerns at this time. Pt discharged  home from hospital in stable condition. 

## 2022-06-03 LAB — CULTURE, BLOOD (ROUTINE X 2)
Culture: NO GROWTH
Special Requests: ADEQUATE
Special Requests: ADEQUATE

## 2022-06-04 ENCOUNTER — Telehealth (HOSPITAL_COMMUNITY): Payer: Self-pay

## 2022-06-04 LAB — CULTURE, BLOOD (ROUTINE X 2)

## 2022-06-04 NOTE — Telephone Encounter (Signed)
Patient reports feeling good. Patient declines questions/concerns about her health and healing.  Patient reports that baby is doing well. Eating, peeing/pooping, and gaining weight well. Baby sleeps in a bassinet. RN reviewed ABC's of safe sleep with patient. Patient declines any questions or concerns about baby.  EPDS score is 5.  Suann Larry [facility]  Perinatal Services   06/04/22,1540

## 2022-06-06 ENCOUNTER — Other Ambulatory Visit (HOSPITAL_BASED_OUTPATIENT_CLINIC_OR_DEPARTMENT_OTHER): Payer: Self-pay

## 2022-06-06 LAB — CULTURE, BLOOD (ROUTINE X 2): Culture: NO GROWTH

## 2022-06-07 ENCOUNTER — Ambulatory Visit (INDEPENDENT_AMBULATORY_CARE_PROVIDER_SITE_OTHER): Payer: Medicaid Other

## 2022-06-07 VITALS — BP 126/85 | HR 86

## 2022-06-07 DIAGNOSIS — O1415 Severe pre-eclampsia, complicating the puerperium: Secondary | ICD-10-CM

## 2022-06-07 NOTE — Progress Notes (Signed)
Subjective:  April James is a 32 y.o. female here for BP check.   Hypertension ROS: Patient notes headaches, no visual symptoms, RUQ/epigastric pain or other concerning symptoms.  Objective:  LMP 09/06/2021 (Exact Date)   Appearance alert, well appearing, and in no distress. General exam BP noted to be stable today in office.    Assessment:   Blood Pressure stable. Discussed measures to take for HA's   Plan:  Keep PP if no relief from HA with Tylenol or Motrin patient advised to contact the office .

## 2022-06-09 ENCOUNTER — Encounter: Payer: Self-pay | Admitting: Obstetrics & Gynecology

## 2022-06-16 NOTE — Discharge Summary (Signed)
See previous discharge summary

## 2022-07-05 ENCOUNTER — Ambulatory Visit: Payer: Medicaid Other | Admitting: Obstetrics & Gynecology

## 2023-03-09 ENCOUNTER — Ambulatory Visit: Payer: Medicaid Other | Admitting: Obstetrics & Gynecology

## 2023-03-09 ENCOUNTER — Other Ambulatory Visit (HOSPITAL_COMMUNITY)
Admission: RE | Admit: 2023-03-09 | Discharge: 2023-03-09 | Disposition: A | Discharge status: Home / Self Care | Payer: Medicaid Other | Source: Ambulatory Visit | Attending: Obstetrics & Gynecology | Admitting: Obstetrics & Gynecology

## 2023-03-09 ENCOUNTER — Encounter: Payer: Self-pay | Admitting: Obstetrics & Gynecology

## 2023-03-09 ENCOUNTER — Other Ambulatory Visit: Payer: Self-pay

## 2023-03-09 VITALS — BP 127/81 | HR 85 | Wt 227.0 lb

## 2023-03-09 DIAGNOSIS — F419 Anxiety disorder, unspecified: Secondary | ICD-10-CM

## 2023-03-09 DIAGNOSIS — O99212 Obesity complicating pregnancy, second trimester: Secondary | ICD-10-CM | POA: Diagnosis not present

## 2023-03-09 DIAGNOSIS — O0990 Supervision of high risk pregnancy, unspecified, unspecified trimester: Secondary | ICD-10-CM

## 2023-03-09 DIAGNOSIS — O99330 Smoking (tobacco) complicating pregnancy, unspecified trimester: Secondary | ICD-10-CM

## 2023-03-09 DIAGNOSIS — O0992 Supervision of high risk pregnancy, unspecified, second trimester: Secondary | ICD-10-CM | POA: Diagnosis not present

## 2023-03-09 DIAGNOSIS — O99342 Other mental disorders complicating pregnancy, second trimester: Secondary | ICD-10-CM

## 2023-03-09 DIAGNOSIS — O09299 Supervision of pregnancy with other poor reproductive or obstetric history, unspecified trimester: Secondary | ICD-10-CM

## 2023-03-09 DIAGNOSIS — O99210 Obesity complicating pregnancy, unspecified trimester: Secondary | ICD-10-CM | POA: Diagnosis not present

## 2023-03-09 DIAGNOSIS — O99332 Smoking (tobacco) complicating pregnancy, second trimester: Secondary | ICD-10-CM

## 2023-03-09 DIAGNOSIS — Z1339 Encounter for screening examination for other mental health and behavioral disorders: Secondary | ICD-10-CM | POA: Diagnosis not present

## 2023-03-09 DIAGNOSIS — O09292 Supervision of pregnancy with other poor reproductive or obstetric history, second trimester: Secondary | ICD-10-CM | POA: Diagnosis not present

## 2023-03-09 DIAGNOSIS — Z3A21 21 weeks gestation of pregnancy: Secondary | ICD-10-CM | POA: Diagnosis not present

## 2023-03-09 DIAGNOSIS — Z8759 Personal history of other complications of pregnancy, childbirth and the puerperium: Secondary | ICD-10-CM

## 2023-03-09 MED ORDER — ASPIRIN 81 MG PO TBEC
162.0000 mg | DELAYED_RELEASE_TABLET | Freq: Every day | ORAL | 2 refills | Status: DC
Start: 2023-03-09 — End: 2023-06-29

## 2023-03-09 NOTE — Progress Notes (Signed)
 Pt had a normal cycle in September, and then had very light cycles in Oct and Nov and then no bleeding in December, had a positive UPT in December.     Patient informed that the ultrasound is considered a limited obstetric ultrasound and is not intended to be a complete ultrasound exam.  Patient also informed that the ultrasound is not being completed with the intent of assessing for fetal or placental anomalies or any pelvic abnormalities. Explained that the purpose of today's ultrasound is to assess for fetal heart rate.  Patient acknowledges the purpose of the exam and the limitations of the study.        Wanda Buckles, RN

## 2023-03-09 NOTE — Progress Notes (Signed)
 History:   April James is a 33 y.o. [id number] at [redacted]w[redacted]d by unsure LMP being seen today for her first obstetrical visit.  Her obstetrical history is significant for  having IUGR and severe preeclampsia last pregnancy . Also history of morbid obesity, Bipolar 1 disorder and Anxiety.  Pregnancy history fully reviewed.  Patient reports no complaints.  Anxiety is improved compared to last pregnancy, currently on Vistaril .     HISTORY: OB History  Gravida Para Term Preterm AB Living  5 2 2  0 2 2  SAB IAB Ectopic Multiple Live Births  1 1 0 0 2    # Outcome Date GA Lbr Len/2nd Weight Sex Type Anes PTL Lv  5 Current           4 Term 05/25/22 [redacted]w[redacted]d 09:48 / 00:12 4 lb 14.7 oz (2.23 kg) M Vag-Spont EPI  LIV     Name: Faciane,BOY Darlin     Apgar1: 8  Apgar5: 9  3 SAB 2023          2 IAB 2015          1 Term 12/20/08    M Vag-Spont   LIV  Last pap smear was done 01/13/2022 and was normal  Past Medical History:  Diagnosis Date   Asthma    Bipolar 1 disorder (HCC)    Chlamydia infection affecting pregnancy in second trimester 01/19/2022   Toc neg early feb 2024   Preeclampsia, severe 06/01/2022   Past Surgical History:  Procedure Laterality Date   CHOLECYSTECTOMY     TONSILLECTOMY     Family History  Problem Relation Age of Onset   Bipolar disorder Father    Heart disease Father    Aneurysm Father    Asthma Son    Diabetes Maternal Grandfather    Diabetes Paternal Grandmother    Cancer Paternal Grandmother    Hypertension Paternal Grandfather    Stroke Neg Hx    Social History   Tobacco Use   Smoking status: Former    Current packs/day: 0.20    Average packs/day: 0.2 packs/day for 16.0 years (3.2 ttl pk-yrs)    Types: Cigarettes    Passive exposure: Current   Smokeless tobacco: Never  Vaping Use   Vaping status: Some Days  Substance Use Topics   Alcohol use: Not Currently    Comment: stopped for now   Drug use: Not Currently    Types: Marijuana    Comment: Not  very often. Last used: 2 months ago.    Allergies  Allergen Reactions   Penicillins Hives and Shortness Of Breath   Sulfa Antibiotics Rash   Current Outpatient Medications on File Prior to Visit  Medication Sig Dispense Refill   hydrOXYzine  (VISTARIL ) 25 MG capsule Take 1 capsule (25 mg total) by mouth 3 (three) times daily as needed. (Patient taking differently: Take 25 mg by mouth 3 (three) times daily as needed for anxiety.) 90 capsule 11   Prenatal Vit-Fe Fumarate-FA (MULTIVITAMIN-PRENATAL) 27-0.8 MG TABS tablet Take 1 tablet by mouth daily at 12 noon.     No current facility-administered medications on file prior to visit.   Review of Systems Pertinent items noted in HPI and remainder of comprehensive ROS otherwise negative.   Physical Exam:   Vitals:   03/09/23 1029  BP: 127/81  Pulse: 85  Weight: 227 lb (103 kg)   Fetal Heart Rate (bpm): 152   General: well-developed, well-nourished female in no acute distress  Breasts:  deferred  Skin: normal coloration and turgor, no rashes  Neurologic: oriented, normal, negative, normal mood  Extremities: normal strength, tone, and muscle mass, ROM of all joints is normal  HEENT PERRLA, extraocular movement intact and sclera clear, anicteric  Neck supple and no masses  Cardiovascular: regular rate and rhythm  Respiratory:  no respiratory distress, normal breath sounds  Abdomen: soft, non-tender; bowel sounds normal; no masses,  no organomegaly  Pelvic: deferred    Assessment:    Pregnancy: [id number] Patient Active Problem List   Diagnosis Date Noted   History of severe preeclampsia in prior pregnancy, currently pregnant 03/09/2023   History of prior pregnancy with IUGR 03/09/2023   Anxiety during pregnancy 03/09/2023   Maternal morbid obesity, antepartum (HCC) 03/08/2022   Bipolar disease in pregnancy in third trimester (HCC) 03/08/2022   Supervision of high risk pregnancy, antepartum 01/13/2022   Tobacco use affecting  pregnancy, antepartum 01/13/2022   Alcohol use disorder, mild, abuse 05/03/2015    Plan:    1. History of severe preeclampsia in prior pregnancy, currently pregnant Aspirin  162 mg po daily ordered, she has BP cuff.  Surveillance labs checked, referred to Cardio Obstetrics. - Protein / creatinine ratio, urine - Comprehensive metabolic panel - US  MFM OB DETAIL +14 WK; Future - AMB Referral to Cardio Obstetrics - aspirin  EC 81 MG tablet; Take 2 tablets (162 mg total) by mouth at bedtime. Start taking when you are [redacted] weeks pregnant for rest of pregnancy for prevention of preeclampsia  Dispense: 300 tablet; Refill: 2  2. History of prior pregnancy with IUGR - US  MFM OB DETAIL +14 WK; Future  3. Maternal morbid obesity, antepartum (HCC) - Hemoglobin A1c - Protein / creatinine ratio, urine - Comprehensive metabolic panel - TSH Rfx on Abnormal to Free T4 - US  MFM OB DETAIL +14 WK; Future - AMB Referral to Cardio Obstetrics - aspirin  EC 81 MG tablet; Take 2 tablets (162 mg total) by mouth at bedtime. Start taking when you are [redacted] weeks pregnant for rest of pregnancy for prevention of preeclampsia  Dispense: 300 tablet; Refill: 2  4. Anxiety during pregnancy Stable on Vistaril   5. Tobacco use affecting pregnancy, antepartum Vaping, not interested in reduction.  6. [redacted] weeks gestation of pregnancy 7. Supervision of high risk pregnancy, antepartum (Primary) - CBC/D/Plt+RPR+Rh+ABO+RubIgG... - Culture, OB Urine - Cervicovaginal ancillary only - Hemoglobin A1c - Protein / creatinine ratio, urine - Comprehensive metabolic panel - TSH Rfx on Abnormal to Free T4 - US  MFM OB DETAIL +14 WK; Future - US  OB Limited; Future - PANORAMA PRENATAL TEST - AMB Referral to Cardio Obstetrics - aspirin  EC 81 MG tablet; Take 2 tablets (162 mg total) by mouth at bedtime. Start taking when you are [redacted] weeks pregnant for rest of pregnancy for prevention of preeclampsia  Dispense: 300 tablet; Refill:  2  Initial labs drawn. Continue prenatal vitamins. Problem list reviewed and updated. Genetic Screening discussed, Panorama: requested. Ultrasound discussed; fetal anatomic survey: scheduled. Anticipatory guidance about prenatal visits given including labs, ultrasounds, and testing. Weight gain recommendations per IOM guidelines reviewed: underweight/BMI 18.5 or less > 28 - 40 lbs; normal weight/BMI 18.5 - 24.9 > 25 - 35 lbs; overweight/BMI 25 - 29.9 > 15 - 25 lbs; obese/BMI  30 or more > 11 - 20 lbs. Discussed usage of the Babys[employer]s app for more information about pregnancy, and to track blood pressures. Also discussed usage of virtual visits as additional source of managing and completing prenatal visits.  Patient was encouraged to  use MyChart to review results, send requests, and have questions addressed.   The nature of [facility] with multiple MDs and Advanced Practice Providers was explained to patient; also emphasized that residents, students are part of our team. Routine obstetric precautions reviewed. Encouraged to seek out care at our office or emergency room [facility] preferred) for urgent and/or emergent concerns. Return in about 4 weeks (around 04/06/2023) for OFFICE OB VISIT (MD only).     GLORIS HUGGER, MD, FACOG Obstetrician & Gynecologist, [facility] for [employer], [facility]

## 2023-03-10 ENCOUNTER — Encounter: Payer: Self-pay | Admitting: Obstetrics & Gynecology

## 2023-03-10 LAB — CERVICOVAGINAL ANCILLARY ONLY
Chlamydia: NEGATIVE
Comment: NEGATIVE
Comment: NEGATIVE
Comment: NORMAL
Neisseria Gonorrhea: NEGATIVE
Trichomonas: NEGATIVE

## 2023-03-10 LAB — CBC/D/PLT+RPR+RH+ABO+RUBIGG...
Antibody Screen: NEGATIVE
Basophils Absolute: 0.1 10*3/uL (ref 0.0–0.2)
Basos: 1 %
EOS (ABSOLUTE): 0.2 10*3/uL (ref 0.0–0.4)
Eos: 2 %
HCV Ab: NONREACTIVE
HIV Screen 4th Generation wRfx: NONREACTIVE
Hematocrit: 38.4 % (ref 34.0–46.6)
Hemoglobin: 12.9 g/dL (ref 11.1–15.9)
Hepatitis B Surface Ag: NEGATIVE
Immature Grans (Abs): 0.1 10*3/uL (ref 0.0–0.1)
Immature Granulocytes: 1 %
Lymphocytes Absolute: 2.2 10*3/uL (ref 0.7–3.1)
Lymphs: 17 %
MCH: 31.2 pg (ref 26.6–33.0)
MCHC: 33.6 g/dL (ref 31.5–35.7)
MCV: 93 fL (ref 79–97)
Monocytes Absolute: 0.6 10*3/uL (ref 0.1–0.9)
Monocytes: 4 %
Neutrophils Absolute: 10 10*3/uL — ABNORMAL HIGH (ref 1.4–7.0)
Neutrophils: 75 %
Platelets: 411 10*3/uL (ref 150–450)
RBC: 4.14 x10E6/uL (ref 3.77–5.28)
RDW: 13.2 % (ref 11.7–15.4)
RPR Ser Ql: NONREACTIVE
Rh Factor: POSITIVE
Rubella Antibodies, IGG: 1.82 {index} (ref >0.99)
WBC: 13.2 10*3/uL — ABNORMAL HIGH (ref 3.4–10.8)

## 2023-03-10 LAB — COMPREHENSIVE METABOLIC PANEL
ALT: 7 [IU]/L (ref 0–32)
AST: 11 [IU]/L (ref 0–40)
Albumin: 3.8 g/dL — ABNORMAL LOW (ref 3.9–4.9)
Alkaline Phosphatase: 136 [IU]/L — ABNORMAL HIGH (ref 44–121)
BUN/Creatinine Ratio: 15 (ref 9–23)
BUN: 9 mg/dL (ref 6–20)
Bilirubin Total: <0.2 mg/dL (ref 0.0–1.2)
CO2: 20 mmol/L (ref 20–29)
Calcium: 9.4 mg/dL (ref 8.7–10.2)
Chloride: 100 mmol/L (ref 96–106)
Creatinine, Ser: 0.6 mg/dL (ref 0.57–1.00)
Globulin, Total: 2.7 g/dL (ref 1.5–4.5)
Glucose: 85 mg/dL (ref 70–99)
Potassium: 4.6 mmol/L (ref 3.5–5.2)
Sodium: 136 mmol/L (ref 134–144)
Total Protein: 6.5 g/dL (ref 6.0–8.5)
eGFR: 122 mL/min/{1.73_m2} (ref >59)

## 2023-03-10 LAB — HEMOGLOBIN A1C
Est. average glucose Bld gHb Est-mCnc: 105 mg/dL
Hgb A1c MFr Bld: 5.3 % (ref 4.8–5.6)

## 2023-03-10 LAB — PROTEIN / CREATININE RATIO, URINE
Creatinine, Urine: 159.9 mg/dL
Protein, Ur: 69.6 mg/dL
Protein/Creat Ratio: 435 mg/g{creat} — ABNORMAL HIGH (ref 0–200)

## 2023-03-10 LAB — TSH RFX ON ABNORMAL TO FREE T4: TSH: 0.967 u[IU]/mL (ref 0.450–4.500)

## 2023-03-10 LAB — HCV INTERPRETATION

## 2023-03-11 LAB — CULTURE, OB URINE

## 2023-03-11 LAB — URINE CULTURE, OB REFLEX

## 2023-03-13 ENCOUNTER — Encounter: Payer: Self-pay | Admitting: Obstetrics & Gynecology

## 2023-03-13 DIAGNOSIS — O1210 Gestational proteinuria, unspecified trimester: Secondary | ICD-10-CM | POA: Insufficient documentation

## 2023-03-17 ENCOUNTER — Encounter: Payer: Self-pay | Admitting: Obstetrics & Gynecology

## 2023-03-17 LAB — PANORAMA PRENATAL TEST FULL PANEL:PANORAMA TEST PLUS 5 ADDITIONAL MICRODELETIONS: FETAL FRACTION: 7.6
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
## 2023-04-03 ENCOUNTER — Ambulatory Visit: Payer: Medicaid Other | Admitting: Obstetrics and Gynecology

## 2023-04-03 ENCOUNTER — Ambulatory Visit: Payer: Medicaid Other | Attending: Obstetrics & Gynecology

## 2023-04-03 ENCOUNTER — Ambulatory Visit: Payer: Medicaid Other | Admitting: *Deleted

## 2023-04-03 ENCOUNTER — Other Ambulatory Visit: Payer: Self-pay | Admitting: *Deleted

## 2023-04-03 ENCOUNTER — Encounter: Payer: Self-pay | Admitting: *Deleted

## 2023-04-03 ENCOUNTER — Other Ambulatory Visit: Payer: Self-pay

## 2023-04-03 VITALS — BP 122/57 | HR 66

## 2023-04-03 DIAGNOSIS — O99210 Obesity complicating pregnancy, unspecified trimester: Secondary | ICD-10-CM

## 2023-04-03 DIAGNOSIS — Z362 Encounter for other antenatal screening follow-up: Secondary | ICD-10-CM

## 2023-04-03 DIAGNOSIS — F17210 Nicotine dependence, cigarettes, uncomplicated: Secondary | ICD-10-CM

## 2023-04-03 DIAGNOSIS — O0990 Supervision of high risk pregnancy, unspecified, unspecified trimester: Secondary | ICD-10-CM

## 2023-04-03 DIAGNOSIS — O09299 Supervision of pregnancy with other poor reproductive or obstetric history, unspecified trimester: Secondary | ICD-10-CM | POA: Diagnosis not present

## 2023-04-03 DIAGNOSIS — O09292 Supervision of pregnancy with other poor reproductive or obstetric history, second trimester: Secondary | ICD-10-CM | POA: Diagnosis not present

## 2023-04-03 DIAGNOSIS — O99340 Other mental disorders complicating pregnancy, unspecified trimester: Secondary | ICD-10-CM | POA: Insufficient documentation

## 2023-04-03 DIAGNOSIS — Z8759 Personal history of other complications of pregnancy, childbirth and the puerperium: Secondary | ICD-10-CM

## 2023-04-03 DIAGNOSIS — O4402 Placenta previa specified as without hemorrhage, second trimester: Secondary | ICD-10-CM | POA: Diagnosis not present

## 2023-04-03 DIAGNOSIS — O99212 Obesity complicating pregnancy, second trimester: Secondary | ICD-10-CM

## 2023-04-03 DIAGNOSIS — O99332 Smoking (tobacco) complicating pregnancy, second trimester: Secondary | ICD-10-CM | POA: Insufficient documentation

## 2023-04-03 DIAGNOSIS — F419 Anxiety disorder, unspecified: Secondary | ICD-10-CM

## 2023-04-03 DIAGNOSIS — O1210 Gestational proteinuria, unspecified trimester: Secondary | ICD-10-CM

## 2023-04-03 DIAGNOSIS — Z3A23 23 weeks gestation of pregnancy: Secondary | ICD-10-CM | POA: Insufficient documentation

## 2023-04-03 DIAGNOSIS — O09892 Supervision of other high risk pregnancies, second trimester: Secondary | ICD-10-CM

## 2023-04-03 DIAGNOSIS — Z3A21 21 weeks gestation of pregnancy: Secondary | ICD-10-CM

## 2023-04-03 DIAGNOSIS — E6689 Other obesity not elsewhere classified: Secondary | ICD-10-CM | POA: Insufficient documentation

## 2023-04-03 NOTE — Progress Notes (Signed)
 Maternal-Fetal Medicine Consultation I had the pleasure of [facility]. She is G5 P2022 at 25-weeks' gestation (by unsure LMP) and is here for fetal anatomy scan.  Her high risk pregnancy problems include -Short interval between pregnancies. -History of fetal growth restriction in the second pregnancy. -History of preeclampsia in her second pregnancy. -Pregravid BMI 40.  Obstetric history -2010: Term vaginal delivery of a female infant weighing 7 pounds and 13 ounces at birth. -2024: Term vaginal delivery of her female infant weighing 4 pounds and 15 ounces at birth.  Her pregnancy was complicated by fetal growth restriction.  The newborn was discharged with his mother. Both her children are in good health  Prenatal: Her pregnancy is dated by female length measurement (16 weeks).  No other biometry was performed.  On cell-free fetal DNA screening, the risks of fetal aneuploidies are not increased. Patient gives history of vaping in this pregnancy.   Ultrasound On today's ultrasound, fetal biometry is consistent with 23 weeks and 2 days gestation.  Amniotic fluid is normal and good fetal activity seen.  No markers of aneuploidies or obvious fetal structural defects are seen. Placenta previa is seen.  Our concerns include: Pregnancy dating Her pregnancy was suboptimally dated by femoral length measurement.  Complete biometry was not performed.  I counseled the patient that we should date her pregnancy by today's ultrasound measurements. We have assigned her EDD at 07/29/2023.  History of fetal growth restriction.   Recurrence of fetal growth restriction is seen in 20% to 30% of pregnancies.  We recommend serial fetal growth assessments to rule out fetal growth restriction.  History of preeclampsia She is pregnant with the same father.  Recurrence of preeclampsia seen and 25% to 50% of pregnancies.  Patient takes low-dose aspirin  prophylaxis.  I discussed the benefit of low-dose aspirin prophylaxis that delays or prevents preeclampsia.  Placenta previa Plan the findings with the help of ultrasound images.  In some cases, placenta previa resolves with advancing gestation.  Patient does not give history of vaginal bleeding. I counseled the patient that if placenta previa persists, we will recommend delivery at [redacted] weeks gestation or at [redacted] weeks gestation with bleeding. I advised her to practice abstinence from sexual intercourse. I did not address vaping today.  Recommendations -An appointment was made for her to return in 4 weeks for fetal growth assessment. -Fetal growth assessments every 4 weeks. -Weekly BPP from [redacted] weeks gestation till delivery (BMI>40).   Consultation including face-to-face (more than 50%) counseling 30 minutes.

## 2023-04-04 ENCOUNTER — Encounter: Payer: Self-pay | Admitting: Obstetrics & Gynecology

## 2023-04-04 ENCOUNTER — Ambulatory Visit (INDEPENDENT_AMBULATORY_CARE_PROVIDER_SITE_OTHER): Payer: Medicaid Other | Admitting: Obstetrics & Gynecology

## 2023-04-04 VITALS — BP 131/82 | HR 81 | Wt 230.0 lb

## 2023-04-04 DIAGNOSIS — O99210 Obesity complicating pregnancy, unspecified trimester: Secondary | ICD-10-CM

## 2023-04-04 DIAGNOSIS — O09299 Supervision of pregnancy with other poor reproductive or obstetric history, unspecified trimester: Secondary | ICD-10-CM

## 2023-04-04 DIAGNOSIS — Z8759 Personal history of other complications of pregnancy, childbirth and the puerperium: Secondary | ICD-10-CM

## 2023-04-04 DIAGNOSIS — O0990 Supervision of high risk pregnancy, unspecified, unspecified trimester: Secondary | ICD-10-CM | POA: Diagnosis not present

## 2023-04-04 DIAGNOSIS — Z3A23 23 weeks gestation of pregnancy: Secondary | ICD-10-CM | POA: Diagnosis not present

## 2023-04-04 DIAGNOSIS — O4403 Placenta previa specified as without hemorrhage, third trimester: Secondary | ICD-10-CM | POA: Insufficient documentation

## 2023-04-04 DIAGNOSIS — O4402 Placenta previa specified as without hemorrhage, second trimester: Secondary | ICD-10-CM | POA: Insufficient documentation

## 2023-04-04 NOTE — Patient Instructions (Signed)
 Oral Glucose Tolerance Test During Pregnancy Why am I having this test? The oral glucose tolerance test (GTT) is done to check how your body processes blood sugar (glucose). This is one of several tests used to diagnose diabetes that develops during pregnancy (gestational diabetes mellitus). Gestational diabetes is a short-term form of diabetes that some women develop while they are pregnant. It usually occurs during the second or third trimester of pregnancy and goes away after delivery. Testing, or screening, for gestational diabetes usually occurs around 69 of pregnancy. This test may also be needed earlier if: You have a history of gestational diabetes. There is a history of giving birth to very large babies or of losing pregnancies (having stillbirths). You have signs and symptoms of diabetes, such as: Changes in your eyesight. Tingling or numbness in your hands or feet. Changes in hunger, thirst, and urination, and these are not explained by your pregnancy. What is being tested? This test measures the amount of glucose in your blood at different times during a period of 2 hours. This shows how well your body can process glucose.  You will have three separate blood draws. What kind of sample is taken?  Blood samples are required for this test. They are usually collected by inserting a needle into a blood vessel. How do I prepare for this test? For 3 days before your test, eat normally. Have plenty of carbohydrate-rich foods. You will be asked not to eat or drink anything other than water (to fast) starting 8-10 hours before the test. Tell a health care provider about: All medicines you are taking, including vitamins, herbs, eye drops, creams, and over-the-counter medicines. Any blood disorders you have. Any surgeries you have had. Any medical conditions you have. What happens during the test? First, your blood glucose will be measured. This is referred to as your fasting blood glucose  because you fasted before the test. Then, you will drink a glucose solution that contains a certain amount of glucose. Your blood glucose will be measured again 1 and 2 hours after you drink the solution. This test takes about 2 hours to complete. You will need to stay at the testing location during this time. During the testing period: Do not eat or drink anything other than the glucose solution. Do not exercise. Do not use any products that contain nicotine or tobacco, such as cigarettes, e-cigarettes, and chewing tobacco. These can affect your test results. If you need help quitting, ask your health care provider. The testing procedure may vary among health care providers and hospitals. How are the results reported? Your results will be reported as milligrams of glucose per deciliter of blood (mg/dL) or millimoles per liter (mmol/L). There is more than one source for screening and diagnosis reference values used to diagnose gestational diabetes. Your health care provider will compare your results to normal values that were established after testing a large group of people (reference values). Reference values may vary among labs and hospitals. For this test, reference values are: Fasting: 92 mg/dL 1 hour: 161 mg/dL  2 hour: 096 mg/dL   What do the results mean? Results below the reference values are considered normal. If one or more of your blood glucose levels are at or above the reference values, you will be diagnosed with gestational diabetes.  Talk with your health care provider about what your results mean. Questions to ask your health care provider Ask your health care provider, or the department that is doing the test: When  will my results be ready? How will I get my results? What are my treatment options? What other tests do I need? What are my next steps? Summary The oral glucose tolerance test (GTT) is one of several tests used to diagnose diabetes that develops during pregnancy  (gestational diabetes mellitus). Gestational diabetes is a short-term form of diabetes that some women develop while they are pregnant. You may also have this test if you have any symptoms or risk factors for this type of diabetes. Talk with your health care provider about what your results mean. This information is not intended to replace advice given to you by your health care provider. Make sure you discuss any questions you have with your health care provider.  TDaP Vaccine Pregnancy Get the Whooping Cough Vaccine While You Are Pregnant (CDC)  It is important for women to get the whooping cough vaccine in the third trimester of each pregnancy. Vaccines are the best way to prevent this disease. There are 2 different whooping cough vaccines. Both vaccines combine protection against whooping cough, tetanus and diphtheria, but they are for different age groups: Tdap: for everyone 11 years or older, including pregnant women  DTaP: for children 2 months through 45 years of age  You need the whooping cough vaccine during each of your pregnancies The recommended time to get the shot is during your 27th through 36th week of pregnancy, preferably during the earlier part of this time period. The Centers for Disease Control and Prevention (CDC) recommends that pregnant women receive the whooping cough vaccine for adolescents and adults (called Tdap vaccine) during the third trimester of each pregnancy. The recommended time to get the shot is during your 27th through 36th week of pregnancy, preferably during the earlier part of this time period. This replaces the original recommendation that pregnant women get the vaccine only if they had not previously received it. The [employer] of Obstetricians and Gynecologists and the [employer] support this recommendation.  You should get the whooping cough vaccine while pregnant to pass protection to your baby frame support disabled  and/or not supported in this browser  Learn why Vernona Rieger decided to get the whooping cough vaccine in her 3rd trimester of pregnancy and how her baby girl was born with some protection against the disease. Also available on YouTube. After receiving the whooping cough vaccine, your body will create protective antibodies (proteins produced by the body to fight off diseases) and pass some of them to your baby before birth. These antibodies provide your baby some short-term protection against whooping cough in early life. These antibodies can also protect your baby from some of the more serious complications that come along with whooping cough. Your protective antibodies are at their highest about 2 weeks after getting the vaccine, but it takes time to pass them to your baby. So the preferred time to get the whooping cough vaccine is early in your third trimester. The amount of whooping cough antibodies in your body decreases over time. That is why CDC recommends you get a whooping cough vaccine during each pregnancy. Doing so allows each of your babies to get the greatest number of protective antibodies from you. This means each of your babies will get the best protection possible against this disease.  Getting the whooping cough vaccine while pregnant is better than getting the vaccine after you give birth Whooping cough vaccination during pregnancy is ideal so your baby will have short-term protection as soon as he  is born. This early protection is important because your baby will not start getting his whooping cough vaccines until he is 2 months old. These first few months of life are when your baby is at greatest risk for catching whooping cough. This is also when he's at greatest risk for having severe, potentially life-threating complications from the infection. To avoid that gap in protection, it is best to get a whooping cough vaccine during pregnancy. You will then pass protection to your baby before he  is born. To continue protecting your baby, he should get whooping cough vaccines starting at 2 months old. You may never have gotten the Tdap vaccine before and did not get it during this pregnancy. If so, you should make sure to get the vaccine immediately after you give birth, before leaving the hospital or birthing center. It will take about 2 weeks before your body develops protection (antibodies) in response to the vaccine. Once you have protection from the vaccine, you are less likely to give whooping cough to your newborn while caring for him. But remember, your baby will still be at risk for catching whooping cough from others. A recent study looked to see how effective Tdap was at preventing whooping cough in babies whose mothers got the vaccine while pregnant or in the hospital after giving birth. The study found that getting Tdap between 27 through 36 weeks of pregnancy is 85% more effective at preventing whooping cough in babies younger than 2 months old. Blood tests cannot tell if you need a whooping cough vaccine There are no blood tests that can tell you if you have enough antibodies in your body to protect yourself or your baby against whooping cough. Even if you have been sick with whooping cough in the past or previously received the vaccine, you still should get the vaccine during each pregnancy. Breastfeeding may pass some protective antibodies onto your baby By breastfeeding, you may pass some antibodies you have made in response to the vaccine to your baby. When you get a whooping cough vaccine during your pregnancy, you will have antibodies in your breast milk that you can share with your baby as soon as your milk comes in. However, your baby will not get protective antibodies immediately if you wait to get the whooping cough vaccine until after delivering your baby. This is because it takes about 2 weeks for your body to create antibodies. Learn more about the health benefits of  breastfeeding.

## 2023-04-04 NOTE — Progress Notes (Signed)
 PRENATAL VISIT NOTE  Subjective:  April James is a 33 y.o. [id number] at [redacted]w[redacted]d being seen today for ongoing prenatal care.  She is currently monitored for the following issues for this high-risk pregnancy and has Alcohol use disorder, mild, abuse; Supervision of high risk pregnancy, antepartum; Tobacco use affecting pregnancy, antepartum; Maternal morbid obesity, antepartum (HCC); Bipolar disease in pregnancy in third trimester (HCC); History of severe preeclampsia in prior pregnancy, currently pregnant; History of prior pregnancy with IUGR; Anxiety during pregnancy; Proteinuria during pregnancy; and Placenta previa antepartum, second trimester on their problem list.  Patient reports no complaints. Worried about recent diagnosis of placenta previa.  Contractions: Not present. Vag. Bleeding: None.  Movement: Present. Denies leaking of fluid.   The following portions of the patient's history were reviewed and updated as appropriate: allergies, current medications, past family history, past medical history, past social history, past surgical history and problem list.   Objective:   Vitals:   04/04/23 0926  BP: 131/82  Pulse: 81  Weight: 230 lb (104.3 kg)    Fetal Status: Fetal Heart Rate (bpm): 140   Movement: Present     General:  Alert, oriented and cooperative. Patient is in no acute distress.  Skin: Skin is warm and dry. No rash noted.   Cardiovascular: Normal heart rate noted  Respiratory: Normal respiratory effort, no problems with respiration noted  Abdomen: Soft, gravid, appropriate for gestational age.  Pain/Pressure: Present     Pelvic: Cervical exam deferred        Extremities: Normal range of motion.  Edema: None  Mental Status: Normal mood and affect. Normal behavior. Normal judgment and thought content.    [place] MFM OB DETAIL +14 WK Result Date: 04/03/2023 ----------------------------------------------------------------------  OBSTETRICS REPORT                       (Signed  Final 04/03/2023 12:40 pm) ---------------------------------------------------------------------- Patient Info  ID #:       [id number]                          D.O.B.:  Nov 09, 1990 (33 yrs)(F)  Name:       April James                   Visit Date: 04/03/2023 09:26 am ---------------------------------------------------------------------- Performed By  Attending:        Noralee Space MD        Ref. Address:     [address]                                                             [place], [place]                                                             [address]  Performed By:     Dennis Bast RDMS      [facility]  Referred By:      Tereso Newcomer MD ---------------------------------------------------------------------- Orders  #  Description                           Code        Ordered By  1  [place] MFM OB DETAIL +14 WK               52841.32    Jaynie Collins ----------------------------------------------------------------------  #  Order #                     Accession #                Episode #  1  [id number]                   [id number]                 [id number] ---------------------------------------------------------------------- Indications  Short interval between pregnancies, 2nd        O09.892  trimester  Obesity complicating pregnancy, second         O99.212  trimester (BMI 40)  Placenta previa specified as without           O44.02  hemorrhage, second trimester  Poor obstetric history: Previous               O09.299  preeclampsia / eclampsia/gestational HTN  Poor obstetric history: Previous fetal growth  O09.299  restriction (FGR)  Tobacco use complicating pregnancy,            O99.332  second trimester(pt states quit)  Medical complication of pregnancy  (anxiety)    O26.90  Encounter for antenatal screening for          Z36.3  malformations  [redacted] weeks gestation of pregnancy                Z3A.23 ---------------------------------------------------------------------- Vital Signs  BP:          122/57 ---------------------------------------------------------------------- Fetal Evaluation  Num Of Fetuses:         1  Fetal Heart Rate(bpm):  125  Cardiac Activity:       Observed  Presentation:           Cephalic  Placenta:               Posterior previa  P. Cord Insertion:      Visualized  Amniotic Fluid  AFI FV:      Within normal limits  Largest Pocket(cm)                              5.94 ---------------------------------------------------------------------- Biometry  BPD:      57.9  mm     G. Age:  23w 5d         62  %    CI:        74.76   %    70 - 86                                                          FL/HC:      19.0   %    19.2 - 20.8  HC:      212.5  mm     G. Age:  23w 2d         35  %    HC/AC:      1.15        1.05 - 1.21  AC:      184.2  mm     G. Age:  23w 2d         39  %    FL/BPD:     69.6   %    71 - 87  FL:       40.3  mm     G. Age:  23w 0d         30  %    FL/AC:      21.9   %    20 - 24  HUM:      38.8  mm     G. Age:  23w 6d         53  %  CER:      27.3  mm     G. Age:  24w 3d         93  %  LV:        3.8  mm  CM:        5.8  mm  Est. FW:     571  gm      1 lb 4 oz     37  % ---------------------------------------------------------------------- OB History  Gravidity:    5         Term:   2         SAB:   2  Living:       2 ---------------------------------------------------------------------- Gestational Age  LMP:           25w 0d        Date:  10/10/22                   EDD:   07/17/23  U/S Today:     23w 2d                                        EDD:   07/29/23  Best:          23w 2d     Det. By:  U/S (04/03/23)           EDD:   07/29/23 ---------------------------------------------------------------------- Targeted  Anatomy  Central Nervous System  Calvarium/Cranial V.:  Appears normal         Cereb./Vermis:          Appears normal  Cavum:                 Appears normal         Cisterna Magna:         Appears normal  Lateral Ventricles:    Appears normal         Midline Falx:           Appears normal  Choroid Plexus:        Appears normal  Spine  Cervical:              Not well visualized    Sacral:                 Not well visualized  Thoracic:              Not well visualized    Shape/Curvature:        Not well visualized  Lumbar:                Not well visualized  Head/Neck  Lips:                  Appears normal         Profile:                Appears normal  Neck:                  Appears normal         Orbits/Eyes:            Appears normal  Nuchal Fold:           Appears normal         Mandible:               Appears normal  Nasal Bone:            Present                Maxilla:                Appears normal  Thorax  4 Chamber View:        Appears normal         Interventr. Septum:     Not well visualized  Cardiac Rhythm:        Normal                 Cardiac Axis:           Normal  Cardiac Situs:         Appears normal         Diaphragm:              Appears normal  Rt Outflow Tract:      Appears normal         3 Vessel View:          Appears normal  Lt Outflow Tract:      Appears normal         3 V Trachea View:       Appears normal  Aortic Arch:           Not well visualized    IVC:                    Not well  visualized  Ductal Arch:           Appears normal         Crossing:               Appears normal  SVC:                   Not well visualized  Abdomen  Ventral Wall:          Appears normal         Lt Kidney:              Appears normal  Cord Insertion:        Appears normal         Rt Kidney:              Appears normal  Situs:                 Appears normal         Bladder:                Appears normal  Stomach:               Appears normal  Extremities  Lt Humerus:            Appears normal         Lt Femur:                Appears normal  Rt Humerus:            Appears normal         Rt Femur:               Appears normal  Lt Forearm:            Appears normal         Lt Lower Leg:           Appears normal  Rt Forearm:            Appears normal         Rt Lower Leg:           Appears normal  Lt Hand:               Open hand nml          Lt Foot:                Nml heel/foot  Rt Hand:               Open hand nml          Rt Foot:                Nml heel/foot  Other  Umbilical Cord:        Normal 3-vessel        Genitalia:              Female-nml ---------------------------------------------------------------------- Doppler - Fetal Vessels  Umbilical Artery   S/D     %tile      RI    %tile                      PSV    ADFV    RDFV                                                     (  cm/s)   3.23       33    0.69       36                     34.63      No      No ---------------------------------------------------------------------- Cervix Uterus Adnexa  Cervix  Length:            3.7  cm.  Normal appearance by transabdominal scan  Uterus  No abnormality visualized.  Right Ovary  Size(cm)     2.54   x   1.48   x  1.48      Vol(ml): 2.91  Within normal limits.  Left Ovary  Not visualized.  Cul De Sac  No free fluid seen.  Adnexa  No abnormality visualized ---------------------------------------------------------------------- Impression  On today's ultrasound, fetal biometry is consistent with 23  weeks and 2 days gestation.  Amniotic fluid is normal and  good fetal activity seen.  No markers of aneuploidies or  obvious fetal structural defects are seen.  Placenta previa is seen.  xxxxxxxxxxxxxxxxxxxxxxxxxxxxxxxxxxxxxxxxxxxxxxxxxxxxxxxxx  x  Maternal-Fetal Medicine Consultation  I had the pleasure of seeing Ms. April James today at [facility]. She is G5 P2022 at 56-  weeks' gestation (by unsure LMP) and is here for fetal  anatomy scan.  Her high risk pregnancy problems include  -Short interval between  pregnancies.  -History of fetal growth restriction in the second pregnancy.  -History of preeclampsia in her second pregnancy.  -Pregravid BMI 40.  Obstetric history  -2010: Term vaginal delivery of a female infant weighing 7  pounds and 13 ounces at birth.  -2024: Term vaginal delivery of her female infant weighing 4  pounds and 15 ounces at birth.  Her pregnancy was  complicated by fetal growth restriction.  The newborn was  discharged with his mother.  Both her children are in good health  Prenatal: Her pregnancy is dated by female length  measurement (16 weeks).  No other biometry was performed.  On cell-free fetal DNA screening, the risks of fetal  aneuploidies are not increased.  Patient gives history of vaping in this pregnancy.  Our concerns include:  Pregnancy dating  Her pregnancy was suboptimally dated by femoral length  measurement.  Complete biometry was not performed.  I  counseled the patient that we should date her pregnancy by  today's ultrasound measurements.  We have assigned her EDD at 07/29/2023.  History of fetal growth restriction.  Recurrence of fetal growth restriction is seen in 20% to 30%  of pregnancies.  We recommend serial fetal growth  assessments to rule out fetal growth restriction.  History of preeclampsia  She is pregnant with the same father.  Recurrence of  preeclampsia seen and 25% to 50% of pregnancies.  Patient  takes low-dose aspirin prophylaxis.  I discussed the benefit of  low-dose aspirin prophylaxis that delays or prevents  preeclampsia.  Placenta previa  Plan the findings with the help of ultrasound images.  In some  cases, placenta previa resolves with advancing gestation.  Patient does not give history of vaginal bleeding.  I counseled the patient that if placenta previa persists, we will  recommend delivery at [redacted] weeks gestation or at [redacted] weeks  gestation with bleeding.  I advised her to practice abstinence from sexual intercourse.  I did not address vaping today.  ---------------------------------------------------------------------- Recommendations  -An appointment was made for her to return  in 4 weeks for  fetal growth assessment.  -Fetal growth assessments every 4 weeks.  -Weekly BPP from [redacted] weeks gestation till delivery (BMI>40). ----------------------------------------------------------------------                 Noralee Space, MD Electronically Signed Final Report   04/03/2023 12:40 pm ----------------------------------------------------------------------    Assessment and Plan:  Pregnancy: [id number] at [redacted]w[redacted]d 1. Posterior placenta previa antepartum, second trimester (Primary) Talked about this diagnosis, all questions answered. Reinforced pelvic rest precautions, and bleeding precautions.  2. History of severe preeclampsia in prior pregnancy, currently pregnant Stable BP, on ASA 162 mg daily.   3. History of prior pregnancy with IUGR Continue serial growth scans as per MFM.  4. Maternal morbid obesity, antepartum (HCC) TWG 3 lb.    5. [redacted] weeks gestation of pregnancy 6. Supervision of high risk pregnancy, antepartum Discussed need for GTT and labs next visit. Preterm labor symptoms and general obstetric precautions including but not limited to vaginal bleeding, contractions, leaking of fluid and fetal movement were reviewed in detail with the patient. Please refer to After Visit Summary for other counseling recommendations.   Return in about 4 weeks (around 05/02/2023) for 2 hr GTT, 3rd trimester labs, TDap, OFFICE OB VISIT (MD only).  Future Appointments  Date Time Provider Department Center  04/21/2023  2:40 PM Thomasene Ripple, DO [facility] None  05/01/2023 10:30 AM [facility] US[facility]  05/02/2023 10:35 AM Gerrick Ray, Jethro Bastos, MD [facility]  05/30/2023  9:35 AM Bernice Mcauliffe, Jethro Bastos, MD [facility]    Jaynie Collins, MD

## 2023-04-11 ENCOUNTER — Encounter: Payer: Self-pay | Admitting: Obstetrics & Gynecology

## 2023-04-11 ENCOUNTER — Other Ambulatory Visit: Payer: Self-pay | Admitting: *Deleted

## 2023-04-11 MED ORDER — ONDANSETRON 4 MG PO TBDP: 4.0000 mg | ORAL_TABLET | Freq: Four times a day (QID) | ORAL | 1 refills | Status: AC | PRN

## 2023-04-12 ENCOUNTER — Encounter (HOSPITAL_COMMUNITY): Payer: Self-pay | Admitting: Obstetrics & Gynecology

## 2023-04-12 ENCOUNTER — Other Ambulatory Visit: Payer: Self-pay

## 2023-04-12 ENCOUNTER — Inpatient Hospital Stay (HOSPITAL_COMMUNITY)
Admission: AD | Admit: 2023-04-12 | Discharge: 2023-04-15 | DRG: 833 | Disposition: A | Discharge status: Home / Self Care | Attending: Obstetrics & Gynecology | Admitting: Obstetrics & Gynecology

## 2023-04-12 DIAGNOSIS — Z880 Allergy status to penicillin: Secondary | ICD-10-CM | POA: Diagnosis not present

## 2023-04-12 DIAGNOSIS — O99342 Other mental disorders complicating pregnancy, second trimester: Secondary | ICD-10-CM | POA: Diagnosis present

## 2023-04-12 DIAGNOSIS — O99612 Diseases of the digestive system complicating pregnancy, second trimester: Secondary | ICD-10-CM | POA: Diagnosis not present

## 2023-04-12 DIAGNOSIS — K529 Noninfective gastroenteritis and colitis, unspecified: Secondary | ICD-10-CM | POA: Diagnosis not present

## 2023-04-12 DIAGNOSIS — O4403 Placenta previa specified as without hemorrhage, third trimester: Secondary | ICD-10-CM | POA: Diagnosis present

## 2023-04-12 DIAGNOSIS — O4402 Placenta previa specified as without hemorrhage, second trimester: Secondary | ICD-10-CM | POA: Diagnosis present

## 2023-04-12 DIAGNOSIS — O4413 Placenta previa with hemorrhage, third trimester: Principal | ICD-10-CM | POA: Diagnosis present

## 2023-04-12 DIAGNOSIS — Z3A25 25 weeks gestation of pregnancy: Secondary | ICD-10-CM | POA: Diagnosis not present

## 2023-04-12 DIAGNOSIS — Z833 Family history of diabetes mellitus: Secondary | ICD-10-CM

## 2023-04-12 DIAGNOSIS — Z8249 Family history of ischemic heart disease and other diseases of the circulatory system: Secondary | ICD-10-CM | POA: Diagnosis not present

## 2023-04-12 DIAGNOSIS — Z3A24 24 weeks gestation of pregnancy: Secondary | ICD-10-CM | POA: Diagnosis not present

## 2023-04-12 DIAGNOSIS — O0990 Supervision of high risk pregnancy, unspecified, unspecified trimester: Secondary | ICD-10-CM

## 2023-04-12 DIAGNOSIS — Z3A21 21 weeks gestation of pregnancy: Secondary | ICD-10-CM

## 2023-04-12 DIAGNOSIS — Z87891 Personal history of nicotine dependence: Secondary | ICD-10-CM | POA: Diagnosis not present

## 2023-04-12 DIAGNOSIS — O1210 Gestational proteinuria, unspecified trimester: Secondary | ICD-10-CM

## 2023-04-12 DIAGNOSIS — Z8759 Personal history of other complications of pregnancy, childbirth and the puerperium: Secondary | ICD-10-CM

## 2023-04-12 DIAGNOSIS — F419 Anxiety disorder, unspecified: Secondary | ICD-10-CM | POA: Diagnosis present

## 2023-04-12 DIAGNOSIS — O4412 Placenta previa with hemorrhage, second trimester: Secondary | ICD-10-CM | POA: Diagnosis not present

## 2023-04-12 DIAGNOSIS — O99212 Obesity complicating pregnancy, second trimester: Secondary | ICD-10-CM | POA: Diagnosis not present

## 2023-04-12 DIAGNOSIS — O09299 Supervision of pregnancy with other poor reproductive or obstetric history, unspecified trimester: Principal | ICD-10-CM

## 2023-04-12 LAB — CBC
HCT: 34.9 % — ABNORMAL LOW (ref 36.0–46.0)
Hemoglobin: 11.9 g/dL — ABNORMAL LOW (ref 12.0–15.0)
MCH: 31.6 pg (ref 26.0–34.0)
MCHC: 34.1 g/dL (ref 30.0–36.0)
MCV: 92.8 fL (ref 80.0–100.0)
Platelets: 422 10*3/uL — ABNORMAL HIGH (ref 150–400)
RBC: 3.76 MIL/uL — ABNORMAL LOW (ref 3.87–5.11)
RDW: 13.1 % (ref 11.5–15.5)
WBC: 13.9 10*3/uL — ABNORMAL HIGH (ref 4.0–10.5)
nRBC: 0 % (ref 0.0–0.2)

## 2023-04-12 LAB — TYPE AND SCREEN
ABO/RH(D): A POS
Antibody Screen: NEGATIVE

## 2023-04-12 MED ORDER — ONDANSETRON HCL 4 MG/2ML IJ SOLN: 4.0000 mg | Freq: Four times a day (QID) | INTRAMUSCULAR | Status: DC | PRN

## 2023-04-12 MED ORDER — ONDANSETRON 4 MG PO TBDP
4.0000 mg | ORAL_TABLET | Freq: Four times a day (QID) | ORAL | Status: DC | PRN
Start: 2023-04-12 — End: 2023-04-15
  Administered 2023-04-12 (×2): 4 mg via ORAL
  Filled 2023-04-12 (×2): qty 1

## 2023-04-12 MED ORDER — HYDROXYZINE HCL 50 MG PO TABS
25.0000 mg | ORAL_TABLET | Freq: Three times a day (TID) | ORAL | Status: DC | PRN
  Administered 2023-04-12 – 2023-04-15 (×9): 25 mg via ORAL
  Filled 2023-04-12 (×9): qty 1

## 2023-04-12 MED ORDER — DOCUSATE SODIUM 100 MG PO CAPS: 100.0000 mg | ORAL_CAPSULE | Freq: Every day | ORAL | Status: DC

## 2023-04-12 MED ORDER — ACETAMINOPHEN 325 MG PO TABS
650.0000 mg | ORAL_TABLET | ORAL | Status: DC | PRN
  Administered 2023-04-12 – 2023-04-14 (×5): 650 mg via ORAL
  Filled 2023-04-12 (×5): qty 2

## 2023-04-12 MED ORDER — SENNOSIDES-DOCUSATE SODIUM 8.6-50 MG PO TABS
2.0000 | ORAL_TABLET | Freq: Every day | ORAL | Status: DC
  Administered 2023-04-14: 2 via ORAL
  Filled 2023-04-12 (×3): qty 2

## 2023-04-12 MED ORDER — SODIUM CHLORIDE 0.9% FLUSH: 3.0000 mL | INTRAVENOUS | Status: DC | PRN

## 2023-04-12 MED ORDER — SODIUM CHLORIDE 0.9 % IV SOLN: 25.0000 mg | Freq: Four times a day (QID) | INTRAVENOUS | Status: DC | PRN

## 2023-04-12 MED ORDER — FERROUS SULFATE 325 (65 FE) MG PO TABS
325.0000 mg | ORAL_TABLET | ORAL | Status: DC
  Administered 2023-04-12 – 2023-04-14 (×2): 325 mg via ORAL
  Filled 2023-04-12 (×2): qty 1

## 2023-04-12 MED ORDER — BETAMETHASONE SOD PHOS & ACET 6 (3-3) MG/ML IJ SUSP
12.0000 mg | INTRAMUSCULAR | Status: AC
  Administered 2023-04-12 – 2023-04-13 (×2): 12 mg via INTRAMUSCULAR
  Filled 2023-04-12: qty 5

## 2023-04-12 MED ORDER — SODIUM CHLORIDE 0.9% FLUSH
3.0000 mL | Freq: Two times a day (BID) | INTRAVENOUS | Status: DC
  Administered 2023-04-12: 5 mL via INTRAVENOUS
  Administered 2023-04-12: 10 mL via INTRAVENOUS
  Administered 2023-04-13: 5 mL via INTRAVENOUS
  Administered 2023-04-14 – 2023-04-15 (×3): 10 mL via INTRAVENOUS

## 2023-04-12 MED ORDER — DIPHENOXYLATE-ATROPINE 2.5-0.025 MG PO TABS
2.0000 | ORAL_TABLET | Freq: Four times a day (QID) | ORAL | Status: DC | PRN
  Administered 2023-04-12: 2 via ORAL
  Filled 2023-04-12: qty 2

## 2023-04-12 MED ORDER — LORATADINE 10 MG PO TABS
10.0000 mg | ORAL_TABLET | Freq: Every day | ORAL | Status: DC
  Administered 2023-04-12 – 2023-04-15 (×4): 10 mg via ORAL
  Filled 2023-04-12 (×4): qty 1

## 2023-04-12 MED ORDER — PRENATAL MULTIVITAMIN CH
1.0000 | ORAL_TABLET | Freq: Every day | ORAL | Status: DC
  Administered 2023-04-12 – 2023-04-14 (×3): 1 via ORAL
  Filled 2023-04-12 (×3): qty 1

## 2023-04-12 MED ORDER — CALCIUM CARBONATE ANTACID 500 MG PO CHEW
2.0000 | CHEWABLE_TABLET | ORAL | Status: DC | PRN
  Administered 2023-04-15: 400 mg via ORAL
  Filled 2023-04-12: qty 2

## 2023-04-12 MED ORDER — CYCLOBENZAPRINE HCL 5 MG PO TABS
10.0000 mg | ORAL_TABLET | Freq: Three times a day (TID) | ORAL | Status: DC | PRN
  Filled 2023-04-12: qty 2

## 2023-04-12 NOTE — MAU Note (Signed)
.  April James is a 33 y.o. at [redacted]w[redacted]d here in MAU reporting vaginal bleeding about an hour ago. The initial bleeding was more with a small clot but after that is has lightened up. Reports good FM. No pain  LMP: n/a Onset of complaint: 0015 Pain score: 0 Vitals:   04/12/23 0116 04/12/23 0119  BP:  134/60  Pulse: 84   Resp: 18   Temp: 97.6 F (36.4 C)   SpO2: 97%      FHT: 145  Lab orders placed from triage: none

## 2023-04-12 NOTE — H&P (Signed)
 FACULTY PRACTICE ANTEPARTUM ADMISSION HISTORY AND PHYSICAL NOTE  History of Present Illness: April Rankin is a 33 y.o. [id number] at [redacted]w[redacted]d with known posterior placental previa who presents to maternity admissions reporting onset of vaginal bleeding at about 0015. The initial bleeding was more with a small clot but after that is has lightened up.  No associated pain.  This is her first bleeding episode.  Reported having nausea, vomiting and diarrhea x 2 days, no new foods, no sick contacts but unable to eat much in the last two days. Denies any fevers, chills, sweats, dysuria, other general symptoms.  Patient reports the fetal movement as active. Patient reports uterine contraction  activity as none. Patient reports  vaginal bleeding as less flow than a normal period. Patient describes fluid per vagina as none.  Patient Active Problem List   Diagnosis Date Noted   Placenta previa with bleeding, second trimester 04/12/2023   Gastroenteritis 04/12/2023   Placenta previa antepartum, second trimester 04/04/2023   Proteinuria during pregnancy 03/13/2023   History of severe preeclampsia in prior pregnancy, currently pregnant 03/09/2023   History of prior pregnancy with IUGR 03/09/2023   Anxiety during pregnancy 03/09/2023   Maternal morbid obesity, antepartum (HCC) 03/08/2022   Bipolar disease in pregnancy (HCC) 03/08/2022   Supervision of high risk pregnancy, antepartum 01/13/2022   Tobacco use affecting pregnancy, antepartum 01/13/2022   Alcohol use disorder, mild, abuse 05/03/2015    Past Medical History:  Diagnosis Date   Asthma    Bipolar 1 disorder (HCC)    Chlamydia infection affecting pregnancy in second trimester 01/19/2022   Toc neg early feb 2024   Preeclampsia, severe 06/01/2022    Past Surgical History:  Procedure Laterality Date   CHOLECYSTECTOMY     TONSILLECTOMY      OB History  Gravida Para Term Preterm AB Living  5 2 2  0 2 2  SAB IAB Ectopic Multiple Live  Births  1 1 0 0 2    # Outcome Date GA Lbr Len/2nd Weight Sex Type Anes PTL Lv  5 Current           4 Term 05/25/22 [redacted]w[redacted]d 09:48 / 00:12 2230 g M Vag-Spont EPI  LIV  3 SAB 2023          2 IAB 2015          1 Term 12/20/08    M Vag-Spont   LIV    Social History   Socioeconomic History   Marital status: Significant Other    Spouse name: Not on file   Number of children: Not on file   Years of education: Not on file   Highest education level: Not on file  Occupational History   Not on file  Tobacco Use   Smoking status: Former    Current packs/day: 0.20    Average packs/day: 0.2 packs/day for 16.0 years (3.2 ttl pk-yrs)    Types: Cigarettes    Passive exposure: Current   Smokeless tobacco: Never  Vaping Use   Vaping status: Former   Substances: Flavoring  Substance and Sexual Activity   Alcohol use: Not Currently    Comment: stopped for now   Drug use: Not Currently    Types: Marijuana    Comment: "Not very often". Last used: 2 months ago.    Sexual activity: Yes    Birth control/protection: None  Other Topics Concern   Not on file  Social History Narrative   Not on file  Social Drivers of Corporate investment banker Strain: Not on file  Food Insecurity: No Food Insecurity (04/12/2023)   Hunger Vital Sign    Worried About Running Out of Food in the Last Year: Never true    Ran Out of Food in the Last Year: Never true  Transportation Needs: No Transportation Needs (04/12/2023)   PRAPARE - Administrator, Civil Service (Medical): No    Lack of Transportation (Non-Medical): No  Physical Activity: Not on file  Stress: Not on file  Social Connections: Not on file    Family History  Problem Relation Age of Onset   Bipolar disorder Father    Heart disease Father    Aneurysm Father    Asthma Son    Diabetes Maternal Grandfather    Diabetes Paternal Grandmother    Cancer Paternal Grandmother    Hypertension Paternal Grandfather    Stroke Neg Hx      Allergies  Allergen Reactions   Penicillins Hives and Shortness Of Breath   Sulfa Antibiotics Rash    Medications Prior to Admission  Medication Sig Dispense Refill Last Dose/Taking   aspirin EC 81 MG tablet Take 2 tablets (162 mg total) by mouth at bedtime. Start taking when you are [redacted] weeks pregnant for rest of pregnancy for prevention of preeclampsia 300 tablet 2 04/11/2023   ondansetron (ZOFRAN-ODT) 4 MG disintegrating tablet Take 1 tablet (4 mg total) by mouth every 6 (six) hours as needed for nausea. 20 tablet 1 04/11/2023   Prenatal Vit-Fe Fumarate-FA (MULTIVITAMIN-PRENATAL) 27-0.8 MG TABS tablet Take 1 tablet by mouth daily at 12 noon.   04/11/2023    Review of Systems - Negative except what is mentioned in HPI  Vitals:  BP (!) 122/53 (BP Location: Right Arm)   Pulse 78   Temp 97.6 F (36.4 C) (Oral)   Resp 16   Ht 5\' 3"  (1.6 m)   Wt 103 kg   LMP 10/10/2022 (Approximate)   SpO2 95%   BMI 40.21 kg/m  Physical Examination: CONSTITUTIONAL: Well-developed, well-nourished female in no acute distress.  HENT:  Normocephalic, atraumatic, External right and left ear normal. Oropharynx is clear and moist EYES: Conjunctivae and EOM are normal. Pupils are equal, round, and reactive to light. No scleral icterus.  NECK: Normal range of motion, supple, no masses SKIN: Skin is warm and dry. No rash noted. Not diaphoretic. No erythema. No pallor. NEUROLOGIC: Alert and oriented to person, place, and time. Normal reflexes, muscle tone coordination. No cranial nerve deficit noted. PSYCHIATRIC: Normal mood and affect. Normal behavior. Normal judgment and thought content. CARDIOVASCULAR: Normal heart rate noted, regular rhythm RESPIRATORY: Effort and breath sounds normal, no problems with respiration noted ABDOMEN: Soft, nontender, nondistended, gravid. MUSCULOSKELETAL: Normal range of motion. No edema and no tenderness. 2+ distal pulses. PELVIC:  Small amount of old blood and clots noted  on exam by Wynelle Bourgeois, CNM, closed cervix.   Fetal Monitoring:Baseline: 145 bpm, Variability: Moderate, Accelerations: 10 x10, Non-reactive but appropriate for gestational age, and Decelerations: Absent Tocometer: Irritability noted  Labs:  Results for orders placed or performed during the hospital encounter of 04/12/23 (from the past 24 hours)  CBC   Collection Time: 04/12/23  2:07 AM  Result Value Ref Range   WBC 13.9 (H) 4.0 - 10.5 K/uL   RBC 3.76 (L) 3.87 - 5.11 MIL/uL   Hemoglobin 11.9 (L) 12.0 - 15.0 g/dL   HCT 16.1 (L) 09.6 - 04.5 %   MCV 92.8  80.0 - 100.0 fL   MCH 31.6 26.0 - 34.0 pg   MCHC 34.1 30.0 - 36.0 g/dL   RDW 16.1 09.6 - 04.5 %   Platelets 422 (H) 150 - 400 K/uL   nRBC 0.0 0.0 - 0.2 %  Type and screen [facility]   Collection Time: 04/12/23  2:07 AM  Result Value Ref Range   ABO/RH(D) A POS    Antibody Screen NEG    Sample Expiration      04/15/2023,2359 Performed at [facility], [address] [place], [place] [address]     Imaging Studies: [place] MFM OB DETAIL +14 WK Result Date: 04/03/2023 ----------------------------------------------------------------------  OBSTETRICS REPORT                       (Signed Final 04/03/2023 12:40 pm) ---------------------------------------------------------------------- Patient Info  ID #:       [id number]                          D.O.B.:  09-Jul-1990 (33 yrs)(F)  Name:       April James                   Visit Date: 04/03/2023 09:26 am ---------------------------------------------------------------------- Performed By  Attending:        Noralee Space MD        Ref. Address:     [address]                                                             [place], [place]                                                             [address]  Performed By:     Dennis Bast RDMS      [facility]  Referred By:      Vela Prose  Stevie Kern MD ---------------------------------------------------------------------- Orders  #  Description                           Code        Ordered By  1  [place] MFM OB DETAIL +14 WK               76811.01    Jaynie Collins ----------------------------------------------------------------------  #  Order #                     Accession #                Episode #  1  [id number]                   [id number]                 [id number] ---------------------------------------------------------------------- Indications  Short interval between pregnancies, 2nd        O09.892  trimester  Obesity complicating pregnancy, second         O99.212  trimester (BMI 40)  Placenta previa specified as without           O44.02  hemorrhage, second trimester  Poor obstetric history: Previous               O09.299  preeclampsia / eclampsia/gestational HTN  Poor obstetric history: Previous fetal growth  O09.299  restriction (FGR)  Tobacco use complicating pregnancy,            O99.332  second trimester(pt states quit)  Medical complication of pregnancy (anxiety)    O26.90  Encounter for antenatal screening for          Z36.3  malformations  [redacted] weeks gestation of pregnancy                Z3A.23 ---------------------------------------------------------------------- Vital Signs  BP:          122/57 ---------------------------------------------------------------------- Fetal Evaluation  Num Of Fetuses:         1  Fetal Heart Rate(bpm):  125  Cardiac Activity:       Observed  Presentation:           Cephalic  Placenta:               Posterior previa  P. Cord Insertion:      Visualized  Amniotic Fluid  AFI FV:      Within normal limits                              Largest Pocket(cm)                              5.94 ---------------------------------------------------------------------- Biometry   BPD:      57.9  mm     G. Age:  23w 5d         62  %    CI:        74.76   %    70 - 86  FL/HC:      19.0   %    19.2 - 20.8  HC:      212.5  mm     G. Age:  23w 2d         35  %    HC/AC:      1.15        1.05 - 1.21  AC:      184.2  mm     G. Age:  23w 2d         39  %    FL/BPD:     69.6   %    71 - 87  FL:       40.3  mm     G. Age:  23w 0d         30  %    FL/AC:      21.9   %    20 - 24  HUM:      38.8  mm     G. Age:  23w 6d         53  %  CER:      27.3  mm     G. Age:  24w 3d         93  %  LV:        3.8  mm  CM:        5.8  mm  Est. FW:     571  gm      1 lb 4 oz     37  % ---------------------------------------------------------------------- OB History  Gravidity:    5         Term:   2         SAB:   2  Living:       2 ---------------------------------------------------------------------- Gestational Age  LMP:           25w 0d        Date:  10/10/22                   EDD:   07/17/23  U/S Today:     23w 2d                                        EDD:   07/29/23  Best:          23w 2d     Det. By:  U/S (04/03/23)           EDD:   07/29/23 ---------------------------------------------------------------------- Targeted Anatomy  Central Nervous System  Calvarium/Cranial V.:  Appears normal         Cereb./Vermis:          Appears normal  Cavum:                 Appears normal         Cisterna Magna:         Appears normal  Lateral Ventricles:    Appears normal         Midline Falx:           Appears normal  Choroid Plexus:        Appears normal  Spine  Cervical:              Not well visualized    Sacral:  Not well visualized  Thoracic:              Not well visualized    Shape/Curvature:        Not well visualized  Lumbar:                Not well visualized  Head/Neck  Lips:                  Appears normal         Profile:                Appears normal  Neck:                  Appears normal         Orbits/Eyes:            Appears normal  Nuchal  Fold:           Appears normal         Mandible:               Appears normal  Nasal Bone:            Present                Maxilla:                Appears normal  Thorax  4 Chamber View:        Appears normal         Interventr. Septum:     Not well visualized  Cardiac Rhythm:        Normal                 Cardiac Axis:           Normal  Cardiac Situs:         Appears normal         Diaphragm:              Appears normal  Rt Outflow Tract:      Appears normal         3 Vessel View:          Appears normal  Lt Outflow Tract:      Appears normal         3 V Trachea View:       Appears normal  Aortic Arch:           Not well visualized    IVC:                    Not well visualized  Ductal Arch:           Appears normal         Crossing:               Appears normal  SVC:                   Not well visualized  Abdomen  Ventral Wall:          Appears normal         Lt Kidney:              Appears normal  Cord Insertion:        Appears normal         Rt Kidney:              Appears normal  Situs:  Appears normal         Bladder:                Appears normal  Stomach:               Appears normal  Extremities  Lt Humerus:            Appears normal         Lt Femur:               Appears normal  Rt Humerus:            Appears normal         Rt Femur:               Appears normal  Lt Forearm:            Appears normal         Lt Lower Leg:           Appears normal  Rt Forearm:            Appears normal         Rt Lower Leg:           Appears normal  Lt Hand:               Open hand nml          Lt Foot:                Nml heel/foot  Rt Hand:               Open hand nml          Rt Foot:                Nml heel/foot  Other  Umbilical Cord:        Normal 3-vessel        Genitalia:              Female-nml ---------------------------------------------------------------------- Doppler - Fetal Vessels  Umbilical Artery   S/D     %tile      RI    %tile                      PSV    ADFV    RDFV                                                      (cm/s)   3.23       33    0.69       36                     34.63      No      No ---------------------------------------------------------------------- Cervix Uterus Adnexa  Cervix  Length:            3.7  cm.  Normal appearance by transabdominal scan  Uterus  No abnormality visualized.  Right Ovary  Size(cm)     2.54   x   1.48   x  1.48      Vol(ml): 2.91  Within normal limits.  Left Ovary  Not visualized.  Cul De Sac  No free fluid seen.  Adnexa  No abnormality visualized ---------------------------------------------------------------------- Impression  On today's ultrasound, fetal  biometry is consistent with 23  weeks and 2 days gestation.  Amniotic fluid is normal and  good fetal activity seen.  No markers of aneuploidies or  obvious fetal structural defects are seen.  Placenta previa is seen.  xxxxxxxxxxxxxxxxxxxxxxxxxxxxxxxxxxxxxxxxxxxxxxxxxxxxxxxxx  x  Maternal-Fetal Medicine Consultation  I had the pleasure of seeing Ms. April Rosenzweig today at [facility]. She is G5 P2022 at 45-  weeks' gestation (by unsure LMP) and is here for fetal  anatomy scan.  Her high risk pregnancy problems include  -Short interval between pregnancies.  -History of fetal growth restriction in the second pregnancy.  -History of preeclampsia in her second pregnancy.  -Pregravid BMI 40.  Obstetric history  -2010: Term vaginal delivery of a female infant weighing 7  pounds and 13 ounces at birth.  -2024: Term vaginal delivery of her female infant weighing 4  pounds and 15 ounces at birth.  Her pregnancy was  complicated by fetal growth restriction.  The newborn was  discharged with his mother.  Both her children are in good health  Prenatal: Her pregnancy is dated by female length  measurement (16 weeks).  No other biometry was performed.  On cell-free fetal DNA screening, the risks of fetal  aneuploidies are not increased.  Patient gives history of vaping in this pregnancy.  Our  concerns include:  Pregnancy dating  Her pregnancy was suboptimally dated by femoral length  measurement.  Complete biometry was not performed.  I  counseled the patient that we should date her pregnancy by  today's ultrasound measurements.  We have assigned her EDD at 07/29/2023.  History of fetal growth restriction.  Recurrence of fetal growth restriction is seen in 20% to 30%  of pregnancies.  We recommend serial fetal growth  assessments to rule out fetal growth restriction.  History of preeclampsia  She is pregnant with the same father.  Recurrence of  preeclampsia seen and 25% to 50% of pregnancies.  Patient  takes low-dose aspirin prophylaxis.  I discussed the benefit of  low-dose aspirin prophylaxis that delays or prevents  preeclampsia.  Placenta previa  Plan the findings with the help of ultrasound images.  In some  cases, placenta previa resolves with advancing gestation.  Patient does not give history of vaginal bleeding.  I counseled the patient that if placenta previa persists, we will  recommend delivery at [redacted] weeks gestation or at [redacted] weeks  gestation with bleeding.  I advised her to practice abstinence from sexual intercourse.  I did not address vaping today. ---------------------------------------------------------------------- Recommendations  -An appointment was made for her to return in 4 weeks for  fetal growth assessment.  -Fetal growth assessments every 4 weeks.  -Weekly BPP from [redacted] weeks gestation till delivery (BMI>40). ----------------------------------------------------------------------                 Noralee Space, MD Electronically Signed Final Report   04/03/2023 12:40 pm ----------------------------------------------------------------------    Assessment and Plan: Patient Active Problem List   Diagnosis Date Noted   Placenta previa with bleeding, second trimester 04/12/2023   Gastroenteritis 04/12/2023   Placenta previa antepartum, second trimester 04/04/2023   Proteinuria  during pregnancy 03/13/2023   History of severe preeclampsia in prior pregnancy, currently pregnant 03/09/2023   History of prior pregnancy with IUGR 03/09/2023   Anxiety during pregnancy 03/09/2023   Maternal morbid obesity, antepartum (HCC) 03/08/2022   Bipolar disease in pregnancy (HCC) 03/08/2022   Supervision of high risk pregnancy, antepartum 01/13/2022  Tobacco use affecting pregnancy, antepartum 01/13/2022   Alcohol use disorder, mild, abuse 05/03/2015   Admit to Antenatal Betamethasone x 2 doses ordered Antiemetics ordered for GI symptoms, likely gastroenteritis. Will continue to monitor.  This likely precipitated her bleeding episode. No signs of labor or worsening bleeding for now, if there is any concern, will start magnesium sulfate for tocolysis and fetal neuroprotection. Hemoglobin 11.9, oral iron therapy ordered.  Will recheck as needed. She has A positive blood type.  Will make sure she has an active Type and Screen at all times.   Reassuring fetal heart rate tracing for now, will do continuous FHR and tocometry monitoring for now.  Neonatalogy to be consulted later in the day. Patient is aware of need for cesarean delivery for severe bleeding or fetal indications, consent obtained.  The risks of surgery were discussed with the patient including but were not limited to: bleeding which may require transfusion or reoperation; infection which may require antibiotics; injury to bowel, bladder, ureters or other surrounding organs; injury to the fetus; need for additional procedures including hysterectomy in the event of a life-threatening hemorrhage; formation of adhesions; placental abnormalities with subsequent pregnancies; incisional problems; thromboembolic phenomenon and other postoperative/anesthesia complications.  The patient concurred with the proposed plan, giving informed written consent for the procedure. Consent placed in physical chart. Will monitor closely, consider  discharge to home with close outpatient follow up if no further bleeding for at least 3-5 days, given that this is her first episode and if she remains hemodynamically stable. Routine antenatal care.   Jaynie Collins, MD, FACOG Attending Obstetrician & Gynecologist Faculty Practice, [facility]

## 2023-04-12 NOTE — Progress Notes (Signed)
 Pt requested to take a shower MD Debroah Loop notified and is okay with shower. RN to take Pt off of EFM during shower and replace EFM when finished.

## 2023-04-13 DIAGNOSIS — N939 Abnormal uterine and vaginal bleeding, unspecified: Secondary | ICD-10-CM | POA: Diagnosis not present

## 2023-04-13 DIAGNOSIS — Z3A24 24 weeks gestation of pregnancy: Secondary | ICD-10-CM | POA: Diagnosis not present

## 2023-04-13 DIAGNOSIS — O4412 Placenta previa with hemorrhage, second trimester: Secondary | ICD-10-CM | POA: Diagnosis not present

## 2023-04-13 NOTE — Progress Notes (Signed)
 FACULTY PRACTICE ANTEPARTUM COMPREHENSIVE PROGRESS NOTE  April James is a 33 y.o. [id number] at [redacted]w[redacted]d with placenta previa who is admitted for sentinel bleed.  Estimated Date of Delivery: 07/29/23  Length of Stay:  1 Days. Admitted 04/12/2023  Subjective: Much improved this morning. Bleeding is just light pink spotting. No pain, LOF. Good fetal movement.  Is worried about length of hospitalization. Does not have childcare after Fri/Sat.   Vitals:  Blood pressure 137/85, pulse 76, temperature 97.9 F (36.6 C), temperature source Oral, resp. rate 16, height 5\' 3"  (1.6 m), weight 103 kg, last menstrual period 10/10/2022, SpO2 99%, not currently breastfeeding. Physical Examination: CONSTITUTIONAL: Well-developed, well-nourished female in no acute distress.  CARDIOVASCULAR: Normal heart rate noted RESPIRATORY: Effort normal, no problems with respiration noted ABDOMEN: Soft, nontender, nondistended, gravid.  Fetal monitoring: FHR: 140 bpm, Variability: moderate, Accelerations: Absent, Decelerations: Absent  Uterine activity: flat  Results for orders placed or performed during the hospital encounter of 04/12/23 (from the past 48 hours)  Type and screen [facility]     Status: None   Collection Time: 04/12/23  2:07 AM  Result Value Ref Range   ABO/RH(D) A POS    Antibody Screen NEG    Sample Expiration      04/15/2023,2359 Performed at [facility], [address] [place], [place] [address]   CBC     Status: Abnormal   Collection Time: 04/12/23  2:07 AM  Result Value Ref Range   WBC 13.9 (H) 4.0 - 10.5 K/uL   RBC 3.76 (L) 3.87 - 5.11 MIL/uL   Hemoglobin 11.9 (L) 12.0 - 15.0 g/dL   HCT 95.6 (L) 21.3 - 08.6 %   MCV 92.8 80.0 - 100.0 fL   MCH 31.6 26.0 - 34.0 pg   MCHC 34.1 30.0 - 36.0 g/dL   RDW 57.8 46.9 - 62.9 %   Platelets 422 (H) 150 - 400 K/uL   nRBC 0.0 0.0 - 0.2 %    Comment: Performed at [facility], [address], [place], [place] [address]     No results found.  Current scheduled medications  ferrous sulfate  325 mg Oral QODAY   loratadine  10 mg Oral Daily   prenatal multivitamin  1 tablet Oral Q1200   senna-docusate  2 tablet Oral QHS   sodium chloride flush  3-10 mL Intravenous Q12H    I have reviewed the patient's current medications.  ASSESSMENT: Principal Problem:   Placenta previa with bleeding, second trimester Active Problems:   Supervision of high risk pregnancy, antepartum   Maternal morbid obesity, antepartum (HCC)   History of severe preeclampsia in prior pregnancy, currently pregnant   Anxiety during pregnancy   Placenta previa antepartum, second trimester   Gastroenteritis   PLAN: S/p BMZ 3/12-13 (0231) Mag eligible NICU consult today @23 /2 571g (37%), AC 39% CS consent previously signed BID NST  Continue routine antenatal care. Discussed inpatient management for ~5 days pending clinical course. Voiced concerns about childcare, but will continue to address prn  Harvie Bridge, MD Obstetrician & Gynecologist, [facility] [employer], [facility]

## 2023-04-13 NOTE — Consult Note (Addendum)
 [facility]  Prenatal Consult      04/13/2023   11:05 AM  I was asked by Dr. Mariel Aloe to consult on this patient for possible preterm delivery. I had the pleasure of meeting with April James today. She was accompanied by her mother.  She is a 33 yo G37P2022 female presenting at [redacted]w[redacted]d IUP with concerns for posterior placenta previa with vaginal bleeding following gastroenteritis. Pregnancy has also been complicated by short interval pregnancy, maternal history of severe pre eclampsia in prior pregnancy (on ASA this pregnancy and BP and fetal growth thus far normal), maternal obesity, anxiety on vistaril, tobacco use (vaping) without interest in decreasing consumption. She has received BMZ x2 (last dose 3/13). Most recent estimated fetal weight of 571 grams on 04/03/23 at [redacted]w[redacted]d (37%ile). She is expecting a baby girl.  We discussed the possible needs for an infant born at this gestation. I explained that the neonatal intensive care team would be present for the delivery and outlined the likely delivery room course for this baby including routine resuscitation and NRP-guided approaches to the treatment of respiratory distress. We discussed the potential need for CPAP or mechanical ventilation and surfactant administration for respiratory distress, IV fluids pending establishment of enteral feeds, antibiotics for possible sepsis, temperature support, and monitoring.   We discussed other common problems associated with prematurity including respiratory distress syndrome/CLD, feeding issues, temperature regulation, and infection risk. I also discussed the potential risk of complications such as intracranial hemorrhage, retinopathy, chronic lung disease, and a range of long term developmental delays, and how these risks decrease with each additional week of gestation.  We discussed the importance of good nutrition and various methods of providing nutrition (parenteral  hyperalimentation, gavage feedings and/or oral feeding). We discussed the benefits of human milk. I encouraged breast feeding and pumping soon after birth and outlined resources that are available to support breast feeding. We discussed the possibility of using donor breast milk as a bridge, and she expressed the desire to use DBM if needed to supplement MBM. She had difficulty with supply after prior deliveries and needed preterm formula for her youngest, who was growth restricted, thus she had been planning to use formula, but was willing to try to express breastmilk or use donor milk in the setting of an extremely preterm infant in an effort to decrease risk of NEC.  We discussed the average length of stay but I noted that the actual LOS would depend on the severity of problems encountered and response to treatments. We discussed visitation policies and the resources available while her child is in the hospital.  She expressed understanding and agreement with the plan for resuscitation and intensive care. All questions were answered.  Thank you for involving [place] in the care of this patient. A member of our team will be available should the family have additional questions.   Lucillie Garfinkel, MD Neonatal Medicine  I spent ~40 minutes in consultation time, of which 25 minutes was spent in direct face to face counseling.

## 2023-04-14 DIAGNOSIS — O4412 Placenta previa with hemorrhage, second trimester: Secondary | ICD-10-CM

## 2023-04-14 DIAGNOSIS — Z3A24 24 weeks gestation of pregnancy: Secondary | ICD-10-CM | POA: Diagnosis not present

## 2023-04-14 NOTE — Progress Notes (Signed)
 FACULTY PRACTICE ANTEPARTUM COMPREHENSIVE PROGRESS NOTE  April James is a 33 y.o. [id number] at [redacted]w[redacted]d with placenta previa who is admitted for sentinel bleed.  Estimated Date of Delivery: 07/29/23  Length of Stay:  2 Days. Admitted 04/12/2023  Subjective: Much improved this morning. Bleeding is now gone. No pain, LOF. Good fetal movement.  Is worried about length of hospitalization. Reports she can stay until Sunday if still without bleeding.    Vitals:  Blood pressure (!) 147/63, pulse 88, temperature 97.6 F (36.4 C), temperature source Oral, resp. rate 18, height 5\' 3"  (1.6 m), weight 103 kg, last menstrual period 10/10/2022, SpO2 98%, not currently breastfeeding. Physical Examination: CONSTITUTIONAL: Well-developed, well-nourished female in no acute distress.  CARDIOVASCULAR: Normal heart rate noted RESPIRATORY: Effort normal, no problems with respiration noted ABDOMEN: Soft, nontender, nondistended, gravid.  Fetal monitoring: FHR: 150 bpm, Variability: moderate, Accelerations: Absent, Decelerations: Absent. Appropriate for GA.  Uterine activity: flat  No results found for this or any previous visit (from the past 48 hours).   No results found.  Current scheduled medications  ferrous sulfate  325 mg Oral QODAY   loratadine  10 mg Oral Daily   prenatal multivitamin  1 tablet Oral Q1200   senna-docusate  2 tablet Oral QHS   sodium chloride flush  3-10 mL Intravenous Q12H    I have reviewed the patient's current medications.  ASSESSMENT: Principal Problem:   Placenta previa with bleeding, second trimester Active Problems:   Supervision of high risk pregnancy, antepartum   Maternal morbid obesity, antepartum (HCC)   History of severe preeclampsia in prior pregnancy, currently pregnant   Anxiety during pregnancy   Placenta previa antepartum, second trimester   Gastroenteritis   PLAN: S/p BMZ 3/12-13 (0231) Mag eligible NICU consult completed @23 /2 571g (37%), AC  39% CS consent previously signed BID NST  Continue routine antenatal care. Discussed inpatient management for ~5 days pending clinical course. Reviewed if stable without bleeding on Sunday (and no other indication), d/c would be acceptable.   Milas Hock, MD Obstetrician & Gynecologist, [facility] for [facility], [facility]

## 2023-04-15 DIAGNOSIS — O99342 Other mental disorders complicating pregnancy, second trimester: Secondary | ICD-10-CM

## 2023-04-15 DIAGNOSIS — F419 Anxiety disorder, unspecified: Secondary | ICD-10-CM

## 2023-04-15 DIAGNOSIS — Z3A25 25 weeks gestation of pregnancy: Secondary | ICD-10-CM

## 2023-04-15 LAB — TYPE AND SCREEN
ABO/RH(D): A POS
Antibody Screen: NEGATIVE

## 2023-04-15 LAB — CBC
HCT: 33.7 % — ABNORMAL LOW (ref 36.0–46.0)
Hemoglobin: 11.5 g/dL — ABNORMAL LOW (ref 12.0–15.0)
MCH: 31.2 pg (ref 26.0–34.0)
MCHC: 34.1 g/dL (ref 30.0–36.0)
MCV: 91.3 fL (ref 80.0–100.0)
Platelets: 384 10*3/uL (ref 150–400)
RBC: 3.69 MIL/uL — ABNORMAL LOW (ref 3.87–5.11)
RDW: 12.8 % (ref 11.5–15.5)
WBC: 14 10*3/uL — ABNORMAL HIGH (ref 4.0–10.5)
nRBC: 0 % (ref 0.0–0.2)

## 2023-04-15 MED ORDER — HYDROXYZINE HCL 25 MG PO TABS: 25.0000 mg | ORAL_TABLET | Freq: Four times a day (QID) | ORAL | 3 refills | Status: AC | PRN

## 2023-04-15 NOTE — Discharge Summary (Signed)
 Antenatal Physician Discharge Summary  Patient ID: April James MRN: [id number] DOB/AGE: 08/24/1990 33 y.o.  Admit date: 04/12/2023 Discharge date: 04/15/2023  Admission Diagnoses: Principal Problem:   Placenta previa with bleeding, second trimester Active Problems:   Supervision of high risk pregnancy, antepartum   Maternal morbid obesity, antepartum (HCC)   History of severe preeclampsia in prior pregnancy, currently pregnant   Anxiety during pregnancy   Placenta previa antepartum, second trimester   Gastroenteritis   [redacted] weeks gestation of pregnancy  Discharge Diagnoses:  The same  Prenatal Procedures: NST and ultrasound  Consults: Neonatology  Hospital Course:  April James is a 33 y.o. [id number] with IUP at [redacted]w[redacted]d admitted for first episode of vaginal bleeding, in the setting of known posterior placenta previa.  Had gastroenteritis symptoms for a couple of days, then had a small amount of bleeding on 04/12/2023.  She had no contractions, no leaking of fluid and no bleeding.  She received betamethasone x 2 doses.  She was seen by Neonatology during her stay.  She was observed, had no further bleeding.  The fetal heart rate monitoring remained reassuring, and she had no signs/symptoms of progressing preterm labor or other maternal-fetal concerns.  She was deemed stable for discharge to home with outpatient follow up.  Discharge Exam: Temp:  [97.5 F (36.4 C)-98.2 F (36.8 C)] 98.2 F (36.8 C) (03/15 1029) Pulse Rate:  [70-82] 76 (03/15 1029) Resp:  [16-18] 16 (03/15 1029) BP: (120-135)/(63-75) 135/64 (03/15 1029) SpO2:  [95 %-98 %] 98 % (03/15 1029) Physical Examination: CONSTITUTIONAL: Well-developed, well-nourished female in no acute distress.  HENT:  Normocephalic, atraumatic, External right and left ear normal.  EYES: Conjunctivae and EOM are normal. Pupils are equal, round, and reactive to light. No scleral icterus.  NECK: Normal range of motion, supple, no masses SKIN:  Skin is warm and dry. No rash noted. Not diaphoretic. No erythema. No pallor. NEUROLOGIC: Alert and oriented to person, place, and time. Normal reflexes, muscle tone coordination. No cranial nerve deficit noted. PSYCHIATRIC: Normal mood and affect. Normal behavior. Normal judgment and thought content. CARDIOVASCULAR: Normal heart rate noted, regular rhythm RESPIRATORY: Effort and breath sounds normal, no problems with respiration noted MUSCULOSKELETAL: Normal range of motion. No edema and no tenderness. 2+ distal pulses. ABDOMEN: Soft, nontender, nondistended, gravid. CERVIX:    Fetal monitoring: FHR: 125 bpm, Variability: moderate, Accelerations: Present, Decelerations: mild variable decels consistent with gestational age >> reassuring Uterine activity: No contractions  Significant Diagnostic Studies:  Results for orders placed or performed during the hospital encounter of 04/12/23 (from the past week)  CBC   Collection Time: 04/12/23  2:07 AM  Result Value Ref Range   WBC 13.9 (H) 4.0 - 10.5 K/uL   RBC 3.76 (L) 3.87 - 5.11 MIL/uL   Hemoglobin 11.9 (L) 12.0 - 15.0 g/dL   HCT 14.7 (L) 82.9 - 56.2 %   MCV 92.8 80.0 - 100.0 fL   MCH 31.6 26.0 - 34.0 pg   MCHC 34.1 30.0 - 36.0 g/dL   RDW 13.0 86.5 - 78.4 %   Platelets 422 (H) 150 - 400 K/uL   nRBC 0.0 0.0 - 0.2 %  Type and screen [facility]   Collection Time: 04/12/23  2:07 AM  Result Value Ref Range   ABO/RH(D) A POS    Antibody Screen NEG    Sample Expiration      04/15/2023,2359 Performed at [facility], [address], [place], [place] [address]  Type and screen [facility]   Collection Time: 04/15/23  4:36 AM  Result Value Ref Range   ABO/RH(D) A POS    Antibody Screen NEG    Sample Expiration      04/18/2023,2359 Performed at [facility], [address], [place], [place] [address]   CBC   Collection Time: 04/15/23  4:37 AM  Result Value Ref Range   WBC 14.0 (H) 4.0 -  10.5 K/uL   RBC 3.69 (L) 3.87 - 5.11 MIL/uL   Hemoglobin 11.5 (L) 12.0 - 15.0 g/dL   HCT 98.1 (L) 19.1 - 47.8 %   MCV 91.3 80.0 - 100.0 fL   MCH 31.2 26.0 - 34.0 pg   MCHC 34.1 30.0 - 36.0 g/dL   RDW 29.5 62.1 - 30.8 %   Platelets 384 150 - 400 K/uL   nRBC 0.0 0.0 - 0.2 %   [place] MFM OB DETAIL +14 WK Result Date: 04/03/2023 ----------------------------------------------------------------------  OBSTETRICS REPORT                       (Signed Final 04/03/2023 12:40 pm) ---------------------------------------------------------------------- Patient Info  ID #:       [id number]                          D.O.B.:  09/29/1990 (33 yrs)(F)  Name:       April James                   Visit Date: 04/03/2023 09:26 am ---------------------------------------------------------------------- Performed By  Attending:        Noralee Space MD        Ref. Address:     [address]                                                             [place], [place]                                                             [address]  Performed By:     Dennis Bast RDMS      [facility]  Referred By:      Tereso Newcomer MD ---------------------------------------------------------------------- Orders  #  Description                           Code        Ordered By  1  [place] MFM OB DETAIL +14 WK               76811.01    Jaynie Collins ----------------------------------------------------------------------  #  Order #                     Accession #                Episode #  1  [id number]                   [id number]                 [id number] ---------------------------------------------------------------------- Indications  Short interval between pregnancies, 2nd        O09.892  trimester  Obesity  complicating pregnancy, second         O99.212  trimester (BMI 40)  Placenta previa specified as without           O44.02  hemorrhage, second trimester  Poor obstetric history: Previous               O09.299  preeclampsia / eclampsia/gestational HTN  Poor obstetric history: Previous fetal growth  O09.299  restriction (FGR)  Tobacco use complicating pregnancy,            O99.332  second trimester(pt states quit)  Medical complication of pregnancy (anxiety)    O26.90  Encounter for antenatal screening for          Z36.3  malformations  [redacted] weeks gestation of pregnancy                Z3A.23 ---------------------------------------------------------------------- Vital Signs  BP:          122/57 ---------------------------------------------------------------------- Fetal Evaluation  Num Of Fetuses:         1  Fetal Heart Rate(bpm):  125  Cardiac Activity:       Observed  Presentation:           Cephalic  Placenta:               Posterior previa  P. Cord Insertion:      Visualized  Amniotic Fluid  AFI FV:      Within normal limits                              Largest Pocket(cm)                              5.94 ---------------------------------------------------------------------- Biometry  BPD:      57.9  mm     G. Age:  23w 5d         62  %    CI:        74.76   %    70 - 86  FL/HC:      19.0   %    19.2 - 20.8  HC:      212.5  mm     G. Age:  23w 2d         35  %    HC/AC:      1.15        1.05 - 1.21  AC:      184.2  mm     G. Age:  23w 2d         39  %    FL/BPD:     69.6   %    71 - 87  FL:       40.3  mm     G. Age:  23w 0d         30  %    FL/AC:      21.9   %    20 - 24  HUM:      38.8  mm     G. Age:  23w 6d         53  %  CER:      27.3  mm     G. Age:  24w 3d         93  %  LV:        3.8  mm  CM:        5.8  mm  Est. FW:     571  gm      1 lb 4 oz     37  % ---------------------------------------------------------------------- OB History  Gravidity:    5          Term:   2         SAB:   2  Living:       2 ---------------------------------------------------------------------- Gestational Age  LMP:           25w 0d        Date:  10/10/22                   EDD:   07/17/23  U/S Today:     23w 2d                                        EDD:   07/29/23  Best:          23w 2d     Det. By:  U/S (04/03/23)           EDD:   07/29/23 ---------------------------------------------------------------------- Targeted Anatomy  Central Nervous System  Calvarium/Cranial V.:  Appears normal         Cereb./Vermis:          Appears normal  Cavum:                 Appears normal         Cisterna Magna:         Appears normal  Lateral Ventricles:    Appears normal         Midline Falx:           Appears normal  Choroid Plexus:        Appears normal  Spine  Cervical:              Not well visualized    Sacral:  Not well visualized  Thoracic:              Not well visualized    Shape/Curvature:        Not well visualized  Lumbar:                Not well visualized  Head/Neck  Lips:                  Appears normal         Profile:                Appears normal  Neck:                  Appears normal         Orbits/Eyes:            Appears normal  Nuchal Fold:           Appears normal         Mandible:               Appears normal  Nasal Bone:            Present                Maxilla:                Appears normal  Thorax  4 Chamber View:        Appears normal         Interventr. Septum:     Not well visualized  Cardiac Rhythm:        Normal                 Cardiac Axis:           Normal  Cardiac Situs:         Appears normal         Diaphragm:              Appears normal  Rt Outflow Tract:      Appears normal         3 Vessel View:          Appears normal  Lt Outflow Tract:      Appears normal         3 V Trachea View:       Appears normal  Aortic Arch:           Not well visualized    IVC:                    Not well visualized  Ductal Arch:           Appears normal         Crossing:                Appears normal  SVC:                   Not well visualized  Abdomen  Ventral Wall:          Appears normal         Lt Kidney:              Appears normal  Cord Insertion:        Appears normal         Rt Kidney:              Appears normal  Situs:  Appears normal         Bladder:                Appears normal  Stomach:               Appears normal  Extremities  Lt Humerus:            Appears normal         Lt Femur:               Appears normal  Rt Humerus:            Appears normal         Rt Femur:               Appears normal  Lt Forearm:            Appears normal         Lt Lower Leg:           Appears normal  Rt Forearm:            Appears normal         Rt Lower Leg:           Appears normal  Lt Hand:               Open hand nml          Lt Foot:                Nml heel/foot  Rt Hand:               Open hand nml          Rt Foot:                Nml heel/foot  Other  Umbilical Cord:        Normal 3-vessel        Genitalia:              Female-nml ---------------------------------------------------------------------- Doppler - Fetal Vessels  Umbilical Artery   S/D     %tile      RI    %tile                      PSV    ADFV    RDFV                                                     (cm/s)   3.23       33    0.69       36                     34.63      No      No ---------------------------------------------------------------------- Cervix Uterus Adnexa  Cervix  Length:            3.7  cm.  Normal appearance by transabdominal scan  Uterus  No abnormality visualized.  Right Ovary  Size(cm)     2.54   x   1.48   x  1.48      Vol(ml): 2.91  Within normal limits.  Left Ovary  Not visualized.  Cul De Sac  No free fluid seen.  Adnexa  No abnormality visualized ---------------------------------------------------------------------- Impression  On today's ultrasound, fetal biometry  is consistent with 23  weeks and 2 days gestation.  Amniotic fluid is normal and  good fetal activity seen.  No  markers of aneuploidies or  obvious fetal structural defects are seen.  Placenta previa is seen.  xxxxxxxxxxxxxxxxxxxxxxxxxxxxxxxxxxxxxxxxxxxxxxxxxxxxxxxxx  x  Maternal-Fetal Medicine Consultation  I had the pleasure of seeing Ms. April [facility]. She is G5 P2022 at 16-  weeks' gestation (by unsure LMP) and is here for fetal  anatomy scan.  Her high risk pregnancy problems include  -Short interval between pregnancies.  -History of fetal growth restriction in the second pregnancy.  -History of preeclampsia in her second pregnancy.  -Pregravid BMI 40.  Obstetric history  -2010: Term vaginal delivery of a female infant weighing 7  pounds and 13 ounces at birth.  -2024: Term vaginal delivery of her female infant weighing 4  pounds and 15 ounces at birth.  Her pregnancy was  complicated by fetal growth restriction.  The newborn was  discharged with his mother.  Both her children are in good health  Prenatal: Her pregnancy is dated by female length  measurement (16 weeks).  No other biometry was performed.  On cell-free fetal DNA screening, the risks of fetal  aneuploidies are not increased.  Patient gives history of vaping in this pregnancy.  Our concerns include:  Pregnancy dating  Her pregnancy was suboptimally dated by femoral length  measurement.  Complete biometry was not performed.  I  counseled the patient that we should date her pregnancy by  today's ultrasound measurements.  We have assigned her EDD at 07/29/2023.  History of fetal growth restriction.  Recurrence of fetal growth restriction is seen in 20% to 30%  of pregnancies.  We recommend serial fetal growth  assessments to rule out fetal growth restriction.  History of preeclampsia  She is pregnant with the same father.  Recurrence of  preeclampsia seen and 25% to 50% of pregnancies.  Patient  takes low-dose aspirin prophylaxis.  I discussed the benefit of  low-dose aspirin prophylaxis that delays or prevents   preeclampsia.  Placenta previa  Plan the findings with the help of ultrasound images.  In some  cases, placenta previa resolves with advancing gestation.  Patient does not give history of vaginal bleeding.  I counseled the patient that if placenta previa persists, we will  recommend delivery at [redacted] weeks gestation or at [redacted] weeks  gestation with bleeding.  I advised her to practice abstinence from sexual intercourse.  I did not address vaping today. ---------------------------------------------------------------------- Recommendations  -An appointment was made for her to return in 4 weeks for  fetal growth assessment.  -Fetal growth assessments every 4 weeks.  -Weekly BPP from [redacted] weeks gestation till delivery (BMI>40). ----------------------------------------------------------------------                 Noralee Space, MD Electronically Signed Final Report   04/03/2023 12:40 pm ----------------------------------------------------------------------    Future Appointments  Date Time Provider Department Center  04/21/2023  2:40 PM Thomasene Ripple, DO [facility] None  05/01/2023 10:30 AM [facility] US4 [facility]  05/02/2023  8:35 AM [facility]  05/02/2023  8:55 AM Jannessa Ogden, Jethro Bastos, MD [facility]  05/16/2023 10:35 AM Selah Klang, Jethro Bastos, MD [facility]  05/30/2023  9:35 AM Zaeden Lastinger, Jethro Bastos, MD [facility]  06/13/2023 10:35 AM Huber Ridge Bing, MD [facility]  06/27/2023 10:35 AM Naima Veldhuizen, Jethro Bastos, MD [facility]   Discharge Condition: Stable  Discharge disposition: 01-Home or Self Care      Allergies as of 04/15/2023       Reactions   Penicillins Hives, Shortness Of Breath   Sulfa Antibiotics Rash        Medication List     TAKE these medications    aspirin EC 81 MG tablet Take 2 tablets (162 mg total) by mouth at bedtime. Start taking when you are [redacted] weeks pregnant for rest of pregnancy for prevention of preeclampsia    hydrOXYzine 25 MG tablet Commonly known as: ATARAX Take 1-2 tablets (25-50 mg total) by mouth every 6 (six) hours as needed for anxiety.   multivitamin-prenatal 27-0.8 MG Tabs tablet Take 1 tablet by mouth daily at 12 noon.   ondansetron 4 MG disintegrating tablet Commonly known as: ZOFRAN-ODT Take 1 tablet (4 mg total) by mouth every 6 (six) hours as needed for nausea.         Total discharge time: 20 minutes   Signed: Jaynie Collins M.D. 04/15/2023, 10:43 AM

## 2023-04-21 ENCOUNTER — Encounter: Payer: Self-pay | Admitting: Cardiology

## 2023-04-21 ENCOUNTER — Ambulatory Visit (INDEPENDENT_AMBULATORY_CARE_PROVIDER_SITE_OTHER): Payer: Medicaid Other | Admitting: Cardiology

## 2023-04-21 VITALS — BP 130/82 | HR 85 | Ht 62.0 in | Wt 224.0 lb

## 2023-04-21 DIAGNOSIS — O99330 Smoking (tobacco) complicating pregnancy, unspecified trimester: Secondary | ICD-10-CM | POA: Diagnosis not present

## 2023-04-21 DIAGNOSIS — Z3A25 25 weeks gestation of pregnancy: Secondary | ICD-10-CM

## 2023-04-21 DIAGNOSIS — O09299 Supervision of pregnancy with other poor reproductive or obstetric history, unspecified trimester: Secondary | ICD-10-CM | POA: Diagnosis not present

## 2023-04-21 DIAGNOSIS — F17200 Nicotine dependence, unspecified, uncomplicated: Secondary | ICD-10-CM

## 2023-04-21 NOTE — Patient Instructions (Addendum)
 Medication Instructions:  Your physician recommends that you continue on your current medications as directed. Please refer to the Current Medication list given to you today.  *If you need a refill on your cardiac medications before your next appointment, please call your pharmacy*   Follow-Up: At [facility], you and your health needs are our priority.  As part of our continuing mission to provide you with exceptional heart care, we have created designated Provider Care Teams.  These Care Teams include your primary Cardiologist (physician) and Advanced Practice Providers (APPs -  Physician Assistants and Nurse Practitioners) who all work together to provide you with the care you need, when you need it.   Your next appointment:   4 week(s)  Provider:   Thomasene Ripple, DO   Other Instructions Please take your blood pressure daily for 2 weeks and send in a MyChart message. Please include heart rates. (One message at the end of the 2 weeks).   HOW TO TAKE YOUR BLOOD PRESSURE: Rest 5 minutes before taking your blood pressure. Don't smoke or drink caffeinated beverages for at least 30 minutes before. Take your blood pressure before (not after) you eat. Sit comfortably with your back supported and both feet on the floor (don't cross your legs). Elevate your arm to heart level on a table or a desk. Use the proper sized cuff. It should fit smoothly and snugly around your bare upper arm. There should be enough room to slip a fingertip under the cuff. The bottom edge of the cuff should be 1 inch above the crease of the elbow. Ideally, take 3 measurements at one sitting and record the average.    Congratulations for your interest in quitting smoking!  Find a program that suits you best: when you want to quit, how you need support, where you live, and how you like to learn.    If you're ready to get started TODAY, consider scheduling a visit through [email or web address].   Appointments are available from 8am to 8pm, Monday to Friday.   Most health insurance plans will cover some level of tobacco cessation visits and medications.    Additional Resources: [employer] are also available to help you quit & provide the support you'll need. Many programs are available in both [place] and Spanish and have a long history of successfully helping people get off and stay off tobacco.    Quit Smoking Apps:  quitSTART at [employer].de QuitGuide?at [employer].dk Online education and resources: Smokefree  at [employer].gov Free Telephone Coaching: QuitNow,  Call 1-800-QUIT-NOW (912-182-6424) or Text- Ready to 804-262-6066 *[facility]uit[facility] [place] has teamed up with Medicaid to offer a free 14 week program    Vaping- Want to Quit? Free 24/7 support. Call [facility]  [place], [place], [place], [place], [place]  [facility]

## 2023-04-23 NOTE — Progress Notes (Signed)
 Cardio-Obstetrics Clinic  New Evaluation  Date:  04/23/2023   ID:  April James, DOB 1990/08/03, MRN [id number]  PCP:  Patient, No Pcp Per   April James Cardiologist:  None  Electrophysiologist:  None       Referring MD: Tereso Newcomer, MD   Chief Complaint: " I am   History of Present Illness:    April Ebarb is a 33 y.o. female [G5P2022] who is being seen today for the evaluation for cardiovascular care at the request of Anyanwu, Ugonna A, MD.   This is a 33 year old pregnant individual with a history of preeclampsia and hypertension in a previous pregnancy, presents for a routine prenatal visit at [redacted] weeks gestation. She reports not currently taking any medication for hypertension but does have a blood pressure cuff at home. She admits to not being consistent with monitoring her blood pressure at home but agrees to improve this practice. She was previously taking aspirin but stopped due to a recent episode of bleeding.  The patient has a significant family history of heart disease and hypertension, with her father having a heart attack at age 24. She is a former smoker and expresses concern about potentially restarting smoking after the pregnancy. She also reports dental issues that need to be addressed postpartum.  The patient's mother, who is present during the consultation, confirms the family history of heart disease and hypertension. She also provides additional information about other family members with heart disease.  Prior CV Studies Reviewed: The following studies were reviewed today:   Past Medical History:  Diagnosis Date   Asthma    Bipolar 1 disorder (HCC)    Chlamydia infection affecting pregnancy in second trimester 01/19/2022   Toc neg early feb 2024   Preeclampsia, severe 06/01/2022    Past Surgical History:  Procedure Laterality Date   CHOLECYSTECTOMY     TONSILLECTOMY        OB History     Gravida  5   Para  2   Term   2   Preterm  0   AB  2   Living  2      SAB  1   IAB  1   Ectopic  0   Multiple  0   Live Births  2               Current Medications: Current Meds  Medication Sig   hydrOXYzine (ATARAX) 25 MG tablet Take 1-2 tablets (25-50 mg total) by mouth every 6 (six) hours as needed for anxiety.   ondansetron (ZOFRAN-ODT) 4 MG disintegrating tablet Take 1 tablet (4 mg total) by mouth every 6 (six) hours as needed for nausea.   Prenatal Vit-Fe Fumarate-FA (MULTIVITAMIN-PRENATAL) 27-0.8 MG TABS tablet Take 1 tablet by mouth daily at 12 noon.     Allergies:   Penicillins and Sulfa antibiotics   Social History   Socioeconomic History   Marital status: Significant Other    Spouse name: Not on file   Number of children: Not on file   Years of education: Not on file   Highest education level: Not on file  Occupational History   Not on file  Tobacco Use   Smoking status: Former    Current packs/day: 0.20    Average packs/day: 0.2 packs/day for 16.0 years (3.2 ttl pk-yrs)    Types: Cigarettes    Passive exposure: Current   Smokeless tobacco: Never  Vaping Use   Vaping status: Former  Substances: Flavoring  Substance and Sexual Activity   Alcohol use: Not Currently    Comment: stopped for now   Drug use: Not Currently    Types: Marijuana    Comment: "Not very often". Last used: 2 months ago.    Sexual activity: Yes    Birth control/protection: None  Other Topics Concern   Not on file  Social History Narrative   Not on file   Social Drivers of Health   Financial Resource Strain: Not on file  Food Insecurity: No Food Insecurity (04/12/2023)   Hunger Vital Sign    Worried About Running Out of Food in the Last Year: Never true    Ran Out of Food in the Last Year: Never true  Transportation Needs: No Transportation Needs (04/12/2023)   PRAPARE - Administrator, Civil Service (Medical): No    Lack of Transportation (Non-Medical): No  Physical Activity:  Not on file  Stress: Not on file  Social Connections: Not on file      Family History  Problem Relation Age of Onset   Bipolar disorder Father    Heart disease Father    Aneurysm Father    Asthma Son    Diabetes Maternal Grandfather    Diabetes Paternal Grandmother    Cancer Paternal Grandmother    Hypertension Paternal Grandfather    Stroke Neg Hx       ROS:   Please see the history of present illness.     All other systems reviewed and are negative.   Labs/EKG Reviewed:    EKG:     Recent Labs: 06/01/2022: Magnesium 1.8 03/09/2023: ALT 7; BUN 9; Creatinine, Ser 0.60; Potassium 4.6; Sodium 136; TSH 0.967 04/15/2023: Hemoglobin 11.5; Platelets 384   Recent Lipid Panel No results found for: "CHOL", "TRIG", "HDL", "CHOLHDL", "LDLCALC", "LDLDIRECT"  Physical Exam:    VS:  BP 130/82   Pulse 85   Ht 5\' 2"  (1.575 m)   Wt 224 lb (101.6 kg)   LMP 10/10/2022 (Approximate)   SpO2 96%   BMI 40.97 kg/m     Wt Readings from Last 3 Encounters:  04/21/23 224 lb (101.6 kg)  04/12/23 227 lb (103 kg)  04/04/23 230 lb (104.3 kg)     GEN:  Well nourished, well developed in no acute distress HEENT: Normal NECK: No JVD; No carotid bruits LYMPHATICS: No lymphadenopathy CARDIAC: RRR, no murmurs, rubs, gallops RESPIRATORY:  Clear to auscultation without rales, wheezing or rhonchi  ABDOMEN: Soft, non-tender, non-distended MUSCULOSKELETAL:  No edema; No deformity  SKIN: Warm and dry NEUROLOGIC:  Alert and oriented x 3 PSYCHIATRIC:  Normal affect    Risk Assessment/Risk Calculators:     CARPREG II Risk Prediction Index Score:  1.  The patient's risk for a primary cardiac event is 5%.            ASSESSMENT & PLAN:    Hx of Hypertension in pregnancy [redacted] weeks pregnant with history of postpartum preeclampsia. Current BP 134/80 mmHg. Family history increases risk. Aspirin therapy pending obstetrician clearance. Nifedipine considered if needed. - Monitor BP twice daily,  report if >140/90 mmHg for 3 days. - Resume aspirin if obstetrician clears. - Consider nifedipine if BP elevated.  Smoking cessation Stopped smoking during pregnancy, interested in quitting permanently. High cardiovascular risk due to family history and smoking. - Refer to digital smoking cessation program for telehealth support and pharmacotherapy.  Family history of cardiovascular disease Significant family history, high risk for future cardiovascular  events. Plan to assess cardiovascular risk postpartum. - Check lipid profile and other risk factors six months postpartum.  Follow-up Requires close monitoring due to pregnancy and associated risks. - Schedule follow-up in four weeks. - Review BP readings in two weeks.   Patient Instructions  Medication Instructions:  Your physician recommends that you continue on your current medications as directed. Please refer to the Current Medication list given to you today.  *If you need a refill on your cardiac medications before your next appointment, please call your pharmacy*   Follow-Up: At [facility], you and your health needs are our priority.  As part of our continuing mission to provide you with exceptional heart care, we have created designated Provider Care Teams.  These Care Teams include your primary Cardiologist (physician) and Advanced Practice James (APPs -  Physician Assistants and Nurse Practitioners) who all work together to provide you with the care you need, when you need it.   Your next appointment:   4 week(s)  Provider:   Thomasene Ripple, DO   Other Instructions Please take your blood pressure daily for 2 weeks and send in a MyChart message. Please include heart rates. (One message at the end of the 2 weeks).   HOW TO TAKE YOUR BLOOD PRESSURE: Rest 5 minutes before taking your blood pressure. Don't smoke or drink caffeinated beverages for at least 30 minutes before. Take your blood pressure before (not  after) you eat. Sit comfortably with your back supported and both feet on the floor (don't cross your legs). Elevate your arm to heart level on a table or a desk. Use the proper sized cuff. It should fit smoothly and snugly around your bare upper arm. There should be enough room to slip a fingertip under the cuff. The bottom edge of the cuff should be 1 inch above the crease of the elbow. Ideally, take 3 measurements at one sitting and record the average.    Congratulations for your interest in quitting smoking!  Find a program that suits you best: when you want to quit, how you need support, where you live, and how you like to learn.    If you're ready to get started TODAY, consider scheduling a visit through [email or web address].  Appointments are available from 8am to 8pm, Monday to Friday.   Most health insurance plans will cover some level of tobacco cessation visits and medications.    Additional Resources: [employer] are also available to help you quit & provide the support you'll need. Many programs are available in both [place] and Spanish and have a long history of successfully helping people get off and stay off tobacco.    Quit Smoking Apps:  quitSTART at [employer]Broker.de QuitGuide?at ForgetParking.dk Online education and resources: Smokefree  at [employer].gov Free Telephone Coaching: QuitNow,  Call 1-800-QUIT-NOW (4378588758) or Text- Ready to 915 379 8001 *[facility]uit[facility] [place] has teamed up with Medicaid to offer a free 14 week program    Vaping- Want to Quit? Free 24/7 support. Call [facility]  [place], [place], [place], [place], [place]  [place]      Dispo:  No follow-ups on file.   Medication Adjustments/Labs and Tests Ordered: Current medicines are reviewed at length with the patient today.  Concerns regarding medicines are outlined above.  Tests Ordered: Orders Placed This Encounter   Procedures   Ambulatory referral to Virtual Care Smoking Cessation   Medication Changes: No orders of the defined types were placed in this encounter.

## 2023-04-26 ENCOUNTER — Encounter: Payer: Self-pay | Admitting: Cardiology

## 2023-04-26 MED ORDER — NIFEDIPINE ER OSMOTIC RELEASE 30 MG PO TB24: 30.0000 mg | ORAL_TABLET | Freq: Every day | ORAL | 3 refills | Status: DC

## 2023-05-01 ENCOUNTER — Ambulatory Visit

## 2023-05-02 ENCOUNTER — Encounter: Payer: Self-pay | Admitting: Obstetrics & Gynecology

## 2023-05-02 ENCOUNTER — Ambulatory Visit: Admitting: Obstetrics & Gynecology

## 2023-05-02 ENCOUNTER — Other Ambulatory Visit

## 2023-05-02 ENCOUNTER — Encounter: Payer: Medicaid Other | Admitting: Obstetrics & Gynecology

## 2023-05-02 VITALS — BP 127/87 | HR 86 | Wt 225.0 lb

## 2023-05-02 DIAGNOSIS — O0990 Supervision of high risk pregnancy, unspecified, unspecified trimester: Secondary | ICD-10-CM | POA: Diagnosis not present

## 2023-05-02 DIAGNOSIS — O09299 Supervision of pregnancy with other poor reproductive or obstetric history, unspecified trimester: Secondary | ICD-10-CM

## 2023-05-02 DIAGNOSIS — Z8759 Personal history of other complications of pregnancy, childbirth and the puerperium: Secondary | ICD-10-CM | POA: Diagnosis not present

## 2023-05-02 DIAGNOSIS — Z3A27 27 weeks gestation of pregnancy: Secondary | ICD-10-CM | POA: Diagnosis not present

## 2023-05-02 DIAGNOSIS — O4402 Placenta previa specified as without hemorrhage, second trimester: Secondary | ICD-10-CM

## 2023-05-02 DIAGNOSIS — Z23 Encounter for immunization: Secondary | ICD-10-CM

## 2023-05-02 DIAGNOSIS — O09292 Supervision of pregnancy with other poor reproductive or obstetric history, second trimester: Secondary | ICD-10-CM | POA: Diagnosis not present

## 2023-05-02 NOTE — Progress Notes (Signed)
 PRENATAL VISIT NOTE  Subjective:  April James is a 33 y.o. G9F6213 at [redacted]w[redacted]d being seen today for ongoing prenatal care.  She is currently monitored for the following issues for this high-risk pregnancy and has Alcohol use disorder, mild, abuse; Supervision of high risk pregnancy, antepartum; Tobacco use affecting pregnancy, antepartum; Maternal morbid obesity, antepartum (HCC); Bipolar disease in pregnancy (HCC); History of severe preeclampsia in prior pregnancy, currently pregnant; History of prior pregnancy with IUGR; Anxiety during pregnancy; Proteinuria during pregnancy; Placenta previa antepartum, second trimester; Placenta previa with bleeding, second trimester; and Gastroenteritis on their problem list.  Patient reports no complaints. No bleeding since she was discharged from hospital last month. Contractions: Not present. Vag. Bleeding: None.  Movement: Present. Denies leaking of fluid.   The following portions of the patient's history were reviewed and updated as appropriate: allergies, current medications, past family history, past medical history, past social history, past surgical history and problem list.   Objective:   Vitals:   05/02/23 0902  BP: 127/87  Pulse: 86  Weight: 225 lb (102.1 kg)    Fetal Status: Fetal Heart Rate (bpm): 140   Movement: Present     General:  Alert, oriented and cooperative. Patient is in no acute distress.  Skin: Skin is warm and dry. No rash noted.   Cardiovascular: Normal heart rate noted  Respiratory: Normal respiratory effort, no problems with respiration noted  Abdomen: Soft, gravid, appropriate for gestational age.  Pain/Pressure: Absent     Pelvic: Cervical exam deferred        Extremities: Normal range of motion.  Edema: None  Mental Status: Normal mood and affect. Normal behavior. Normal judgment and thought content.   [place] MFM OB DETAIL +14 WK Result Date:  04/03/2023 ----------------------------------------------------------------------  OBSTETRICS REPORT                       (Signed Final 04/03/2023 12:40 pm) ---------------------------------------------------------------------- Patient Info  ID #:       [id number]                          D.O.B.:  07-01-1990 (33 yrs)(F)  Name:       April Cragg                   Visit Date: 04/03/2023 09:26 am ---------------------------------------------------------------------- Performed By  Attending:        Noralee Space MD        Ref. Address:     [address]                                                             [place], [place]                                                             [address]  Performed By:     Dennis Bast RDMS      [facility]  Referred By:      Tereso Newcomer MD ---------------------------------------------------------------------- Orders  #  Description                           Code        Ordered By  1  [place] MFM OB DETAIL +14 WK               16109.60    Jaynie Collins ----------------------------------------------------------------------  #  Order #                     Accession #                Episode #  1  [id number]                   [id number]                 [id number] ---------------------------------------------------------------------- Indications  Short interval between pregnancies, 2nd        O09.892  trimester  Obesity complicating pregnancy, second         O99.212  trimester (BMI 40)  Placenta previa specified as without           O44.02  hemorrhage, second trimester  Poor obstetric history: Previous               O09.299  preeclampsia / eclampsia/gestational HTN  Poor obstetric history: Previous fetal growth  O09.299  restriction (FGR)   Tobacco use complicating pregnancy,            O99.332  second trimester(pt states quit)  Medical complication of pregnancy (anxiety)    O26.90  Encounter for antenatal screening for          Z36.3  malformations  [redacted] weeks gestation of pregnancy                Z3A.23 ---------------------------------------------------------------------- Vital Signs  BP:          122/57 ---------------------------------------------------------------------- Fetal Evaluation  Num Of Fetuses:         1  Fetal Heart Rate(bpm):  125  Cardiac Activity:       Observed  Presentation:           Cephalic  Placenta:               Posterior previa  P. Cord Insertion:      Visualized  Amniotic Fluid  AFI FV:      Within normal limits  Largest Pocket(cm)                              5.94 ---------------------------------------------------------------------- Biometry  BPD:      57.9  mm     G. Age:  23w 5d         62  %    CI:        74.76   %    70 - 86                                                          FL/HC:      19.0   %    19.2 - 20.8  HC:      212.5  mm     G. Age:  23w 2d         35  %    HC/AC:      1.15        1.05 - 1.21  AC:      184.2  mm     G. Age:  23w 2d         39  %    FL/BPD:     69.6   %    71 - 87  FL:       40.3  mm     G. Age:  23w 0d         30  %    FL/AC:      21.9   %    20 - 24  HUM:      38.8  mm     G. Age:  23w 6d         53  %  CER:      27.3  mm     G. Age:  24w 3d         93  %  LV:        3.8  mm  CM:        5.8  mm  Est. FW:     571  gm      1 lb 4 oz     37  % ---------------------------------------------------------------------- OB History  Gravidity:    5         Term:   2         SAB:   2  Living:       2 ---------------------------------------------------------------------- Gestational Age  LMP:           25w 0d        Date:  10/10/22                   EDD:   07/17/23  U/S Today:     23w 2d                                        EDD:   07/29/23  Best:          23w 2d     Det.  By:  U/S (04/03/23)           EDD:   07/29/23 ---------------------------------------------------------------------- Targeted Anatomy  Central Nervous System  Calvarium/Cranial V.:  Appears normal         Cereb./Vermis:          Appears normal  Cavum:                 Appears normal         Cisterna Magna:         Appears normal  Lateral Ventricles:    Appears normal         Midline Falx:           Appears normal  Choroid Plexus:        Appears normal  Spine  Cervical:              Not well visualized    Sacral:                 Not well visualized  Thoracic:              Not well visualized    Shape/Curvature:        Not well visualized  Lumbar:                Not well visualized  Head/Neck  Lips:                  Appears normal         Profile:                Appears normal  Neck:                  Appears normal         Orbits/Eyes:            Appears normal  Nuchal Fold:           Appears normal         Mandible:               Appears normal  Nasal Bone:            Present                Maxilla:                Appears normal  Thorax  4 Chamber View:        Appears normal         Interventr. Septum:     Not well visualized  Cardiac Rhythm:        Normal                 Cardiac Axis:           Normal  Cardiac Situs:         Appears normal         Diaphragm:              Appears normal  Rt Outflow Tract:      Appears normal         3 Vessel View:          Appears normal  Lt Outflow Tract:      Appears normal         3 V Trachea View:       Appears normal  Aortic Arch:           Not well visualized    IVC:                    Not well  visualized  Ductal Arch:           Appears normal         Crossing:               Appears normal  SVC:                   Not well visualized  Abdomen  Ventral Wall:          Appears normal         Lt Kidney:              Appears normal  Cord Insertion:        Appears normal         Rt Kidney:              Appears normal  Situs:                 Appears normal         Bladder:                 Appears normal  Stomach:               Appears normal  Extremities  Lt Humerus:            Appears normal         Lt Femur:               Appears normal  Rt Humerus:            Appears normal         Rt Femur:               Appears normal  Lt Forearm:            Appears normal         Lt Lower Leg:           Appears normal  Rt Forearm:            Appears normal         Rt Lower Leg:           Appears normal  Lt Hand:               Open hand nml          Lt Foot:                Nml heel/foot  Rt Hand:               Open hand nml          Rt Foot:                Nml heel/foot  Other  Umbilical Cord:        Normal 3-vessel        Genitalia:              Female-nml ---------------------------------------------------------------------- Doppler - Fetal Vessels  Umbilical Artery   S/D     %tile      RI    %tile                      PSV    ADFV    RDFV                                                     (  cm/s)   3.23       33    0.69       36                     34.63      No      No ---------------------------------------------------------------------- Cervix Uterus Adnexa  Cervix  Length:            3.7  cm.  Normal appearance by transabdominal scan  Uterus  No abnormality visualized.  Right Ovary  Size(cm)     2.54   x   1.48   x  1.48      Vol(ml): 2.91  Within normal limits.  Left Ovary  Not visualized.  Cul De Sac  No free fluid seen.  Adnexa  No abnormality visualized ---------------------------------------------------------------------- Impression  On today's ultrasound, fetal biometry is consistent with 23  weeks and 2 days gestation.  Amniotic fluid is normal and  good fetal activity seen.  No markers of aneuploidies or  obvious fetal structural defects are seen.  Placenta previa is seen.  xxxxxxxxxxxxxxxxxxxxxxxxxxxxxxxxxxxxxxxxxxxxxxxxxxxxxxxxx  x  Maternal-Fetal Medicine Consultation  I had the pleasure of seeing Ms. April Kreiter today at [facility]. She is G5 P2022 at 67-  weeks'  gestation (by unsure LMP) and is here for fetal  anatomy scan.  Her high risk pregnancy problems include  -Short interval between pregnancies.  -History of fetal growth restriction in the second pregnancy.  -History of preeclampsia in her second pregnancy.  -Pregravid BMI 40.  Obstetric history  -2010: Term vaginal delivery of a female infant weighing 7  pounds and 13 ounces at birth.  -2024: Term vaginal delivery of her female infant weighing 4  pounds and 15 ounces at birth.  Her pregnancy was  complicated by fetal growth restriction.  The newborn was  discharged with his mother.  Both her children are in good health  Prenatal: Her pregnancy is dated by female length  measurement (16 weeks).  No other biometry was performed.  On cell-free fetal DNA screening, the risks of fetal  aneuploidies are not increased.  Patient gives history of vaping in this pregnancy.  Our concerns include:  Pregnancy dating  Her pregnancy was suboptimally dated by femoral length  measurement.  Complete biometry was not performed.  I  counseled the patient that we should date her pregnancy by  today's ultrasound measurements.  We have assigned her EDD at 07/29/2023.  History of fetal growth restriction.  Recurrence of fetal growth restriction is seen in 20% to 30%  of pregnancies.  We recommend serial fetal growth  assessments to rule out fetal growth restriction.  History of preeclampsia  She is pregnant with the same father.  Recurrence of  preeclampsia seen and 25% to 50% of pregnancies.  Patient  takes low-dose aspirin prophylaxis.  I discussed the benefit of  low-dose aspirin prophylaxis that delays or prevents  preeclampsia.  Placenta previa  Plan the findings with the help of ultrasound images.  In some  cases, placenta previa resolves with advancing gestation.  Patient does not give history of vaginal bleeding.  I counseled the patient that if placenta previa persists, we will  recommend delivery at [redacted] weeks gestation or at [redacted] weeks   gestation with bleeding.  I advised her to practice abstinence from sexual intercourse.  I did not address vaping today. ---------------------------------------------------------------------- Recommendations  -An appointment was made for her to return  in 4 weeks for  fetal growth assessment.  -Fetal growth assessments every 4 weeks.  -Weekly BPP from [redacted] weeks gestation till delivery (BMI>40). ----------------------------------------------------------------------                 Noralee Space, MD Electronically Signed Final Report   04/03/2023 12:40 pm ----------------------------------------------------------------------    Assessment and Plan:  Pregnancy: [id number] at [redacted]w[redacted]d 1. Placenta previa antepartum, second trimester (Primary) Bleeding precautions reviewed. If still present, will need cesarean section ~ 37 weeks  2. History of severe preeclampsia in prior pregnancy, currently pregnant Stable BP  3. History of prior pregnancy with IUGR Stable fetal growth  4. Need for Tdap vaccination Given today  5. [redacted] weeks gestation of pregnancy 6. Supervision of high risk pregnancy, antepartum GTT and labs checked today. Third trimester expectations reviewed and all questions answered. Preterm labor symptoms and general obstetric precautions including but not limited to vaginal bleeding, contractions, leaking of fluid and fetal movement were reviewed in detail with the patient. Please refer to After Visit Summary for other counseling recommendations.   Return in about 2 weeks (around 05/16/2023) for OFFICE OB VISIT (MD only).  Future Appointments  Date Time Provider Department Center  05/16/2023 10:35 AM Macon Large, Jethro Bastos, MD [facility]  05/22/2023  7:00 AM [facility] PROVIDER 1 [facility]  05/22/2023  7:30 AM [facility] US5 [facility]  05/30/2023  9:35 AM Tallia Moehring, Jethro Bastos, MD [facility]  06/13/2023 10:35 AM Santee Bing, MD [facility]  06/27/2023 10:35 AM Darol Cush,  Jethro Bastos, MD [facility]  07/04/2023 10:35 AM Jolayne Panther, Gigi Gin, MD [facility]  07/11/2023 10:35 AM Reva Bores, MD [facility]  07/18/2023 10:35 AM Lennart Pall, MD [facility]  07/25/2023 10:35 AM Reva Bores, MD [facility]    Jaynie Collins, MD

## 2023-05-02 NOTE — Addendum Note (Signed)
 Addended by: Kennon Portela on: 05/02/2023 09:25 AM   Modules accepted: Orders

## 2023-05-02 NOTE — Patient Instructions (Signed)

## 2023-05-03 ENCOUNTER — Other Ambulatory Visit: Payer: Self-pay | Admitting: *Deleted

## 2023-05-03 ENCOUNTER — Encounter: Payer: Self-pay | Admitting: Obstetrics & Gynecology

## 2023-05-03 DIAGNOSIS — O24410 Gestational diabetes mellitus in pregnancy, diet controlled: Secondary | ICD-10-CM

## 2023-05-03 LAB — GLUCOSE TOLERANCE, 2 HOURS W/ 1HR
Glucose, 1 hour: 137 mg/dL (ref 70–179)
Glucose, 2 hour: 87 mg/dL (ref 70–152)
Glucose, Fasting: 98 mg/dL — ABNORMAL HIGH (ref 70–91)

## 2023-05-03 LAB — CBC
Hematocrit: 35.9 % (ref 34.0–46.6)
Hemoglobin: 12.2 g/dL (ref 11.1–15.9)
MCH: 31.9 pg (ref 26.6–33.0)
MCHC: 34 g/dL (ref 31.5–35.7)
MCV: 94 fL (ref 79–97)
Platelets: 438 10*3/uL (ref 150–450)
RBC: 3.83 x10E6/uL (ref 3.77–5.28)
RDW: 12.6 % (ref 11.7–15.4)
WBC: 14.4 10*3/uL — ABNORMAL HIGH (ref 3.4–10.8)

## 2023-05-03 LAB — HIV ANTIBODY (ROUTINE TESTING W REFLEX): HIV Screen 4th Generation wRfx: NONREACTIVE

## 2023-05-03 LAB — RPR: RPR Ser Ql: NONREACTIVE

## 2023-05-03 MED ORDER — ACCU-CHEK SOFTCLIX LANCETS MISC: 1.0000 | Freq: Four times a day (QID) | 12 refills | Status: AC

## 2023-05-03 MED ORDER — ACCU-CHEK GUIDE W/DEVICE KIT: 1.0000 | PACK | Freq: Four times a day (QID) | 0 refills | Status: DC

## 2023-05-03 MED ORDER — GLUCOSE BLOOD VI STRP
ORAL_STRIP | 12 refills | Status: DC
Start: 2023-05-03 — End: 2023-08-10

## 2023-05-03 NOTE — Progress Notes (Signed)
 Patient has gestational diabetes. Needs referral for education and needs testing supplies. Please inform patient of results and recommendations. Thank you!

## 2023-05-04 ENCOUNTER — Encounter: Payer: Self-pay | Admitting: Obstetrics & Gynecology

## 2023-05-04 DIAGNOSIS — Z8632 Personal history of gestational diabetes: Secondary | ICD-10-CM | POA: Insufficient documentation

## 2023-05-04 DIAGNOSIS — O24419 Gestational diabetes mellitus in pregnancy, unspecified control: Secondary | ICD-10-CM

## 2023-05-04 HISTORY — DX: Gestational diabetes mellitus in pregnancy, unspecified control: O24.419

## 2023-05-10 ENCOUNTER — Ambulatory Visit

## 2023-05-10 NOTE — Progress Notes (Deleted)
 Patient was seen on 05/10/2023 for Gestational Diabetes self-management class at the [facility]. The following learning objectives were met by the patient during this course:  States the definition of Gestational Diabetes States why dietary management is important in controlling blood glucose Describes the effects each nutrient has on blood glucose levels Demonstrates ability to create a balanced meal plan Demonstrates carbohydrate counting  States when to check blood glucose levels Demonstrates proper blood glucose monitoring techniques States the effect of stress and exercise on blood glucose levels States the importance of limiting caffeine and abstaining from alcohol and smoking  Blood glucose monitor given: *** Lot # *** Exp: *** Blood glucose reading: ***  *** Patient has a meter prior to visit. Patient is *** testing pre breakfast and 2 hours after each meal. FBS: *** Postprandial: *** Blood glucose today in class ***  Patient instructed to monitor glucose levels: QID FBS: 60 - <90 1 hour: <140 2 hour: <120  *Patient received handouts:  Nutrition Diabetes and Pregnancy Carbohydrate Counting List Blood glucose log Snack ideas for diabetes during pregnancy  Patient will be seen for follow-up as needed.

## 2023-05-16 ENCOUNTER — Encounter: Payer: Self-pay | Admitting: Obstetrics & Gynecology

## 2023-05-16 ENCOUNTER — Ambulatory Visit (INDEPENDENT_AMBULATORY_CARE_PROVIDER_SITE_OTHER): Admitting: Obstetrics & Gynecology

## 2023-05-16 VITALS — BP 130/82 | HR 93

## 2023-05-16 DIAGNOSIS — O0990 Supervision of high risk pregnancy, unspecified, unspecified trimester: Secondary | ICD-10-CM | POA: Diagnosis not present

## 2023-05-16 DIAGNOSIS — O99210 Obesity complicating pregnancy, unspecified trimester: Secondary | ICD-10-CM

## 2023-05-16 DIAGNOSIS — O4402 Placenta previa specified as without hemorrhage, second trimester: Secondary | ICD-10-CM | POA: Diagnosis not present

## 2023-05-16 DIAGNOSIS — O09299 Supervision of pregnancy with other poor reproductive or obstetric history, unspecified trimester: Secondary | ICD-10-CM

## 2023-05-16 DIAGNOSIS — O24419 Gestational diabetes mellitus in pregnancy, unspecified control: Secondary | ICD-10-CM | POA: Diagnosis not present

## 2023-05-16 DIAGNOSIS — Z3A29 29 weeks gestation of pregnancy: Secondary | ICD-10-CM

## 2023-05-16 DIAGNOSIS — Z8759 Personal history of other complications of pregnancy, childbirth and the puerperium: Secondary | ICD-10-CM

## 2023-05-16 NOTE — Patient Instructions (Signed)

## 2023-05-16 NOTE — Progress Notes (Signed)
   PRENATAL VISIT NOTE  Subjective:  April James is a 33 y.o. [id number] at [redacted]w[redacted]d being seen today for ongoing prenatal care.  She is currently monitored for the following issues for this high-risk pregnancy and has Alcohol use disorder, mild, abuse; Supervision of high risk pregnancy, antepartum; Tobacco use affecting pregnancy, antepartum; Maternal morbid obesity, antepartum (HCC); Bipolar disease in pregnancy (HCC); History of severe preeclampsia in prior pregnancy, currently pregnant; History of prior pregnancy with IUGR; Anxiety during pregnancy; Proteinuria during pregnancy; Placenta previa antepartum, second trimester; Placenta previa with bleeding, second trimester; and Gestational diabetes mellitus, antepartum on their problem list.  Patient reports no complaints.  Contractions: Not present. Vag. Bleeding: None.  Movement: Present. Denies leaking of fluid.   The following portions of the patient's history were reviewed and updated as appropriate: allergies, current medications, past family history, past medical history, past social history, past surgical history and problem list.   Objective:   Vitals:   05/16/23 1038  BP: 130/82  Pulse: 93    Fetal Status:     Movement: Present     General:  Alert, oriented and cooperative. Patient is in no acute distress.  Skin: Skin is warm and dry. No rash noted.   Cardiovascular: Normal heart rate noted  Respiratory: Normal respiratory effort, no problems with respiration noted  Abdomen: Soft, gravid, appropriate for gestational age.  Pain/Pressure: Absent     Pelvic: Cervical exam deferred        Extremities: Normal range of motion.  Edema: None  Mental Status: Normal mood and affect. Normal behavior. Normal judgment and thought content.   Assessment and Plan:  Pregnancy: A5W0981 at [redacted]w[redacted]d 1. Gestational diabetes mellitus (GDM) in third trimester, gestational diabetes method of control unspecified (Primary) Forgot sugar log, cannot  remember numbers. Asked to send picture via MyChart, bring to next appointment with MFM/us.  Advised to bring to every appointment. Will continue to monitor closely.  2. Maternal morbid obesity, antepartum (HCC) TWG - 2 lbs, doing well  3. Placenta previa antepartum, second trimester Scheduled for scan on 05/22/23, aware of need for cesarean delivery at 37 weeks if persists.  4. History of severe preeclampsia in prior pregnancy, currently pregnant On Nifedipine XL 30 mg daily by Dr. Servando Salina for BPs in 130s/80s, continue for now. Will likely need weekly antenatal testing starting at 32 weeks.   5. History of prior pregnancy with IUGR Follow up EFW on next scan.  6. [redacted] weeks gestation of pregnancy 7. Supervision of high risk pregnancy, antepartum No other concerns. Preterm labor symptoms and general obstetric precautions including but not limited to vaginal bleeding, contractions, leaking of fluid and fetal movement were reviewed in detail with the patient. Please refer to After Visit Summary for other counseling recommendations.   Return in about 2 weeks (around 05/30/2023) for OFFICE OB VISIT (MD only).  Future Appointments  Date Time Provider Department Center  05/22/2023  7:00 AM [facility] PROVIDER 1 [facility]  05/22/2023  7:30 AM [facility] US5 [facility]  05/30/2023  9:35 AM Almin Livingstone, Jethro Bastos, MD [facility]  06/13/2023 10:35 AM Abram Bing, MD [facility]  06/27/2023 10:35 AM Sondra Blixt, Jethro Bastos, MD [facility]  07/04/2023 10:35 AM Jolayne Panther, Gigi Gin, MD [facility]  07/11/2023 10:35 AM Reva Bores, MD [facility]  07/18/2023 10:35 AM Lennart Pall, MD [facility]  07/25/2023 10:35 AM Reva Bores, MD [facility]    Jaynie Collins, MD

## 2023-05-22 ENCOUNTER — Ambulatory Visit: Attending: Obstetrics and Gynecology

## 2023-05-22 ENCOUNTER — Other Ambulatory Visit: Payer: Self-pay | Admitting: *Deleted

## 2023-05-22 ENCOUNTER — Ambulatory Visit (HOSPITAL_BASED_OUTPATIENT_CLINIC_OR_DEPARTMENT_OTHER): Admitting: Obstetrics

## 2023-05-22 ENCOUNTER — Other Ambulatory Visit: Payer: Self-pay | Admitting: Obstetrics and Gynecology

## 2023-05-22 VITALS — BP 127/69

## 2023-05-22 DIAGNOSIS — O99340 Other mental disorders complicating pregnancy, unspecified trimester: Secondary | ICD-10-CM | POA: Diagnosis not present

## 2023-05-22 DIAGNOSIS — O09299 Supervision of pregnancy with other poor reproductive or obstetric history, unspecified trimester: Secondary | ICD-10-CM

## 2023-05-22 DIAGNOSIS — O4403 Placenta previa specified as without hemorrhage, third trimester: Secondary | ICD-10-CM

## 2023-05-22 DIAGNOSIS — E669 Obesity, unspecified: Secondary | ICD-10-CM

## 2023-05-22 DIAGNOSIS — Z3A30 30 weeks gestation of pregnancy: Secondary | ICD-10-CM

## 2023-05-22 DIAGNOSIS — F17210 Nicotine dependence, cigarettes, uncomplicated: Secondary | ICD-10-CM

## 2023-05-22 DIAGNOSIS — O10013 Pre-existing essential hypertension complicating pregnancy, third trimester: Secondary | ICD-10-CM | POA: Diagnosis not present

## 2023-05-22 DIAGNOSIS — O99213 Obesity complicating pregnancy, third trimester: Secondary | ICD-10-CM | POA: Diagnosis not present

## 2023-05-22 DIAGNOSIS — Z8759 Personal history of other complications of pregnancy, childbirth and the puerperium: Secondary | ICD-10-CM

## 2023-05-22 DIAGNOSIS — F419 Anxiety disorder, unspecified: Secondary | ICD-10-CM | POA: Insufficient documentation

## 2023-05-22 DIAGNOSIS — O4402 Placenta previa specified as without hemorrhage, second trimester: Secondary | ICD-10-CM | POA: Insufficient documentation

## 2023-05-22 DIAGNOSIS — O99333 Smoking (tobacco) complicating pregnancy, third trimester: Secondary | ICD-10-CM

## 2023-05-22 DIAGNOSIS — O1210 Gestational proteinuria, unspecified trimester: Secondary | ICD-10-CM | POA: Diagnosis not present

## 2023-05-22 DIAGNOSIS — Z362 Encounter for other antenatal screening follow-up: Secondary | ICD-10-CM | POA: Insufficient documentation

## 2023-05-22 DIAGNOSIS — O10913 Unspecified pre-existing hypertension complicating pregnancy, third trimester: Secondary | ICD-10-CM | POA: Insufficient documentation

## 2023-05-22 DIAGNOSIS — Z3A28 28 weeks gestation of pregnancy: Secondary | ICD-10-CM

## 2023-05-22 DIAGNOSIS — O4412 Placenta previa with hemorrhage, second trimester: Secondary | ICD-10-CM

## 2023-05-22 DIAGNOSIS — O09293 Supervision of pregnancy with other poor reproductive or obstetric history, third trimester: Secondary | ICD-10-CM | POA: Diagnosis not present

## 2023-05-22 DIAGNOSIS — O24419 Gestational diabetes mellitus in pregnancy, unspecified control: Secondary | ICD-10-CM

## 2023-05-22 NOTE — Progress Notes (Signed)
 MFM Consult Note  April James is currently at 30 weeks and 2 days.  She has been followed due to maternal obesity with a BMI of 40 and chronic hypertension treated with nifedipine .  Placenta previa was noted on her prior ultrasound exam.    She denies any problems since her last exam.  On today's exam, the overall EFW of 3 pounds 8 ounces measures at the 44th percentile for her gestational age.  There was normal amniotic fluid noted.    A posterior placenta previa continues to be noted on today's exam.    The patient was advised that most cases of placenta previa will resolve later in pregnancy.    She understands that a cesarean delivery may be recommended should the placenta continue to be over her cervix later in her pregnancy.  We will continue to assess the placental location during her future exams.    Due to chronic hypertension treated with nifedipine , we will start weekly fetal testing at 32 weeks.    She will return in 2 weeks for an NST.    The patient stated that all of her questions were answered today.  A total of 20 minutes was spent counseling and coordinating the care for this patient.  Greater than 50% of the time was spent in direct face-to-face contact.

## 2023-05-30 ENCOUNTER — Encounter: Payer: Self-pay | Admitting: Obstetrics & Gynecology

## 2023-05-30 ENCOUNTER — Ambulatory Visit: Payer: Medicaid Other | Admitting: Obstetrics & Gynecology

## 2023-05-30 VITALS — BP 131/82 | HR 87 | Wt 230.0 lb

## 2023-05-30 DIAGNOSIS — O4403 Placenta previa specified as without hemorrhage, third trimester: Secondary | ICD-10-CM

## 2023-05-30 DIAGNOSIS — O163 Unspecified maternal hypertension, third trimester: Secondary | ICD-10-CM | POA: Diagnosis not present

## 2023-05-30 DIAGNOSIS — O24419 Gestational diabetes mellitus in pregnancy, unspecified control: Secondary | ICD-10-CM | POA: Diagnosis not present

## 2023-05-30 DIAGNOSIS — Z3A31 31 weeks gestation of pregnancy: Secondary | ICD-10-CM

## 2023-05-30 DIAGNOSIS — O09299 Supervision of pregnancy with other poor reproductive or obstetric history, unspecified trimester: Secondary | ICD-10-CM | POA: Diagnosis not present

## 2023-05-30 DIAGNOSIS — O169 Unspecified maternal hypertension, unspecified trimester: Secondary | ICD-10-CM | POA: Insufficient documentation

## 2023-05-30 DIAGNOSIS — O0990 Supervision of high risk pregnancy, unspecified, unspecified trimester: Secondary | ICD-10-CM

## 2023-05-30 DIAGNOSIS — Z8759 Personal history of other complications of pregnancy, childbirth and the puerperium: Secondary | ICD-10-CM | POA: Insufficient documentation

## 2023-05-30 HISTORY — DX: Unspecified maternal hypertension, unspecified trimester: O16.9

## 2023-05-30 MED ORDER — NIFEDIPINE ER OSMOTIC RELEASE 30 MG PO TB24: 30.0000 mg | ORAL_TABLET | Freq: Two times a day (BID) | ORAL | 3 refills | Status: DC

## 2023-05-30 NOTE — Patient Instructions (Signed)

## 2023-05-30 NOTE — Progress Notes (Signed)
 PRENATAL VISIT NOTE  Subjective:  April James is a 33 y.o. F6O1308 at [redacted]w[redacted]d being seen today for ongoing prenatal care.  She is currently monitored for the following issues for this high-risk pregnancy and has Alcohol use disorder, mild, abuse; Supervision of high risk pregnancy, antepartum; Tobacco use affecting pregnancy, antepartum; Maternal morbid obesity, antepartum (HCC); Bipolar disease in pregnancy (HCC); History of severe preeclampsia in prior pregnancy, currently pregnant; History of prior pregnancy with IUGR; Anxiety during pregnancy; Proteinuria during pregnancy; Placenta previa antepartum, third trimester; Placenta previa with bleeding, second trimester; Gestational diabetes mellitus, antepartum; and Hypertension in pregnancy on their problem list.  Patient reports elevated BPs at home in 140s/90s, feels she needs her BP medication dosage changed. Denies any headaches, visual symptoms, RUQ/epigastric pain or other concerning symptoms.  Contractions: Not present. Vag. Bleeding: None.  Movement: Present. Denies leaking of fluid.   The following portions of the patient's history were reviewed and updated as appropriate: allergies, current medications, past family history, past medical history, past social history, past surgical history and problem list.   Objective:   Vitals:   05/30/23 0940  BP: 131/82  Pulse: 87  Weight: 230 lb (104.3 kg)    Fetal Status: Fetal Heart Rate (bpm): 131   Movement: Present     General:  Alert, oriented and cooperative. Patient is in no acute distress.  Skin: Skin is warm and dry. No rash noted.   Cardiovascular: Normal heart rate noted  Respiratory: Normal respiratory effort, no problems with respiration noted  Abdomen: Soft, gravid, appropriate for gestational age.  Pain/Pressure: Present     Pelvic: Cervical exam deferred        Extremities: Normal range of motion.  Edema: None  Mental Status: Normal mood and affect. Normal behavior. Normal  judgment and thought content.   Assessment and Plan:  Pregnancy: [id number] at [redacted]w[redacted]d 1. Hypertension during pregnancy in third trimester (Primary) 2. History of severe preeclampsia in prior pregnancy, currently pregnant Increased dosage to 30 mg bid (can also do 60 mg daily). Will monitor effect.  BP stable in office today, no symptoms. Has upcoming appointment with Dr. Emmette Harms, will follow up results and manage accordingly. Already scheduled for upcoming weekly antenatal testing. - NIFEdipine  (PROCARDIA -XL/NIFEDICAL-XL) 30 MG 24 hr tablet; Take 1 tablet (30 mg total) by mouth 2 (two) times daily.  Dispense: 60 tablet; Refill: 3  3. Gestational diabetes mellitus (GDM) in third trimester, gestational diabetes method of control unspecified Reviewed blood sugars, most within range, continue diet and exercise.  4. Placenta previa antepartum, third trimester No bleeding episodes.  Will follow up next scan, if still present, schedule cesarean section at 37 weeks  5. [redacted] weeks gestation of pregnancy 6. Supervision of high risk pregnancy, antepartum No other concerns. Preterm labor symptoms and general obstetric precautions including but not limited to vaginal bleeding, contractions, leaking of fluid and fetal movement were reviewed in detail with the patient. Please refer to After Visit Summary for other counseling recommendations.   Return in about 2 weeks (around 06/13/2023) for OB Visit with MD (virtual if desired).  Future Appointments  Date Time Provider Department Center  06/05/2023  8:30 AM [facility] PROVIDER 1 [facility]  06/05/2023  8:45 AM [facility] NST [facility]  06/12/2023  8:30 AM [facility] PROVIDER 1 [facility]  06/12/2023  8:45 AM [facility] NST [facility]  06/13/2023 10:35 AM Raynell Caller, MD [facility]  06/19/2023  7:00 AM [facility] PROVIDER 1 [facility]  06/19/2023  7:30  AM [facility] US1 [facility]  06/27/2023 10:35 AM Zyren Sevigny, Kathrine Paris, MD [facility]  06/28/2023   7:00 AM [facility] PROVIDER 1 [facility]  06/28/2023  7:30 AM [facility] US3 [facility]  07/03/2023  8:30 AM [facility] PROVIDER 1 [facility]  07/03/2023  8:45 AM [facility] NST [facility]  07/04/2023 10:35 AM Constant, Peggy, MD [facility]  07/10/2023  8:30 AM [facility] PROVIDER 1 [facility]  07/10/2023  8:45 AM [facility] NST [facility]  07/11/2023 10:35 AM Granville Layer, MD [facility]  07/18/2023 10:35 AM Izell Marsh, MD [facility]  07/25/2023 10:35 AM Granville Layer, MD [facility]    Lenoard Rad, MD

## 2023-06-05 ENCOUNTER — Ambulatory Visit: Attending: Obstetrics

## 2023-06-05 ENCOUNTER — Other Ambulatory Visit

## 2023-06-12 ENCOUNTER — Ambulatory Visit: Attending: Obstetrics

## 2023-06-12 ENCOUNTER — Ambulatory Visit (HOSPITAL_BASED_OUTPATIENT_CLINIC_OR_DEPARTMENT_OTHER): Admitting: *Deleted

## 2023-06-12 VITALS — BP 121/59

## 2023-06-12 DIAGNOSIS — O10013 Pre-existing essential hypertension complicating pregnancy, third trimester: Secondary | ICD-10-CM

## 2023-06-12 DIAGNOSIS — Z3A33 33 weeks gestation of pregnancy: Secondary | ICD-10-CM

## 2023-06-12 DIAGNOSIS — Z3A32 32 weeks gestation of pregnancy: Secondary | ICD-10-CM | POA: Insufficient documentation

## 2023-06-12 DIAGNOSIS — E669 Obesity, unspecified: Secondary | ICD-10-CM

## 2023-06-12 DIAGNOSIS — O24419 Gestational diabetes mellitus in pregnancy, unspecified control: Secondary | ICD-10-CM

## 2023-06-12 DIAGNOSIS — O99213 Obesity complicating pregnancy, third trimester: Secondary | ICD-10-CM

## 2023-06-12 DIAGNOSIS — O4403 Placenta previa specified as without hemorrhage, third trimester: Secondary | ICD-10-CM | POA: Diagnosis not present

## 2023-06-12 DIAGNOSIS — O10913 Unspecified pre-existing hypertension complicating pregnancy, third trimester: Secondary | ICD-10-CM

## 2023-06-12 NOTE — Procedures (Signed)
 April James [birth year] [redacted]w[redacted]d  Fetus A Non-Stress Test Interpretation for 06/12/23 (NST only)  Indication: Chronic Hypertenstion and GDM, obesity  Fetal Heart Rate A Mode: External Baseline Rate (A): 135 bpm Variability: Moderate Accelerations: 15 x 15 Decelerations: None Multiple birth?: No  Uterine Activity Mode: Palpation, Toco Contraction Frequency (min): UI Contraction Quality: Mild Resting Tone Palpated: Relaxed Resting Time: Adequate  Interpretation (Fetal Testing) Nonstress Test Interpretation: Reactive Comments: Dr. Nolan Battle reveiwed tracing.

## 2023-06-13 ENCOUNTER — Telehealth: Admitting: Obstetrics and Gynecology

## 2023-06-13 VITALS — BP 124/72 | HR 91

## 2023-06-13 DIAGNOSIS — O1210 Gestational proteinuria, unspecified trimester: Secondary | ICD-10-CM

## 2023-06-13 DIAGNOSIS — O24410 Gestational diabetes mellitus in pregnancy, diet controlled: Secondary | ICD-10-CM | POA: Diagnosis not present

## 2023-06-13 DIAGNOSIS — O99210 Obesity complicating pregnancy, unspecified trimester: Secondary | ICD-10-CM

## 2023-06-13 DIAGNOSIS — O09299 Supervision of pregnancy with other poor reproductive or obstetric history, unspecified trimester: Secondary | ICD-10-CM

## 2023-06-13 DIAGNOSIS — Z6841 Body Mass Index (BMI) 40.0 and over, adult: Secondary | ICD-10-CM | POA: Diagnosis not present

## 2023-06-13 DIAGNOSIS — F319 Bipolar disorder, unspecified: Secondary | ICD-10-CM

## 2023-06-13 DIAGNOSIS — Z3A33 33 weeks gestation of pregnancy: Secondary | ICD-10-CM

## 2023-06-13 DIAGNOSIS — Z8759 Personal history of other complications of pregnancy, childbirth and the puerperium: Secondary | ICD-10-CM | POA: Diagnosis not present

## 2023-06-13 DIAGNOSIS — O99343 Other mental disorders complicating pregnancy, third trimester: Secondary | ICD-10-CM | POA: Diagnosis not present

## 2023-06-13 DIAGNOSIS — F419 Anxiety disorder, unspecified: Secondary | ICD-10-CM

## 2023-06-13 DIAGNOSIS — O0990 Supervision of high risk pregnancy, unspecified, unspecified trimester: Secondary | ICD-10-CM

## 2023-06-13 DIAGNOSIS — O163 Unspecified maternal hypertension, third trimester: Secondary | ICD-10-CM

## 2023-06-13 DIAGNOSIS — O4403 Placenta previa specified as without hemorrhage, third trimester: Secondary | ICD-10-CM

## 2023-06-13 NOTE — Progress Notes (Unsigned)
 Virtual ROB   Pt able to check B/P at home.   CC: None

## 2023-06-14 ENCOUNTER — Encounter: Payer: Self-pay | Admitting: Obstetrics and Gynecology

## 2023-06-14 DIAGNOSIS — Z6841 Body Mass Index (BMI) 40.0 and over, adult: Secondary | ICD-10-CM | POA: Insufficient documentation

## 2023-06-14 NOTE — Progress Notes (Addendum)
 TELEHEALTH OBSTETRICS VISIT ENCOUNTER NOTE  Provider location: Center for [facility] at [facility]   Patient location: Home  I connected with April James on 06/14/23 at 10:35 AM EDT by telephone at home and verified that I am speaking with the correct person using two identifiers. Of note, unable to do video encounter due to technical difficulties.    I discussed the limitations, risks, security and privacy concerns of performing an evaluation and management service by telephone and the availability of in person appointments. I also discussed with the patient that there may be a patient responsible charge related to this service. The patient expressed understanding and agreed to proceed.  Subjective:  April Croke is a 33 y.o. [id number] at [redacted]w[redacted]d being followed for ongoing prenatal care.  She is currently monitored for the following issues for this high-risk pregnancy and has Supervision of high risk pregnancy, antepartum; Tobacco use affecting pregnancy, antepartum; Maternal morbid obesity, antepartum (HCC); Bipolar disease in pregnancy (HCC); History of severe preeclampsia in prior pregnancy, currently pregnant; History of prior pregnancy with IUGR; Anxiety during pregnancy; Proteinuria during pregnancy; Placenta previa antepartum, third trimester; Gestational diabetes mellitus, antepartum; Hypertension in pregnancy; and BMI 40.0-44.9, adult (HCC) on their problem list.  Patient reports no complaints. Reports fetal movement. Denies any contractions, bleeding or leaking of fluid.   The following portions of the patient's history were reviewed and updated as appropriate: allergies, current medications, past family history, past medical history, past social history, past surgical history and problem list.   Objective:  Blood pressure 124/72, pulse 91, last menstrual period 10/10/2022, not currently breastfeeding. General:  Alert, oriented and cooperative.   Mental Status: Normal  mood and affect perceived. Normal judgment and thought content.  Rest of physical exam deferred due to type of encounter  Assessment and Plan:  Pregnancy: [id number] at [redacted]w[redacted]d 1. [redacted] weeks gestation of pregnancy (Primary) Leaning towards paragard  2. Hypertension during pregnancy in third trimester, unspecified hypertension in pregnancy type Started on meds at 24-25wks by [facility] cardiology; pt denies any h/o HTN outside of pregnancy. Patient doing well on procardia  30 bid and low dose ASA To start weekly testing  3. Diet controlled gestational diabetes mellitus (GDM), antepartum Normal CBG log on no meds  4. Supervision of high risk pregnancy, antepartum  5. Proteinuria during pregnancy Baseline PC ratio at new OB 435.   6. Placenta previa antepartum, third trimester Still present on 4/21. I d/w her that recommend scheduling c/s at 37wks at next visit if previa still present. If it resolves, I told her can cancel surgery.  4/21: posterior previa. 44%, 1581g, ac 87%, afi 20  7. Maternal morbid obesity, antepartum (HCC)  8. BMI 40.0-44.9, adult (HCC)  9. History of severe preeclampsia in prior pregnancy, currently pregnant  10. History of prior pregnancy with IUGR  11. Bipolar disease during pregnancy in third trimester (HCC) No issues on no meds  12. Anxiety during pregnancy No issues on no meds  Preterm labor symptoms and general obstetric precautions including but not limited to vaginal bleeding, contractions, leaking of fluid and fetal movement were reviewed in detail with the patient.  I discussed the assessment and treatment plan with the patient. The patient was provided an opportunity to ask questions and all were answered. The patient agreed with the plan and demonstrated an understanding of the instructions. The patient was advised to call back or seek an in-person office evaluation/go to [facility] at [facility] for any urgent  or concerning symptoms. Please refer to  After Visit Summary for other counseling recommendations.   I provided 10 minutes of non-face-to-face time during this encounter.  Return in 6 days (on 06/19/2023) for md visit, virtual visit, high risk ob.  Future Appointments  Date Time Provider Department Center  06/19/2023  7:00 AM [facility] PROVIDER 1 [facility]  06/19/2023  7:30 AM [facility] US1 [facility]  06/19/2023  1:30 PM Raynell Caller, MD [facility]  06/27/2023 10:35 AM Thurmon Florida, Kathrine Paris, MD [facility]  06/28/2023  7:00 AM [facility] PROVIDER 1 [facility]  06/28/2023  7:30 AM [facility] US3 [facility]  07/03/2023  8:45 AM [facility] NST [facility]  07/04/2023 10:35 AM Constant, Carles Cheadle, MD [facility]  07/10/2023  8:45 AM [facility] NST [facility]  07/11/2023 10:35 AM Granville Layer, MD [facility]  07/18/2023 10:35 AM Izell Marsh, MD [facility]  07/25/2023 10:35 AM Granville Layer, MD [facility]    Raynell Caller, MD Center for [facility]

## 2023-06-19 ENCOUNTER — Ambulatory Visit: Admitting: Obstetrics and Gynecology

## 2023-06-19 ENCOUNTER — Encounter: Admitting: Obstetrics and Gynecology

## 2023-06-19 ENCOUNTER — Ambulatory Visit: Attending: Obstetrics

## 2023-06-19 VITALS — BP 135/69

## 2023-06-19 DIAGNOSIS — Z3A34 34 weeks gestation of pregnancy: Secondary | ICD-10-CM

## 2023-06-19 DIAGNOSIS — O09299 Supervision of pregnancy with other poor reproductive or obstetric history, unspecified trimester: Secondary | ICD-10-CM

## 2023-06-19 DIAGNOSIS — Z8759 Personal history of other complications of pregnancy, childbirth and the puerperium: Secondary | ICD-10-CM | POA: Insufficient documentation

## 2023-06-19 DIAGNOSIS — O99213 Obesity complicating pregnancy, third trimester: Secondary | ICD-10-CM | POA: Diagnosis not present

## 2023-06-19 DIAGNOSIS — O09293 Supervision of pregnancy with other poor reproductive or obstetric history, third trimester: Secondary | ICD-10-CM | POA: Diagnosis not present

## 2023-06-19 DIAGNOSIS — O99343 Other mental disorders complicating pregnancy, third trimester: Secondary | ICD-10-CM

## 2023-06-19 DIAGNOSIS — F419 Anxiety disorder, unspecified: Secondary | ICD-10-CM | POA: Insufficient documentation

## 2023-06-19 DIAGNOSIS — O99340 Other mental disorders complicating pregnancy, unspecified trimester: Secondary | ICD-10-CM | POA: Diagnosis not present

## 2023-06-19 DIAGNOSIS — O133 Gestational [pregnancy-induced] hypertension without significant proteinuria, third trimester: Secondary | ICD-10-CM

## 2023-06-19 DIAGNOSIS — O4403 Placenta previa specified as without hemorrhage, third trimester: Secondary | ICD-10-CM

## 2023-06-19 DIAGNOSIS — O24410 Gestational diabetes mellitus in pregnancy, diet controlled: Secondary | ICD-10-CM

## 2023-06-19 DIAGNOSIS — E669 Obesity, unspecified: Secondary | ICD-10-CM

## 2023-06-19 DIAGNOSIS — O1210 Gestational proteinuria, unspecified trimester: Secondary | ICD-10-CM | POA: Diagnosis not present

## 2023-06-19 NOTE — Progress Notes (Signed)
  Maternal-Fetal Medicine Consultation Name: April James MRN: [id number]  G5 B1478 at 34w 2d gestation. -Placenta previa.  Patient does not give history of vaginal bleeding. - Gestational diabetes.  Reportedly well-controlled on diet. - New diagnosis of gestational hypertension.  Patient does not have signs and symptoms of severe features of preeclampsia.  Blood pressure today at our office is 135/69 mmHg.  Ultrasound Fetal growth is appropriate for gestational age.  Amniotic fluid is normal good fetal activity seen.  Cephalic presentation.  Antenatal testing is reassuring.  BPP 8/8. On transabdominal scan, placenta previa is clearly seen with placenta covering the internal os.  Placenta previa I discussed the persistence of placenta previa with the help of ultrasound images.  Because of increased risk of vaginal bleeding beyond [redacted] weeks gestation, we recommend delivery at [redacted] weeks gestation.   Delivery at [redacted] weeks gestation quick considered if patient has vaginal bleeding.  Gestational hypertension This is the most probable diagnosis considering she did not have chronic hypertension.  Discussed the possible complications of gestational hypertension including preeclampsia/eclampsia/endorgan damage/placental abruption  Recommendations -Continue weekly BPP/NST till delivery. - Delivery at [redacted] weeks gestation.  Consultation including face-to-face (more than 50%) counseling 20 minutes.

## 2023-06-27 ENCOUNTER — Ambulatory Visit: Admitting: Obstetrics & Gynecology

## 2023-06-27 ENCOUNTER — Inpatient Hospital Stay (HOSPITAL_COMMUNITY)
Admission: RE | Admit: 2023-06-27 | Discharge: 2023-06-29 | DRG: 787 | Disposition: A | Discharge status: Home / Self Care | Source: Ambulatory Visit | Attending: Obstetrics and Gynecology | Admitting: Obstetrics and Gynecology

## 2023-06-27 ENCOUNTER — Encounter (HOSPITAL_COMMUNITY): Payer: Self-pay | Admitting: Obstetrics & Gynecology

## 2023-06-27 ENCOUNTER — Inpatient Hospital Stay (HOSPITAL_COMMUNITY): Admitting: Anesthesiology

## 2023-06-27 ENCOUNTER — Other Ambulatory Visit (HOSPITAL_COMMUNITY)
Admission: RE | Admit: 2023-06-27 | Discharge: 2023-06-27 | Disposition: A | Discharge status: Home / Self Care | Source: Ambulatory Visit | Attending: Obstetrics & Gynecology | Admitting: Obstetrics & Gynecology

## 2023-06-27 ENCOUNTER — Other Ambulatory Visit: Payer: Self-pay

## 2023-06-27 ENCOUNTER — Encounter (HOSPITAL_COMMUNITY)
Admission: RE | Disposition: A | Discharge status: Home / Self Care | Payer: Self-pay | Source: Ambulatory Visit | Attending: Obstetrics and Gynecology

## 2023-06-27 ENCOUNTER — Other Ambulatory Visit (HOSPITAL_COMMUNITY): Admit: 2023-06-27

## 2023-06-27 VITALS — BP 130/86 | HR 98 | Wt 233.0 lb

## 2023-06-27 DIAGNOSIS — O4410 Placenta previa with hemorrhage, unspecified trimester: Secondary | ICD-10-CM | POA: Diagnosis not present

## 2023-06-27 DIAGNOSIS — O4403 Placenta previa specified as without hemorrhage, third trimester: Secondary | ICD-10-CM | POA: Diagnosis not present

## 2023-06-27 DIAGNOSIS — D62 Acute posthemorrhagic anemia: Secondary | ICD-10-CM | POA: Diagnosis not present

## 2023-06-27 DIAGNOSIS — O0990 Supervision of high risk pregnancy, unspecified, unspecified trimester: Secondary | ICD-10-CM | POA: Diagnosis not present

## 2023-06-27 DIAGNOSIS — Z3A35 35 weeks gestation of pregnancy: Secondary | ICD-10-CM

## 2023-06-27 DIAGNOSIS — Z8249 Family history of ischemic heart disease and other diseases of the circulatory system: Secondary | ICD-10-CM

## 2023-06-27 DIAGNOSIS — O4413 Placenta previa with hemorrhage, third trimester: Secondary | ICD-10-CM | POA: Diagnosis not present

## 2023-06-27 DIAGNOSIS — O4433 Partial placenta previa with hemorrhage, third trimester: Secondary | ICD-10-CM | POA: Diagnosis not present

## 2023-06-27 DIAGNOSIS — O9081 Anemia of the puerperium: Secondary | ICD-10-CM | POA: Diagnosis not present

## 2023-06-27 DIAGNOSIS — Z833 Family history of diabetes mellitus: Secondary | ICD-10-CM | POA: Diagnosis not present

## 2023-06-27 DIAGNOSIS — Z30430 Encounter for insertion of intrauterine contraceptive device: Secondary | ICD-10-CM | POA: Diagnosis not present

## 2023-06-27 DIAGNOSIS — Z87891 Personal history of nicotine dependence: Secondary | ICD-10-CM | POA: Diagnosis not present

## 2023-06-27 DIAGNOSIS — O1092 Unspecified pre-existing hypertension complicating childbirth: Secondary | ICD-10-CM | POA: Diagnosis not present

## 2023-06-27 DIAGNOSIS — O134 Gestational [pregnancy-induced] hypertension without significant proteinuria, complicating childbirth: Secondary | ICD-10-CM | POA: Diagnosis not present

## 2023-06-27 DIAGNOSIS — Z880 Allergy status to penicillin: Secondary | ICD-10-CM | POA: Diagnosis not present

## 2023-06-27 DIAGNOSIS — O24420 Gestational diabetes mellitus in childbirth, diet controlled: Secondary | ICD-10-CM | POA: Diagnosis not present

## 2023-06-27 DIAGNOSIS — O163 Unspecified maternal hypertension, third trimester: Principal | ICD-10-CM

## 2023-06-27 HISTORY — PX: INTRAUTERINE DEVICE (IUD) INSERTION: SHX5877

## 2023-06-27 LAB — COMPREHENSIVE METABOLIC PANEL WITH GFR
ALT: 14 U/L (ref 0–44)
AST: 16 U/L (ref 15–41)
Albumin: 2.7 g/dL — ABNORMAL LOW (ref 3.5–5.0)
Alkaline Phosphatase: 148 U/L — ABNORMAL HIGH (ref 38–126)
Anion gap: 9 (ref 5–15)
BUN: 11 mg/dL (ref 6–20)
CO2: 18 mmol/L — ABNORMAL LOW (ref 22–32)
Calcium: 8.7 mg/dL — ABNORMAL LOW (ref 8.9–10.3)
Chloride: 107 mmol/L (ref 98–111)
Creatinine, Ser: 0.62 mg/dL (ref 0.44–1.00)
GFR, Estimated: >60 mL/min (ref >60)
Glucose, Bld: 92 mg/dL (ref 70–99)
Potassium: 3.7 mmol/L (ref 3.5–5.1)
Sodium: 134 mmol/L — ABNORMAL LOW (ref 135–145)
Total Bilirubin: 0.3 mg/dL (ref 0.0–1.2)
Total Protein: 6.7 g/dL (ref 6.5–8.1)

## 2023-06-27 LAB — CBC
HCT: 30.4 % — ABNORMAL LOW (ref 36.0–46.0)
Hemoglobin: 10.3 g/dL — ABNORMAL LOW (ref 12.0–15.0)
MCH: 31.7 pg (ref 26.0–34.0)
MCHC: 33.9 g/dL (ref 30.0–36.0)
MCV: 93.5 fL (ref 80.0–100.0)
Platelets: 450 10*3/uL — ABNORMAL HIGH (ref 150–400)
RBC: 3.25 MIL/uL — ABNORMAL LOW (ref 3.87–5.11)
RDW: 13.2 % (ref 11.5–15.5)
WBC: 17.1 10*3/uL — ABNORMAL HIGH (ref 4.0–10.5)
nRBC: 0 % (ref 0.0–0.2)

## 2023-06-27 LAB — GLUCOSE, CAPILLARY
Glucose-Capillary: 94 mg/dL (ref 70–99)
Glucose-Capillary: 87 mg/dL (ref 70–99)

## 2023-06-27 LAB — PREPARE RBC (CROSSMATCH)

## 2023-06-27 SURGERY — Surgical Case
Anesthesia: Regional

## 2023-06-27 MED ORDER — LACTATED RINGERS IV SOLN: 125.0000 mL/h | INTRAVENOUS | Status: AC

## 2023-06-27 MED ORDER — ONDANSETRON HCL 4 MG/2ML IJ SOLN
INTRAMUSCULAR | Status: AC
  Filled 2023-06-27: qty 2

## 2023-06-27 MED ORDER — KETOROLAC TROMETHAMINE 30 MG/ML IJ SOLN
30.0000 mg | Freq: Once | INTRAMUSCULAR | Status: AC | PRN
  Administered 2023-06-27: 30 mg via INTRAVENOUS

## 2023-06-27 MED ORDER — NIFEDIPINE ER OSMOTIC RELEASE 30 MG PO TB24
30.0000 mg | ORAL_TABLET | Freq: Every day | ORAL | Status: DC
  Administered 2023-06-28 – 2023-06-29 (×2): 30 mg via ORAL
  Filled 2023-06-27 (×2): qty 1

## 2023-06-27 MED ORDER — KETOROLAC TROMETHAMINE 30 MG/ML IJ SOLN
INTRAMUSCULAR | Status: AC
  Filled 2023-06-27: qty 1

## 2023-06-27 MED ORDER — BUPIVACAINE IN DEXTROSE 0.75-8.25 % IT SOLN
INTRATHECAL | Status: DC | PRN
  Administered 2023-06-27: 12 mg via INTRATHECAL

## 2023-06-27 MED ORDER — LACTATED RINGERS IV SOLN: INTRAVENOUS | Status: DC

## 2023-06-27 MED ORDER — SODIUM CHLORIDE 0.9 % IR SOLN
Status: DC | PRN
  Administered 2023-06-27: 1

## 2023-06-27 MED ORDER — SODIUM CHLORIDE 0.9% FLUSH: 3.0000 mL | Freq: Two times a day (BID) | INTRAVENOUS | Status: DC

## 2023-06-27 MED ORDER — FENTANYL CITRATE (PF) 100 MCG/2ML IJ SOLN
INTRAMUSCULAR | Status: DC | PRN
  Administered 2023-06-27: 15 ug via INTRATHECAL

## 2023-06-27 MED ORDER — NIFEDIPINE ER OSMOTIC RELEASE 30 MG PO TB24: 30.0000 mg | ORAL_TABLET | Freq: Two times a day (BID) | ORAL | Status: DC

## 2023-06-27 MED ORDER — GENTAMICIN SULFATE 40 MG/ML IJ SOLN
INTRAVENOUS | Status: DC | PRN
  Administered 2023-06-27: 360 mg via INTRAVENOUS

## 2023-06-27 MED ORDER — ONDANSETRON HCL 4 MG/2ML IJ SOLN: 4.0000 mg | Freq: Once | INTRAMUSCULAR | Status: DC | PRN

## 2023-06-27 MED ORDER — SODIUM CHLORIDE 0.9% FLUSH
3.0000 mL | INTRAVENOUS | Status: DC | PRN
  Administered 2023-06-27: 3 mL via INTRAVENOUS

## 2023-06-27 MED ORDER — PARAGARD INTRAUTERINE COPPER IU IUD
1.0000 | INTRAUTERINE_SYSTEM | Freq: Once | INTRAUTERINE | Status: AC
  Administered 2023-06-27: 1 via INTRAUTERINE

## 2023-06-27 MED ORDER — OXYTOCIN-SODIUM CHLORIDE 30-0.9 UT/500ML-% IV SOLN
2.5000 [IU]/h | INTRAVENOUS | Status: AC
  Administered 2023-06-27: 2.5 [IU]/h via INTRAVENOUS
  Filled 2023-06-27: qty 500

## 2023-06-27 MED ORDER — SIMETHICONE 80 MG PO CHEW
80.0000 mg | CHEWABLE_TABLET | Freq: Three times a day (TID) | ORAL | Status: DC
  Administered 2023-06-28 – 2023-06-29 (×3): 80 mg via ORAL
  Filled 2023-06-27 (×4): qty 1

## 2023-06-27 MED ORDER — ACETAMINOPHEN 325 MG PO TABS
650.0000 mg | ORAL_TABLET | ORAL | Status: DC | PRN
  Administered 2023-06-27: 650 mg via ORAL
  Filled 2023-06-27: qty 2

## 2023-06-27 MED ORDER — PHENYLEPHRINE HCL-NACL 20-0.9 MG/250ML-% IV SOLN
INTRAVENOUS | Status: DC | PRN
  Administered 2023-06-27: 60 ug/min via INTRAVENOUS

## 2023-06-27 MED ORDER — CALCIUM CARBONATE ANTACID 500 MG PO CHEW: 2.0000 | CHEWABLE_TABLET | ORAL | Status: DC | PRN

## 2023-06-27 MED ORDER — ONDANSETRON 4 MG PO TBDP
4.0000 mg | ORAL_TABLET | Freq: Four times a day (QID) | ORAL | Status: DC | PRN
  Administered 2023-06-27: 4 mg via ORAL
  Filled 2023-06-27: qty 1

## 2023-06-27 MED ORDER — OXYTOCIN-SODIUM CHLORIDE 30-0.9 UT/500ML-% IV SOLN
INTRAVENOUS | Status: AC
  Filled 2023-06-27: qty 500

## 2023-06-27 MED ORDER — POVIDONE-IODINE 10 % EX SWAB: 2.0000 | Freq: Once | CUTANEOUS | Status: DC

## 2023-06-27 MED ORDER — IBUPROFEN 600 MG PO TABS
600.0000 mg | ORAL_TABLET | Freq: Four times a day (QID) | ORAL | Status: DC
  Administered 2023-06-28 – 2023-06-29 (×6): 600 mg via ORAL
  Filled 2023-06-27 (×6): qty 1

## 2023-06-27 MED ORDER — ZOLPIDEM TARTRATE 5 MG PO TABS: 5.0000 mg | ORAL_TABLET | Freq: Every evening | ORAL | Status: DC | PRN

## 2023-06-27 MED ORDER — CLINDAMYCIN PHOSPHATE 900 MG/50ML IV SOLN
900.0000 mg | INTRAVENOUS | Status: AC
  Administered 2023-06-27: 900 mg via INTRAVENOUS

## 2023-06-27 MED ORDER — OXYCODONE HCL 5 MG PO TABS
5.0000 mg | ORAL_TABLET | ORAL | Status: DC | PRN
  Administered 2023-06-28: 5 mg via ORAL
  Administered 2023-06-28 – 2023-06-29 (×3): 10 mg via ORAL
  Filled 2023-06-27 (×2): qty 2
  Filled 2023-06-27: qty 1
  Filled 2023-06-27: qty 2

## 2023-06-27 MED ORDER — HYDROXYZINE HCL 50 MG PO TABS
25.0000 mg | ORAL_TABLET | Freq: Four times a day (QID) | ORAL | Status: DC | PRN
  Administered 2023-06-27: 50 mg via ORAL
  Filled 2023-06-27: qty 1

## 2023-06-27 MED ORDER — POLYETHYLENE GLYCOL 3350 17 G PO PACK: 17.0000 g | PACK | ORAL | Status: DC

## 2023-06-27 MED ORDER — SOD CITRATE-CITRIC ACID 500-334 MG/5ML PO SOLN
ORAL | Status: AC
  Administered 2023-06-27: 30 mL
  Filled 2023-06-27: qty 30

## 2023-06-27 MED ORDER — OXYCODONE HCL 5 MG/5ML PO SOLN: 5.0000 mg | Freq: Once | ORAL | Status: DC | PRN

## 2023-06-27 MED ORDER — PARAGARD INTRAUTERINE COPPER IU IUD
INTRAUTERINE_SYSTEM | INTRAUTERINE | Status: AC
Start: 2023-06-27 — End: ?
  Filled 2023-06-27: qty 1

## 2023-06-27 MED ORDER — COCONUT OIL OIL: 1.0000 | TOPICAL_OIL | Status: DC | PRN

## 2023-06-27 MED ORDER — ONDANSETRON HCL 4 MG/2ML IJ SOLN
INTRAMUSCULAR | Status: DC | PRN
  Administered 2023-06-27: 4 mg via INTRAVENOUS

## 2023-06-27 MED ORDER — DIPHENHYDRAMINE HCL 25 MG PO CAPS
25.0000 mg | ORAL_CAPSULE | Freq: Four times a day (QID) | ORAL | Status: DC | PRN
Start: 2023-06-27 — End: 2023-06-29
  Administered 2023-06-28: 25 mg via ORAL
  Filled 2023-06-27: qty 1

## 2023-06-27 MED ORDER — HYDROMORPHONE HCL 1 MG/ML IJ SOLN
INTRAMUSCULAR | Status: AC
  Filled 2023-06-27: qty 0.5

## 2023-06-27 MED ORDER — MORPHINE SULFATE (PF) 0.5 MG/ML IJ SOLN
INTRAMUSCULAR | Status: DC | PRN
  Administered 2023-06-27: 150 ug via EPIDURAL

## 2023-06-27 MED ORDER — GENTAMICIN SULFATE 40 MG/ML IJ SOLN
5.0000 mg/kg | INTRAVENOUS | Status: DC
  Filled 2023-06-27: qty 9

## 2023-06-27 MED ORDER — DIBUCAINE (PERIANAL) 1 % EX OINT: 1.0000 | TOPICAL_OINTMENT | CUTANEOUS | Status: DC | PRN

## 2023-06-27 MED ORDER — PHENYLEPHRINE HCL-NACL 20-0.9 MG/250ML-% IV SOLN
INTRAVENOUS | Status: AC
  Filled 2023-06-27: qty 250

## 2023-06-27 MED ORDER — FENTANYL CITRATE (PF) 100 MCG/2ML IJ SOLN
INTRAMUSCULAR | Status: AC
  Filled 2023-06-27: qty 2

## 2023-06-27 MED ORDER — OXYTOCIN-SODIUM CHLORIDE 30-0.9 UT/500ML-% IV SOLN
INTRAVENOUS | Status: DC | PRN
  Administered 2023-06-27: 30 [IU] via INTRAVENOUS

## 2023-06-27 MED ORDER — HYDROMORPHONE HCL 1 MG/ML IJ SOLN
0.2500 mg | INTRAMUSCULAR | Status: DC | PRN
  Administered 2023-06-27: 0.5 mg via INTRAVENOUS

## 2023-06-27 MED ORDER — WITCH HAZEL-GLYCERIN EX PADS
1.0000 | MEDICATED_PAD | CUTANEOUS | Status: DC | PRN
Start: 2023-06-27 — End: 2023-06-29

## 2023-06-27 MED ORDER — DOCUSATE SODIUM 100 MG PO CAPS: 100.0000 mg | ORAL_CAPSULE | Freq: Every day | ORAL | Status: DC

## 2023-06-27 MED ORDER — DEXAMETHASONE SODIUM PHOSPHATE 10 MG/ML IJ SOLN
INTRAMUSCULAR | Status: AC
  Filled 2023-06-27: qty 1

## 2023-06-27 MED ORDER — ENOXAPARIN SODIUM 60 MG/0.6ML IJ SOSY
50.0000 mg | PREFILLED_SYRINGE | INTRAMUSCULAR | Status: DC
  Administered 2023-06-28: 50 mg via SUBCUTANEOUS
  Filled 2023-06-27: qty 0.6

## 2023-06-27 MED ORDER — PRENATAL MULTIVITAMIN CH
1.0000 | ORAL_TABLET | Freq: Every day | ORAL | Status: DC
  Administered 2023-06-28 – 2023-06-29 (×2): 1 via ORAL
  Filled 2023-06-27 (×2): qty 1

## 2023-06-27 MED ORDER — OXYCODONE HCL 5 MG PO TABS: 5.0000 mg | ORAL_TABLET | Freq: Once | ORAL | Status: DC | PRN

## 2023-06-27 MED ORDER — TRANEXAMIC ACID-NACL 1000-0.7 MG/100ML-% IV SOLN
1000.0000 mg | Freq: Once | INTRAVENOUS | Status: AC
  Administered 2023-06-27: 1000 mg via INTRAVENOUS

## 2023-06-27 MED ORDER — TRANEXAMIC ACID-NACL 1000-0.7 MG/100ML-% IV SOLN
INTRAVENOUS | Status: AC
  Filled 2023-06-27: qty 100

## 2023-06-27 MED ORDER — PRENATAL MULTIVITAMIN CH: 1.0000 | ORAL_TABLET | Freq: Every day | ORAL | Status: DC

## 2023-06-27 MED ORDER — SENNOSIDES-DOCUSATE SODIUM 8.6-50 MG PO TABS
2.0000 | ORAL_TABLET | Freq: Every day | ORAL | Status: DC
  Administered 2023-06-28 – 2023-06-29 (×2): 2 via ORAL
  Filled 2023-06-27 (×2): qty 2

## 2023-06-27 MED ORDER — DEXAMETHASONE SODIUM PHOSPHATE 10 MG/ML IJ SOLN
INTRAMUSCULAR | Status: DC | PRN
  Administered 2023-06-27: 10 mg via INTRAVENOUS

## 2023-06-27 MED ORDER — SODIUM CHLORIDE 0.9 % IV SOLN: 250.0000 mL | INTRAVENOUS | Status: DC | PRN

## 2023-06-27 MED ORDER — MENTHOL 3 MG MT LOZG: 1.0000 | LOZENGE | OROMUCOSAL | Status: DC | PRN

## 2023-06-27 MED ORDER — GABAPENTIN 300 MG PO CAPS
300.0000 mg | ORAL_CAPSULE | Freq: Two times a day (BID) | ORAL | Status: DC
  Administered 2023-06-27 – 2023-06-29 (×4): 300 mg via ORAL
  Filled 2023-06-27 (×4): qty 1

## 2023-06-27 MED ORDER — MORPHINE SULFATE (PF) 0.5 MG/ML IJ SOLN
INTRAMUSCULAR | Status: AC
  Filled 2023-06-27: qty 10

## 2023-06-27 MED ORDER — SIMETHICONE 80 MG PO CHEW
80.0000 mg | CHEWABLE_TABLET | ORAL | Status: DC | PRN
  Administered 2023-06-28: 80 mg via ORAL

## 2023-06-27 SURGICAL SUPPLY — 27 items
BENZOIN TINCTURE PRP APPL 2/3 (GAUZE/BANDAGES/DRESSINGS) ×2 IMPLANT
CANISTER SUCT 3000ML PPV (MISCELLANEOUS) ×2 IMPLANT
CHLORAPREP W/TINT 26 (MISCELLANEOUS) ×4 IMPLANT
CLAMP UMBILICAL CORD (MISCELLANEOUS) ×2 IMPLANT
DRSG OPSITE POSTOP 4X10 (GAUZE/BANDAGES/DRESSINGS) ×2 IMPLANT
ELECTRODE REM PT RTRN 9FT ADLT (ELECTROSURGICAL) ×2 IMPLANT
EXTRACTOR VACUUM KIWI (MISCELLANEOUS) ×2 IMPLANT
GLOVE BIOGEL PI IND STRL 7.0 (GLOVE) ×4 IMPLANT
GLOVE BIOGEL PI IND STRL 7.5 (GLOVE) ×2 IMPLANT
GLOVE SURG SS PI 7.0 STRL IVOR (GLOVE) ×2 IMPLANT
GOWN STRL REUS W/ TWL LRG LVL3 (GOWN DISPOSABLE) ×4 IMPLANT
GOWN STRL REUS W/ TWL XL LVL3 (GOWN DISPOSABLE) ×2 IMPLANT
MAT PREVALON FULL STRYKER (MISCELLANEOUS) IMPLANT
NS IRRIG 1000ML POUR BTL (IV SOLUTION) ×2 IMPLANT
PACK C SECTION WH (CUSTOM PROCEDURE TRAY) ×2 IMPLANT
PAD ABD 7.5X8 STRL (GAUZE/BANDAGES/DRESSINGS) ×2 IMPLANT
PAD OB MATERNITY 4.3X12.25 (PERSONAL CARE ITEMS) ×2 IMPLANT
PAD PREP 24X48 CUFFED NSTRL (MISCELLANEOUS) ×2 IMPLANT
RETRACTOR WND ALEXIS 25 LRG (MISCELLANEOUS) ×2 IMPLANT
STRIP CLOSURE SKIN 1/2X4 (GAUZE/BANDAGES/DRESSINGS) ×2 IMPLANT
SUT MNCRL 0 VIOLET CTX 36 (SUTURE) ×4 IMPLANT
SUT MON AB 4-0 PS1 27 (SUTURE) ×2 IMPLANT
SUT PLAIN 2 0 XLH (SUTURE) ×2 IMPLANT
SUT VIC AB 0 CT1 36 (SUTURE) ×4 IMPLANT
SUT VIC AB 3-0 CT1 TAPERPNT 27 (SUTURE) ×2 IMPLANT
TOWEL OR 17X24 6PK STRL BLUE (TOWEL DISPOSABLE) ×4 IMPLANT
WATER STERILE IRR 1000ML POUR (IV SOLUTION) ×2 IMPLANT

## 2023-06-27 NOTE — Addendum Note (Signed)
 Addended by: Lenoard Rad A on: 06/27/2023 12:01 PM   Modules accepted: Orders

## 2023-06-27 NOTE — Anesthesia Procedure Notes (Signed)
 Spinal  Patient location during procedure: OB Start time: 06/27/2023 7:00 PM End time: 06/27/2023 7:04 PM Reason for block: surgical anesthesia Staffing Performed: anesthesiologist  Anesthesiologist: Rosalita Combe, MD Performed by: Rosalita Combe, MD Authorized by: Rosalita Combe, MD   Preanesthetic Checklist Completed: patient identified, IV checked, site marked, risks and benefits discussed, surgical consent, monitors and equipment checked, pre-op evaluation and timeout performed Spinal Block Patient position: sitting Prep: DuraPrep and site prepped and draped Patient monitoring: cardiac monitor, continuous pulse ox, blood pressure and heart rate Approach: midline Location: L3-4 Injection technique: catheter Needle Needle type: Tuohy and Sprotte  Needle gauge: 27 G Needle length: 12.7 cm Needle insertion depth: 7 cm Catheter type: closed end flexible Catheter size: 19 g Catheter at skin depth: 13 cm Assessment Sensory level: T4 Events: CSF return Additional Notes  1 Attempt (s). Pt tolerated procedure well.

## 2023-06-27 NOTE — Progress Notes (Addendum)
 Patient with persistent bleeding with wiping and low back pain. Baby is category I with no obvious contractions on EFM but given her being 35/3 weeks with bleeding I told her that at most, we'd keep her pregnant to 36wks but given her s/s I recommend delivery now. Risk of prematurity, including likely several week NICU admission and risk of c/s d/w her and she is amenable with proceeding. NPO since 0600 today   Patient also wants LARC, IUD. D/w her re: options and she would like paragard. Pt consented for this, as well.   NICU updated  Tyler Gallant MD Attending Center for [facility] (Faculty Practice) 06/27/2023 Time: [id number]

## 2023-06-27 NOTE — Discharge Summary (Signed)
 Postpartum Discharge Summary  Date of Service updated***     Patient Name: April James DOB: October 15, 1990 MRN: [id number]  Date of admission: 06/27/2023 Delivery date:06/27/2023 Delivering provider: Raynell Caller Date of discharge: 06/27/2023  Admitting diagnosis: Pregnancy at 35/3. Persistent bleeding in the setting of known previa.  Secondary diagnosis:  CHTN vs GHTN. GDMA1. BMI 30s     Discharge diagnosis: {DX.:23714}                                              Post partum procedures:*** Augmentation: N/A Complications: None  Hospital course: Patient direct admitted for VB in the setting of previa. While on antepartum, she had persistent VB with category I tracing. Given persistent bleeding at nearly 36wks, patient amenable to proceeding with c-section.  Details of operation can be found in separate operative note but patient had a 5/27 PLTCS and paragard IUD placement.    Patient had a postpartum course complicated by***.  She is ambulating, tolerating a regular diet, passing flatus, and urinating well. Patient is discharged home in stable condition on  06/27/23        Newborn Data: Birth date:06/27/2023 Birth time:7:26 PM Gender:Female Living status:Living Apgars:9 ,9  Weight:2150 g    Magnesium  Sulfate received: {Mag received:30440022} BMZ received: {BMZ received:30440023} Rhophylac:{Rhophylac received:30440032} WUJ:{WJX:91478295} T-DaP:{Tdap:23962} Flu: {AOZ:30865} RSV Vaccine received: {RSV:31013} Transfusion:{Transfusion received:30440034} Immunizations administered: Immunization History  Administered Date(s) Administered   Tdap 04/05/2022, 05/02/2023    Physical exam  Vitals:   06/27/23 1253 06/27/23 1334 06/27/23 1612  BP: 114/77  122/60  Pulse: 100  78  Resp: 18  16  Temp: (!) 97.5 F (36.4 C)  (!) 97.5 F (36.4 C)  TempSrc: Oral  Oral  SpO2: 98%  99%  Weight:  105.7 kg   Height:  5\' 1"  (1.549 m)    General: {Exam; general:21111117} Lochia:  {Desc; appropriate/inappropriate:30686::"appropriate"} Uterine Fundus: {Desc; firm/soft:30687} Incision: {Exam; incision:21111123} DVT Evaluation: {Exam; HQI:6962952} Labs: Lab Results  Component Value Date   WBC 17.1 (H) 06/27/2023   HGB 10.3 (L) 06/27/2023   HCT 30.4 (L) 06/27/2023   MCV 93.5 06/27/2023   PLT 450 (H) 06/27/2023      Latest Ref Rng & Units 06/27/2023    1:26 PM  CMP  Glucose 70 - 99 mg/dL 92   BUN 6 - 20 mg/dL 11   Creatinine 8.41 - 1.00 mg/dL 3.24   Sodium 401 - 027 mmol/L 134   Potassium 3.5 - 5.1 mmol/L 3.7   Chloride 98 - 111 mmol/L 107   CO2 22 - 32 mmol/L 18   Calcium  8.9 - 10.3 mg/dL 8.7   Total Protein 6.5 - 8.1 g/dL 6.7   Total Bilirubin 0.0 - 1.2 mg/dL 0.3   Alkaline Phos 38 - 126 U/L 148   AST 15 - 41 U/L 16   ALT 0 - 44 U/L 14    Edinburgh Score:    06/04/2022    3:35 PM  Edinburgh Postnatal Depression Scale Screening Tool  I have been able to laugh and see the funny side of things. 0  I have looked forward with enjoyment to things. 0  I have blamed myself unnecessarily when things went wrong. 0  I have been anxious or worried for no good reason. 2  I have felt scared or panicky for no good reason. 1  Things have been getting  on top of me. 0  I have been so unhappy that I have had difficulty sleeping. 1  I have felt sad or miserable. 0  I have been so unhappy that I have been crying. 1  The thought of harming myself has occurred to me. 0  Edinburgh Postnatal Depression Scale Total 5      After visit meds:  Allergies as of 06/27/2023       Reactions   Penicillins Hives, Shortness Of Breath   Sulfa Antibiotics Rash     Med Rec must be completed prior to using this [facility]***        Discharge home in stable condition Infant Feeding: {Baby feeding:23562} Infant Disposition:{CHL IP OB HOME WITH AVWUJW:11914} Discharge instruction: per After Visit Summary and Postpartum booklet. Activity: Advance as tolerated. Pelvic rest  for 6 weeks.  Diet: {OB diet:21111121} Anticipated Birth Control: {Birth Control:23956} Postpartum Appointment:{Outpatient follow up:23559} Additional Postpartum F/U: {PP Procedure:23957} Future Appointments: Future Appointments  Date Time Provider Department Center  07/04/2023 10:35 AM Constant, Carles Cheadle, MD [facility]  07/11/2023 10:35 AM Granville Layer, MD [facility]  07/18/2023 10:35 AM Izell Marsh, MD [facility]  07/25/2023 10:35 AM Granville Layer, MD [facility]   Follow up Visit:      06/27/2023 Raynell Caller, MD

## 2023-06-27 NOTE — Progress Notes (Signed)
   PRENATAL VISIT NOTE  Subjective:  April James is a 33 y.o. G6Y4034 at [redacted]w[redacted]d being seen today for ongoing prenatal care.  She is currently monitored for the following issues for this high-risk pregnancy and has Supervision of high risk pregnancy, antepartum; Tobacco use affecting pregnancy, antepartum; Maternal morbid obesity, antepartum (HCC); Bipolar disease in pregnancy (HCC); History of severe preeclampsia in prior pregnancy, currently pregnant; History of prior pregnancy with IUGR; Anxiety during pregnancy; Proteinuria during pregnancy; Placenta previa antepartum, third trimester; Gestational diabetes mellitus, antepartum; Hypertension in pregnancy; and BMI 40.0-44.9, adult (HCC) on their problem list.  Patient reports spotting that started when she was in the lobby waiting for her visit today.  Contractions: Not present. Vag. Bleeding: Scant.  Movement: Present. Denies leaking of fluid.   The following portions of the patient's history were reviewed and updated as appropriate: allergies, current medications, past family history, past medical history, past social history, past surgical history and problem list.   Objective:    Vitals:   06/27/23 1044  BP: 130/86  Pulse: 98  Weight: 233 lb (105.7 kg)   Fetal Status:  Fetal Heart Rate (bpm): 138   Movement: Present    General: Alert, oriented and cooperative. Patient is in no acute distress.  Skin: Skin is warm and dry. No rash noted.   Cardiovascular: Normal heart rate noted  Respiratory: Normal respiratory effort, no problems with respiration noted  Abdomen: Soft, gravid, appropriate for gestational age.  Pain/Pressure: Absent     Pelvic: Exam performed in the presence of a chaperone. Small amount of red blood noted in vaginal vault, no active bleeding noted from closed cervical os  but blood was noted  all around cervix.        Extremities: Normal range of motion.  Edema: None  Mental Status: Normal mood and affect. Normal  behavior. Normal judgment and thought content.   Assessment and Plan:  Pregnancy: [id number] at [redacted]w[redacted]d 1. Vaginal bleeding in pregnant patient with known placenta previa (Primary) 2. [redacted] weeks gestation of pregnancy 3. Supervision of high risk pregnancy, antepartum Patient sent to hospital for observation, aware that delivery will be indicated if bleeding continues. Advised to be totally NPO, she last had food around 0600.  [facility] Providers and Staff notified. Patient is aware that even if she stabilizes, she will be admitted as this is her second episode of bleeding. Will remain admitted until after delivery.  Future Appointments  Date Time Provider Department Center  06/28/2023  7:00 AM [facility] PROVIDER 1 [facility]  06/28/2023  7:30 AM [facility] US3 [facility]  07/03/2023  8:45 AM [facility] NST [facility]  07/04/2023 10:35 AM Constant, Peggy, MD [facility]  07/10/2023  8:45 AM [facility] NST [facility]  07/11/2023 10:35 AM Granville Layer, MD [facility]  07/18/2023 10:35 AM Izell Marsh, MD [facility]  07/25/2023 10:35 AM Granville Layer, MD [facility]    Lenoard Rad, MD

## 2023-06-27 NOTE — Lactation Note (Signed)
 This note was copied from a baby's chart. Lactation Consultation Note  Patient Name: April James [id number] Date: 06/27/2023 Age:32 hours Reason for consult: Initial assessment  MOB is formula feeding only. Please let LC team know if she is requiring assistance at any time.  Mother's Current Feeding Choice: Formula  Consult Status Consult Status: Complete Date: 06/27/23    Vernette Goo BS, IBCLC 06/27/2023, 9:02 PM

## 2023-06-27 NOTE — Progress Notes (Signed)
 During RN rounding, pt reported that bleeding has increased. None on pad at that time, scant bright red blood on pad that was changed. Pt states bleeding when wiping and RN noted scant blood on toilet in bathroom. Dr. Aquilla Knapp notified.

## 2023-06-27 NOTE — Progress Notes (Signed)
 Transferred to MB 417 via bed with infant in arms

## 2023-06-27 NOTE — Op Note (Signed)
 Operative Note   SURGERY DATE: 06/27/2023  PRE-OP DIAGNOSIS:  *Pregnancy at 35/3 *Persistent bleeding with known previa *Desire for long acting reversible contraception   POST-OP DIAGNOSIS: Same. delivered   PROCEDURE: primary low transverse cesarean section via Clint Dana skin incision with single layer uterine closure and paragard intrauterine device placement  SURGEON: Surgeons and Role:    * Raynell Caller, MD - Primary  ASSISTANT:    Darrow End, MD - Fellow    * Ebony Goldstein, MD - Fellow  An experienced assistant was required given the standard of surgical care given the complexity of the case.  This assistant was needed for exposure, dissection, suctioning, retraction, instrument exchange, assisting with delivery with administration of fundal pressure, and for overall help during the procedure.  ANESTHESIA: epidural  ESTIMATED BLOOD LOSS:  DRAINS: 50mL UOP via indwelling foley  TOTAL IV FLUIDS: 2000mL crystalloid  VTE PROPHYLAXIS: SCDs to bilateral lower extremities  ANTIBIOTICS: Clindamycin  and gentamycin, within 1 hour of skin incision  SPECIMENS: placenta to pathology  COMPLICATIONS: none  FINDINGS: No intra-abdominal adhesions were noted. Grossly normal uterus, tubes and ovaries. Clear amniotic fluid, cephalic,  female infant, weight 2150gm, APGARs 9/9, intact placenta.  PROCEDURE IN DETAIL: The patient was taken to the operating room where anesthesia was administered and normal fetal heart tones were confirmed. She was then prepped and draped in the normal fashion in the dorsal supine position with a leftward tilt.  After a time out was performed, a Clint Dana skin incision was made with the scalpel and carried through to the underlying layer of fascia. The fascia was then incised at the midline and the fascia separately bluntly, and the rectus muscles were then separated in the midline and the peritoneum bluntly. The bladder blade was inserted and  the vesicouterine peritoneum was identified and a low transverse hysterotomy was made with the scalpel until the endometrial cavity was breached and the amniotic sac ruptured, yielding clear amniotic fluid. This incision was extended bluntly and the infant's head, shoulders and body were delivered atraumatically.The cord was clamped x 2 and cut, and the infant was handed to the awaiting pediatricians, after delayed cord clamping was done.  The placenta was then gradually expressed from the uterus and then the uterus was cleared of all clots and debris. The hysterotomy was repaired with a running suture of 1-0 monocryl to achieve excellent hemostasis; prior to complete closure, the paragard IUD was placed and the strings teased down into the cervix.  The hysterotomy and all operative sites were reinspected and excellent hemostasis was noted.   The peritoneum was closed with a running stitch of 3-0 Vicryl. The fascia was reapproximated with 0 Vicryl in a simple running fashion. The subcutaneous layer was then reapproximated with interrupted sutures of 2-0 plain gut, and the skin was then closed with 4-0 monocryl, in a subcuticular fashion.  The patient  tolerated the procedure well. Sponge, lap, needle, and instrument counts were correct x 2. The patient was transferred to the recovery room awake, alert and breathing independently in stable condition.  Tyler Gallant MD Attending [facility])

## 2023-06-27 NOTE — H&P (Addendum)
 FACULTY PRACTICE ANTEPARTUM ADMISSION HISTORY AND PHYSICAL NOTE   History of Present Illness: April James is a 33 y.o. [id number] at [redacted]w[redacted]d admitted for vaginal bleeding in the setting of known placenta previa   She was seen in the office today and noted spotting. On exam by Dr. Thurmon [place], she was noted to have small amount of red blood in vault without active bleeding. She was sent for admission.  She reports that bleeding is about the same as it was earlier in the office. Notes some mild cramping/back pain. Unsure if these are contractions.  Patient Active Problem List   Diagnosis Date Noted   Placenta previa with hemorrhage in third trimester 06/27/2023   BMI 40.0-44.9, adult (HCC) 06/14/2023   Hypertension in pregnancy 05/30/2023   Gestational diabetes mellitus, antepartum 05/04/2023   Placenta previa antepartum, third trimester 04/04/2023   Proteinuria during pregnancy 03/13/2023   History of severe preeclampsia in prior pregnancy, currently pregnant 03/09/2023   History of prior pregnancy with IUGR 03/09/2023   Anxiety during pregnancy 03/09/2023   Maternal morbid obesity, antepartum (HCC) 03/08/2022   Bipolar disease in pregnancy (HCC) 03/08/2022   Supervision of high risk pregnancy, antepartum 01/13/2022   Tobacco use affecting pregnancy, antepartum 01/13/2022    Past Medical History:  Diagnosis Date   Alcohol use disorder, mild, abuse 05/03/2015   Asthma    Bipolar 1 disorder (HCC)    Chlamydia infection affecting pregnancy in second trimester 01/19/2022   Toc neg early feb 2024   Gestational diabetes mellitus, antepartum 05/04/2023   Hypertension in pregnancy 05/30/2023   Started on Nifedipine  for BP 130s/80s by Dr. Emmette Harms, history of severe preeclampsia in prior pregnancy   Preeclampsia, severe 06/01/2022    Past Surgical History:  Procedure Laterality Date   CHOLECYSTECTOMY     TONSILLECTOMY      OB History  Gravida Para Term Preterm AB Living  5 2 2  0 2 2  SAB  IAB Ectopic Multiple Live Births  1 1 0 0 2    # Outcome Date GA Lbr Len/2nd Weight Sex Type Anes PTL Lv  5 Current           4 Term 05/25/22 [redacted]w[redacted]d 09:48 / 00:12 2230 g M Vag-Spont EPI  LIV  3 SAB 2023          2 IAB 2015          1 Term 12/20/08    M Vag-Spont   LIV    Social History   Socioeconomic History   Marital status: Significant Other    Spouse name: Not on file   Number of children: Not on file   Years of education: Not on file   Highest education level: Not on file  Occupational History   Not on file  Tobacco Use   Smoking status: Former    Current packs/day: 0.20    Average packs/day: 0.2 packs/day for 16.0 years (3.2 ttl pk-yrs)    Types: Cigarettes    Passive exposure: Current   Smokeless tobacco: Never  Vaping Use   Vaping status: Former   Substances: Flavoring  Substance and Sexual Activity   Alcohol use: Not Currently    Comment: stopped for now   Drug use: Not Currently    Types: Marijuana    Comment: "Not very often". Last used: 2 months ago.    Sexual activity: Yes    Birth control/protection: None  Other Topics Concern   Not on file  Social History Narrative  Not on file   Social Drivers of Health   Financial Resource Strain: Not on file  Food Insecurity: No Food Insecurity (06/27/2023)   Hunger Vital Sign    Worried About Running Out of Food in the Last Year: Never true    Ran Out of Food in the Last Year: Never true  Transportation Needs: No Transportation Needs (06/27/2023)   PRAPARE - Administrator, Civil Service (Medical): No    Lack of Transportation (Non-Medical): No  Physical Activity: Not on file  Stress: Not on file  Social Connections: Not on file    Family History  Problem Relation Age of Onset   Bipolar disorder Father    Heart disease Father    Aneurysm Father    Asthma Son    Diabetes Maternal Grandfather    Diabetes Paternal Grandmother    Cancer Paternal Grandmother    Hypertension Paternal  Grandfather    Stroke Neg Hx     Allergies  Allergen Reactions   Penicillins Hives and Shortness Of Breath   Sulfa Antibiotics Rash    Medications Prior to Admission  Medication Sig Dispense Refill Last Dose/Taking   aspirin  EC 81 MG tablet Take 2 tablets (162 mg total) by mouth at bedtime. Start taking when you are [redacted] weeks pregnant for rest of pregnancy for prevention of preeclampsia 300 tablet 2 06/27/2023   hydrOXYzine  (ATARAX ) 25 MG tablet Take 1-2 tablets (25-50 mg total) by mouth every 6 (six) hours as needed for anxiety. 60 tablet 3 Past Week   NIFEdipine  (PROCARDIA -XL/NIFEDICAL-XL) 30 MG 24 hr tablet Take 1 tablet (30 mg total) by mouth 2 (two) times daily. 60 tablet 3 06/27/2023 Morning   ondansetron  (ZOFRAN -ODT) 4 MG disintegrating tablet Take 1 tablet (4 mg total) by mouth every 6 (six) hours as needed for nausea. 20 tablet 1 06/26/2023 Morning   Prenatal Vit-Fe Fumarate-FA (MULTIVITAMIN-PRENATAL) 27-0.8 MG TABS tablet Take 1 tablet by mouth daily at 12 noon.   06/27/2023 Morning   Accu-Chek Softclix Lancets lancets 1 each by Other route 4 (four) times daily. 100 each 12    Blood Glucose Monitoring Suppl (ACCU-CHEK GUIDE) w/Device KIT 1 Device by Does not apply route 4 (four) times daily. 1 kit 0    glucose blood test strip Use as instructed 100 each 12     Review of Systems - Negative except per HPI  Vitals:  BP 114/77 (BP Location: Right Arm)   Pulse 100   Temp (!) 97.5 F (36.4 C) (Oral)   Resp 18   Ht 5\' 1"  (1.549 m)   Wt 105.7 kg   LMP 10/10/2022 (Approximate)   SpO2 98%   BMI 44.02 kg/m  Physical Examination: CONSTITUTIONAL: Well-developed, well-nourished female in no acute distress.  HENT:  Normocephalic, atraumatic, External right and left ear normal. Oropharynx is clear and moist EYES: Conjunctivae and EOM are normal. Pupils are equal, round, and reactive to light. No scleral icterus.  NECK: Normal range of motion, supple, no masses SKIN: Skin is warm and dry.  No rash noted. Not diaphoretic. No erythema. No pallor. NEUROLOGIC: Alert and oriented to person, place, and time. Normal reflexes, muscle tone coordination. No cranial nerve deficit noted. PSYCHIATRIC: Normal mood and affect. Normal behavior. Normal judgment and thought content. CARDIOVASCULAR: Normal heart rate noted  RESPIRATORY: Effort and breath sounds normal, no problems with respiration noted ABDOMEN: Soft, nontender, nondistended, gravid. MUSCULOSKELETAL: Normal range of motion. No edema and no tenderness. 2+ distal pulses.  Pelvic  exam per Dr. Thurmon Florida in the office: Pelvic: Exam performed in the presence of a chaperone. Small amount of red blood noted in vaginal vault, no active bleeding noted from closed cervical os  but blood was noted  all around cervix.           Fetal Monitoring: 120s, moderate variability, +accels, no decels Tocometer: irritability  Labs:  Results for orders placed or performed during the hospital encounter of 06/27/23 (from the past 24 hours)  Type and screen [facility]   Collection Time: 06/27/23  1:21 PM  Result Value Ref Range   ABO/RH(D) PENDING    Antibody Screen PENDING    Sample Expiration      06/30/2023,2359 Performed at [facility], [address], [place], [place] [address]   CBC   Collection Time: 06/27/23  1:26 PM  Result Value Ref Range   WBC 17.1 (H) 4.0 - 10.5 K/uL   RBC 3.25 (L) 3.87 - 5.11 MIL/uL   Hemoglobin 10.3 (L) 12.0 - 15.0 g/dL   HCT 29.5 (L) 28.4 - 13.2 %   MCV 93.5 80.0 - 100.0 fL   MCH 31.7 26.0 - 34.0 pg   MCHC 33.9 30.0 - 36.0 g/dL   RDW 44.0 10.2 - 72.5 %   Platelets 450 (H) 150 - 400 K/uL   nRBC 0.0 0.0 - 0.2 %    Imaging Studies: US  MFM FETAL BPP WO NON STRESS Result Date: 06/19/2023 ----------------------------------------------------------------------  OBSTETRICS REPORT                       (Signed Final 06/19/2023 08:45 am)  ---------------------------------------------------------------------- Patient Info  ID #:       [id number]                          D.O.B.:  1990-12-20 (33 yrs)(F)  Name:       April James                   Visit Date: 06/19/2023 07:19 am ---------------------------------------------------------------------- Performed By  Attending:        Cassandria Clever MD        Ref. Address:     [address]                                                             [place], [place]                                                             [address]  Performed By:     Earnest Goad            [facility]  Referred By:      Julianne Octave MD ---------------------------------------------------------------------- Orders  #  Description                           Code        Ordered By  1  US  MFM FETAL BPP WO NON               76819.01    YU FANG     STRESS  2  US  MFM OB FOLLOW UP                   76816.01    YU FANG ----------------------------------------------------------------------  #  Order #                     Accession #                Episode #  1  [id number]                   [id number]                 [id number]  2  [id number]                   [id number]                 [id number] ---------------------------------------------------------------------- Indications  Placenta previa specified as without           O44.03  hemorrhage, third trimester  Obesity complicating pregnancy, third          O99.213  trimester (40)  Gestational diabetes in pregnancy, diet        O24.410  controlled  Gestational hypertension without significant   O13.3  proteinuria, third trimester  Short interval between pregnancies, 3rd        O09.893  trimester  Poor obstetric history: Previous                O09.299  preeclampsia / eclampsia/gestational HTN  Poor obstetric history: Previous fetal growth  O09.299  restriction (FGR)  Tobacco use complicating pregnancy,            O99.332  second trimester(pt states quit)  Medical complication of pregnancy (anxiety)    O26.90  [redacted] weeks gestation of pregnancy                Z3A.34 ---------------------------------------------------------------------- Fetal Evaluation  Num Of Fetuses:         1  Fetal Heart Rate(bpm):  129  Cardiac Activity:       Observed  Presentation:           Cephalic  Placenta:               Posterior previa  P. Cord Insertion:      Previously seen  Amniotic Fluid  AFI FV:      Within normal limits  AFI Sum(cm)     %Tile       Largest Pocket(cm)  14.44           51          5.84  RUQ(cm)       RLQ(cm)       LUQ(cm)        LLQ(cm)  5.84          3.18          2.57           2.85 ---------------------------------------------------------------------- Biophysical Evaluation  Amniotic F.V:   Within normal limits       F. Tone:        Observed  F. Movement:    Observed                   Score:          8/8  F. Breathing:   Observed ---------------------------------------------------------------------- Biometry  BPD:      74.2  mm     G. Age:  29w 5d        < 1  %    CI:        67.34   %    70 - 86                                                          FL/HC:      22.1   %    19.4 - 21.8  HC:      289.5  mm     G. Age:  31w 6d        < 1  %    HC/AC:      0.89        0.96 - 1.11  AC:      325.3  mm     G. Age:  36w 3d         96  %    FL/BPD:     86.1   %    71 - 87  FL:       63.9  mm     G. Age:  33w 0d         13  %    FL/AC:      19.6   %    20 - 24  LV:        2.2  mm  Est. FW:    2404  gm      5 lb 5 oz     45  % ---------------------------------------------------------------------- OB History  Gravidity:    5         Term:   2         SAB:   2  Living:       2 ---------------------------------------------------------------------- Gestational Age   LMP:           36w 0d        Date:  10/10/22                   EDD:   07/17/23  U/S Today:     32w 5d                                        EDD:   08/09/23  Best:          34w 2d     Det. By:  U/S  (04/03/23)          EDD:   07/29/23 ---------------------------------------------------------------------- Targeted Anatomy  Central Nervous System  Calvarium/Cranial V.:  Appears normal         Cereb./Vermis:          Previously seen  Cavum:                 Appears normal         Cisterna Magna:         Previously seen  Lateral Ventricles:    Appears normal         Midline Falx:           Previously seen  Choroid Plexus:        Previously seen  Spine  Cervical:              Not well visualized    Sacral:                 Not well visualized  Thoracic:              Not well visualized    Shape/Curvature:        Not well visualized  Lumbar:                Not well visualized  Head/Neck  Lips:                  Previously seen        Profile:                Appears normal  Neck:                  Previously seen        Orbits/Eyes:            Previously seen  Nuchal Fold:           Previously seen        Mandible:               Previously seen  Nasal Bone:            Appears normal         Maxilla:                Previously seen  Thorax  4 Chamber View:        Appears normal         Interventr. Septum:     Not well visualized  Cardiac Rhythm:        Normal                 Cardiac Axis:           Previously seen  Cardiac Situs:         Previously seen        Diaphragm:              Appears  normal  Rt Outflow Tract:      Previously seen        3 Vessel View:          Previously seen  Lt Outflow Tract:      Previously seen        3 V Trachea View:       Previously seen  Aortic Arch:           Previously seen        IVC:                    Not well visualized  Ductal Arch:           Previously seen        Crossing:               Previously seen  SVC:                   Not well visualized  Abdomen  Ventral Wall:          Previously seen         Lt Kidney:              Appears normal  Cord Insertion:        Previously seen        Rt Kidney:              Appears normal  Situs:                 Previously seen        Bladder:                Appears normal  Stomach:               Appears normal  Extremities  Lt Humerus:            Previously seen        Lt Femur:               Previously seen  Rt Humerus:            Previously seen        Rt Femur:               Previously seen  Lt Forearm:            Previously seen        Lt Lower Leg:           Previously seen  Rt Forearm:            Previously seen        Rt Lower Leg:           Previously seen  Lt Hand:               Previously seen        Lt Foot:                Previously seen  Rt Hand:               Previously seen        Rt Foot:                Previously seen  Other  Umbilical Cord:        Previously seen        Genitalia:              Female prev seen ---------------------------------------------------------------------- Cervix  Uterus Adnexa  Cervix  Not visualized (advanced GA >24wks)  Uterus  No abnormality visualized.  Right Ovary  Not visualized.  Left Ovary  Not visualized.  Cul De Sac  No free fluid seen.  Adnexa  No adnexal mass visualized ---------------------------------------------------------------------- Impression  G5 P2022 at 34w 2d gestation.  -Placenta previa.  Patient does not give history of vaginal  bleeding.  - Gestational diabetes.  Reportedly well-controlled on diet.  - New diagnosis of gestational hypertension.  Patient does  not have signs and symptoms of severe features of  preeclampsia.  Blood pressure today at our office is 135/69  mmHg.  Ultrasound  Fetal growth is appropriate for gestational age.  Amniotic fluid  is normal good fetal activity seen.  Cephalic presentation.  Antenatal testing is reassuring.  BPP 8/8.  On transabdominal scan, placenta previa is clearly seen with  placenta covering the internal os.  Placenta previa  I discussed the persistence of  placenta previa with the help of  ultrasound images.  Because of increased risk of vaginal  bleeding beyond [redacted] weeks gestation, we recommend delivery  at [redacted] weeks gestation.  Delivery at [redacted] weeks gestation quick considered if patient has  vaginal bleeding.  Gestational hypertension  This is the most probable diagnosis considering she did not  have chronic hypertension.  Discussed the possible  complications of gestational hypertension including  preeclampsia/eclampsia/endorgan damage/placental  abruption ---------------------------------------------------------------------- Recommendations  -Continue weekly BPP/NST till delivery.  - Delivery at [redacted] weeks gestation. ----------------------------------------------------------------------                 Cassandria Clever, MD Electronically Signed Final Report   06/19/2023 08:45 am ----------------------------------------------------------------------   US  MFM OB FOLLOW UP Result Date: 06/19/2023 ----------------------------------------------------------------------  OBSTETRICS REPORT                       (Signed Final 06/19/2023 08:45 am) ---------------------------------------------------------------------- Patient Info  ID #:       [id number]                          D.O.B.:  31-Jan-1991 (33 yrs)(F)  Name:       April James                   Visit Date: 06/19/2023 07:19 am ---------------------------------------------------------------------- Performed By  Attending:        Cassandria Clever MD        Ref. Address:     [address]                                                             [place], [place]                                                             [address]  Performed By:     Earnest Goad            Location[facility]  Referred By:       Julianne Octave MD ---------------------------------------------------------------------- Orders  #  Description                           Code        Ordered By  1  US  MFM FETAL BPP WO NON               76819.01    YU FANG     STRESS  2  US  MFM OB FOLLOW UP                   76816.01    YU FANG ----------------------------------------------------------------------  #  Order #                     Accession #                Episode #  1  [id number]                   [id number]                 [id number]  2  [id number]                   [id number]                 [id number] ---------------------------------------------------------------------- Indications  Placenta previa specified as without           O44.03  hemorrhage, third trimester  Obesity complicating pregnancy, third          O99.213  trimester (40)  Gestational diabetes in pregnancy, diet        O24.410  controlled  Gestational hypertension without significant   O13.3  proteinuria, third trimester  Short interval between pregnancies, 3rd        O09.893  trimester  Poor obstetric history: Previous               O09.299  preeclampsia / eclampsia/gestational HTN  Poor obstetric history: Previous fetal growth  O09.299  restriction (FGR)  Tobacco use complicating pregnancy,            O99.332  second trimester(pt states quit)  Medical complication of pregnancy (anxiety)    O26.90  [redacted] weeks gestation of pregnancy  Z3A.34 ---------------------------------------------------------------------- Fetal Evaluation  Num Of Fetuses:         1  Fetal Heart Rate(bpm):  129  Cardiac Activity:       Observed  Presentation:           Cephalic  Placenta:               Posterior previa  P. Cord Insertion:      Previously seen  Amniotic Fluid  AFI FV:      Within normal limits  AFI Sum(cm)     %Tile       Largest Pocket(cm)  14.44           51          5.84  RUQ(cm)       RLQ(cm)       LUQ(cm)        LLQ(cm)  5.84          3.18          2.57            2.85 ---------------------------------------------------------------------- Biophysical Evaluation  Amniotic F.V:   Within normal limits       F. Tone:        Observed  F. Movement:    Observed                   Score:          8/8  F. Breathing:   Observed ---------------------------------------------------------------------- Biometry  BPD:      74.2  mm     G. Age:  29w 5d        < 1  %    CI:        67.34   %    70 - 86                                                          FL/HC:      22.1   %    19.4 - 21.8  HC:      289.5  mm     G. Age:  31w 6d        < 1  %    HC/AC:      0.89        0.96 - 1.11  AC:      325.3  mm     G. Age:  36w 3d         96  %    FL/BPD:     86.1   %    71 - 87  FL:       63.9  mm     G. Age:  33w 0d         13  %    FL/AC:      19.6   %    20 - 24  LV:        2.2  mm  Est. FW:    2404  gm      5 lb 5 oz     45  % ---------------------------------------------------------------------- OB History  Gravidity:    5         Term:   2         SAB:   2  Living:       2 ---------------------------------------------------------------------- Gestational Age  LMP:           36w 0d        Date:  10/10/22                   EDD:   07/17/23  U/S Today:     32w 5d                                        EDD:   08/09/23  Best:          34w 2d     Det. By:  U/S  (04/03/23)          EDD:   07/29/23 ---------------------------------------------------------------------- Targeted Anatomy  Central Nervous System  Calvarium/Cranial V.:  Appears normal         Cereb./Vermis:          Previously seen  Cavum:                 Appears normal         Cisterna Magna:         Previously seen  Lateral Ventricles:    Appears normal         Midline Falx:           Previously seen  Choroid Plexus:        Previously seen  Spine  Cervical:              Not well visualized    Sacral:                 Not well visualized  Thoracic:              Not well visualized    Shape/Curvature:        Not well visualized  Lumbar:                 Not well visualized  Head/Neck  Lips:                  Previously seen        Profile:                Appears normal  Neck:                  Previously seen        Orbits/Eyes:            Previously seen  Nuchal Fold:           Previously seen        Mandible:               Previously seen  Nasal Bone:            Appears normal         Maxilla:                Previously seen  Thorax  4 Chamber View:        Appears normal         Interventr. Septum:     Not well visualized  Cardiac Rhythm:        Normal                 Cardiac Axis:           Previously seen  Cardiac Situs:         Previously seen        Diaphragm:              Appears normal  Rt Outflow Tract:      Previously seen        3 Vessel View:          Previously seen  Lt Outflow Tract:      Previously seen        3 V Trachea View:       Previously seen  Aortic Arch:           Previously seen        IVC:                    Not well visualized  Ductal Arch:           Previously seen        Crossing:               Previously seen  SVC:                   Not well visualized  Abdomen  Ventral Wall:          Previously seen        Lt Kidney:              Appears normal  Cord Insertion:        Previously seen        Rt Kidney:              Appears normal  Situs:                 Previously seen        Bladder:                Appears normal  Stomach:               Appears normal  Extremities  Lt Humerus:            Previously seen        Lt Femur:               Previously seen  Rt Humerus:            Previously seen        Rt Femur:               Previously seen  Lt Forearm:            Previously seen        Lt Lower Leg:           Previously seen  Rt Forearm:            Previously seen        Rt Lower Leg:           Previously seen  Lt Hand:               Previously seen        Lt Foot:                Previously seen  Rt Hand:               Previously seen        Rt Foot:                Previously seen  Other  Umbilical Cord:  Previously seen         Genitalia:              Female prev seen ---------------------------------------------------------------------- Cervix Uterus Adnexa  Cervix  Not visualized (advanced GA >24wks)  Uterus  No abnormality visualized.  Right Ovary  Not visualized.  Left Ovary  Not visualized.  Cul De Sac  No free fluid seen.  Adnexa  No adnexal mass visualized ---------------------------------------------------------------------- Impression  G5 P2022 at 34w 2d gestation.  -Placenta previa.  Patient does not give history of vaginal  bleeding.  - Gestational diabetes.  Reportedly well-controlled on diet.  - New diagnosis of gestational hypertension.  Patient does  not have signs and symptoms of severe features of  preeclampsia.  Blood pressure today at our office is 135/69  mmHg.  Ultrasound  Fetal growth is appropriate for gestational age.  Amniotic fluid  is normal good fetal activity seen.  Cephalic presentation.  Antenatal testing is reassuring.  BPP 8/8.  On transabdominal scan, placenta previa is clearly seen with  placenta covering the internal os.  Placenta previa  I discussed the persistence of placenta previa with the help of  ultrasound images.  Because of increased risk of vaginal  bleeding beyond [redacted] weeks gestation, we recommend delivery  at [redacted] weeks gestation.  Delivery at [redacted] weeks gestation quick considered if patient has  vaginal bleeding.  Gestational hypertension  This is the most probable diagnosis considering she did not  have chronic hypertension.  Discussed the possible  complications of gestational hypertension including  preeclampsia/eclampsia/endorgan damage/placental  abruption ---------------------------------------------------------------------- Recommendations  -Continue weekly BPP/NST till delivery.  - Delivery at [redacted] weeks gestation. ----------------------------------------------------------------------                 Cassandria Clever, MD Electronically Signed Final Report   06/19/2023 08:45 am  ----------------------------------------------------------------------     Assessment and Plan: Vaginal bleeding with placenta previa: bleed #2 - admit to antepartum - close observation for signs/symptoms of worsening bleeding and consider delivery if persistent/worsening - otherwise, admission until delivery  2. FWB: continuous monitoring for now - s/p BMZ 3/12-3/13 - NICU consult  3. HTN: gestational vs chronic, meds started at 24 wks - procardia  30 mg BID  4. GDMA1: glucose monitoring  Marci Setter, MD, FACOG Obstetrician & Gynecologist, [facility] for [facility], [facility]

## 2023-06-27 NOTE — Anesthesia Postprocedure Evaluation (Signed)
 Anesthesia Post Note  Patient: April James  Procedure(s) Performed: CESAREAN DELIVERY INSERTION, INTRAUTERINE DEVICE (Bilateral)     Patient location during evaluation: Mother Baby Anesthesia Type: Spinal Level of consciousness: oriented and awake and alert Pain management: pain level controlled Vital Signs Assessment: post-procedure vital signs reviewed and stable Respiratory status: spontaneous breathing and respiratory function stable Cardiovascular status: blood pressure returned to baseline and stable Postop Assessment: no headache, no backache, no apparent nausea or vomiting and able to ambulate Anesthetic complications: no  No notable events documented.  Last Vitals:  Vitals:   06/27/23 2110 06/27/23 2115  BP:  118/67  Pulse: 74 66  Resp: 17 13  Temp:    SpO2: 99% 100%    Last Pain:  Vitals:   06/27/23 2103  TempSrc:   PainSc: 8    Pain Goal:                Epidural/Spinal Function Cutaneous sensation: Able to Wiggle Toes (06/27/23 2110), Patient able to flex knees: Yes (06/27/23 2110), Patient able to lift hips off bed: Yes (06/27/23 2110), Back pain beyond tenderness at insertion site: No (06/27/23 2110), Progressively worsening motor and/or sensory loss: No (06/27/23 2110), Bowel and/or bladder incontinence post epidural: No (06/27/23 2110)  Rosalita Combe

## 2023-06-27 NOTE — Anesthesia Preprocedure Evaluation (Addendum)
 Anesthesia Evaluation  Patient identified by MRN, date of birth, ID band Patient awake    Reviewed: Allergy & Precautions, NPO status , Patient's Chart, lab work & pertinent test results  Airway Mallampati: II  TM Distance: >3 FB Neck ROM: Full    Dental no notable dental hx. (+) Teeth Intact, Poor Dentition   Pulmonary asthma , Current Smoker, former smoker   Pulmonary exam normal breath sounds clear to auscultation       Cardiovascular hypertension, Normal cardiovascular exam Rhythm:Regular Rate:Normal     Neuro/Psych   Anxiety  Bipolar Disorder      GI/Hepatic   Endo/Other  diabetes, Gestational    Renal/GU      Musculoskeletal   Abdominal  (+) + obese (BMI 44)  Peds  Hematology Lab Results      Component                Value               Date                      WBC                      17.1 (H)            06/27/2023                HGB                      10.3 (L)            06/27/2023                HCT                      30.4 (L)            06/27/2023                MCV                      93.5                06/27/2023                PLT                      450 (H)             06/27/2023            T&S available    Anesthesia Other Findings All: PCN sulfa  Reproductive/Obstetrics (+) Pregnancy Placenta previa currently w vaginal bleeding  IUGR                             Anesthesia Physical Anesthesia Plan  ASA: 3  Anesthesia Plan: Combined Spinal and Epidural   Post-op Pain Management: Regional block* and Minimal or no pain anticipated   Induction:   PONV Risk Score and Plan: Treatment may vary due to age or medical condition  Airway Management Planned: Natural Airway and Nasal Cannula  Additional Equipment: None  Intra-op Plan:   Post-operative Plan:   Informed Consent: I have reviewed the patients History and Physical, chart, labs and discussed the  procedure including the risks, benefits and alternatives for the proposed anesthesia with the patient or  authorized representative who has indicated his/her understanding and acceptance.     Dental advisory given  Plan Discussed with: CRNA and Surgeon  Anesthesia Plan Comments: (35.3 g5P3 w gHtn, gDm, Placentia Previa, BMI 44 IUGR and Bipolar for primary C/S)       Anesthesia Quick Evaluation

## 2023-06-27 NOTE — Transfer of Care (Signed)
 Immediate Anesthesia Transfer of Care Note  Patient: April James  Procedure(s) Performed: CESAREAN DELIVERY INSERTION, INTRAUTERINE DEVICE (Bilateral)  Patient Location: PACU  Anesthesia Type:Spinal  Level of Consciousness: awake, alert , oriented, and patient cooperative  Airway & Oxygen Therapy: Patient Spontanous Breathing  Post-op Assessment: Report given to RN and Post -op Vital signs reviewed and stable  Post vital signs: Reviewed and stable  Last Vitals:  Vitals Value Taken Time  BP 108/71 06/27/23 2030  Temp 36.4 C 06/27/23 2025  Pulse 69 06/27/23 2031  Resp 15 06/27/23 2031  SpO2 98 % 06/27/23 2031  Vitals shown include unfiled device data.  Last Pain:  Vitals:   06/27/23 2025  TempSrc: Oral  PainSc: 3          Complications: No notable events documented.

## 2023-06-27 NOTE — Discharge Instructions (Signed)
Continue to check your blood pressures twice a day Call the office for blood pressures that are consistently above 140 for the top number or 90 for the bottom number   Hypertension During Pregnancy Hypertension is also called high blood pressure. High blood pressure means that the force of your blood moving in your body is too strong. It can cause problems for you and your baby. Different types of high blood pressure can happen during pregnancy. The types are: High blood pressure before you got pregnant. This is called chronic hypertension.  This can continue during your pregnancy. Your doctor will want to keep checking your blood pressure. You may need medicine to keep your blood pressure under control while you are pregnant. You will need follow-up visits after you have your baby. High blood pressure that goes up during pregnancy when it was normal before. This is called gestational hypertension. It will usually get better after you have your baby, but your doctor will need to watch your blood pressure to make sure that it is getting better. Very high blood pressure during pregnancy. This is called preeclampsia. Very high blood pressure is an emergency that needs to be checked and treated right away. You may develop very high blood pressure after giving birth. This is called postpartum preeclampsia. This usually occurs within 48 hours after childbirth but may occur up to 6 weeks after giving birth. This is rare. How does this affect me? If you have high blood pressure during pregnancy, you have a higher chance of developing high blood pressure: As you get older. If you get pregnant again. In some cases, high blood pressure during pregnancy can cause: Stroke. Heart attack. Damage to the kidneys, lungs, or liver. Preeclampsia. Jerky movements you cannot control (convulsions or seizures). Problems with the placenta.  What can I do to lower my risk?  Keep a healthy weight. Eat a healthy  diet. Follow what your doctor tells you about treating any medical problems that you had before becoming pregnant. It is very important to go to all of your doctor visits. Your doctor will check your blood pressure and make sure that your pregnancy is progressing as it should. Treatment should start early if a problem is found.  Follow these instructions at home:  Take your blood pressure 1-2 times per day. Call the office if your blood pressure is 155 or higher for the top number or 105 or higher for the bottom number.    Eating and drinking  Drink enough fluid to keep your pee (urine) pale yellow. Avoid caffeine. Lifestyle Do not use any products that contain nicotine or tobacco, such as cigarettes, e-cigarettes, and chewing tobacco. If you need help quitting, ask your doctor. Do not use alcohol or drugs. Avoid stress. Rest and get plenty of sleep. Regular exercise can help. Ask your doctor what kinds of exercise are best for you. General instructions Take over-the-counter and prescription medicines only as told by your doctor. Keep all prenatal and follow-up visits as told by your doctor. This is important. Contact a doctor if: You have symptoms that your doctor told you to watch for, such as: Headaches. Nausea. Vomiting. Belly (abdominal) pain. Dizziness. Light-headedness. Get help right away if: You have: Very bad belly pain that does not get better with treatment. A very bad headache that does not get better. Vomiting that does not get better. Sudden, fast weight gain. Sudden swelling in your hands, ankles, or face. Blood in your pee. Blurry vision. Double vision.  Shortness of breath. Chest pain. Weakness on one side of your body. Trouble talking. Summary High blood pressure is also called hypertension. High blood pressure means that the force of your blood moving in your body is too strong. High blood pressure can cause problems for you and your baby. Keep all  follow-up visits as told by your doctor. This is important. This information is not intended to replace advice given to you by your health care provider. Make sure you discuss any questions you have with your health care provider. Document Released: 02/19/2010 Document Revised: 05/10/2018 Document Reviewed: 02/13/2018 Elsevier Patient Education  2020 [employer].

## 2023-06-28 ENCOUNTER — Ambulatory Visit

## 2023-06-28 ENCOUNTER — Encounter (HOSPITAL_COMMUNITY): Payer: Self-pay | Admitting: Obstetrics and Gynecology

## 2023-06-28 LAB — CERVICOVAGINAL ANCILLARY ONLY
Bacterial Vaginitis (gardnerella): POSITIVE — AB
Candida Glabrata: NEGATIVE
Candida Vaginitis: NEGATIVE
Chlamydia: NEGATIVE
Comment: NEGATIVE
Comment: NEGATIVE
Comment: NEGATIVE
Comment: NEGATIVE
Comment: NEGATIVE
Comment: NORMAL
Neisseria Gonorrhea: NEGATIVE
Trichomonas: NEGATIVE

## 2023-06-28 LAB — CBC
HCT: 24.6 % — ABNORMAL LOW (ref 36.0–46.0)
Hemoglobin: 8.5 g/dL — ABNORMAL LOW (ref 12.0–15.0)
MCH: 31.6 pg (ref 26.0–34.0)
MCHC: 34.6 g/dL (ref 30.0–36.0)
MCV: 91.4 fL (ref 80.0–100.0)
Platelets: 393 10*3/uL (ref 150–400)
RBC: 2.69 MIL/uL — ABNORMAL LOW (ref 3.87–5.11)
RDW: 13.2 % (ref 11.5–15.5)
WBC: 17.9 10*3/uL — ABNORMAL HIGH (ref 4.0–10.5)
nRBC: 0 % (ref 0.0–0.2)

## 2023-06-28 LAB — CREATININE, SERUM
Creatinine, Ser: 0.54 mg/dL (ref 0.44–1.00)
GFR, Estimated: >60 mL/min (ref >60)

## 2023-06-28 LAB — RPR: RPR Ser Ql: NONREACTIVE

## 2023-06-28 LAB — GLUCOSE, CAPILLARY: Glucose-Capillary: 89 mg/dL (ref 70–99)

## 2023-06-28 MED ORDER — DIPHENHYDRAMINE HCL 50 MG/ML IJ SOLN: 25.0000 mg | Freq: Once | INTRAMUSCULAR | Status: DC | PRN

## 2023-06-28 MED ORDER — SODIUM CHLORIDE 0.9 % IV BOLUS: 500.0000 mL | Freq: Once | INTRAVENOUS | Status: DC | PRN

## 2023-06-28 MED ORDER — POTASSIUM CHLORIDE CRYS ER 20 MEQ PO TBCR
20.0000 meq | EXTENDED_RELEASE_TABLET | Freq: Every day | ORAL | Status: DC
  Administered 2023-06-29: 20 meq via ORAL
  Filled 2023-06-28: qty 1

## 2023-06-28 MED ORDER — FUROSEMIDE 20 MG PO TABS
20.0000 mg | ORAL_TABLET | Freq: Every day | ORAL | Status: DC
  Administered 2023-06-29: 20 mg via ORAL
  Filled 2023-06-28: qty 1

## 2023-06-28 MED ORDER — SODIUM CHLORIDE 0.9 % IV SOLN: INTRAVENOUS | Status: AC | PRN

## 2023-06-28 MED ORDER — ALBUTEROL SULFATE (2.5 MG/3ML) 0.083% IN NEBU: 2.5000 mg | INHALATION_SOLUTION | Freq: Once | RESPIRATORY_TRACT | Status: DC | PRN

## 2023-06-28 MED ORDER — SODIUM CHLORIDE 0.9 % IV SOLN
500.0000 mg | Freq: Once | INTRAVENOUS | Status: AC
  Administered 2023-06-28: 500 mg via INTRAVENOUS
  Filled 2023-06-28: qty 25

## 2023-06-28 MED ORDER — EPINEPHRINE 0.3 MG/0.3ML IJ SOAJ: 0.3000 mg | Freq: Once | INTRAMUSCULAR | Status: DC | PRN

## 2023-06-28 MED ORDER — METHYLPREDNISOLONE SODIUM SUCC 125 MG IJ SOLR: 125.0000 mg | Freq: Once | INTRAMUSCULAR | Status: DC | PRN

## 2023-06-28 NOTE — Progress Notes (Signed)
 POSTPARTUM PROGRESS NOTE  POD #1  Subjective:  April James is a 33 y.o. [id number] s/p rLTCS at [redacted]w[redacted]d. No acute events overnight. She reports she is doing well. She denies any problems with ambulating or po intake. She denies voiding or a bowel movement since delivery. Denies nausea or vomiting. She has not passed flatus yet. Pain is well controlled.  Lochia is normal.  Objective: Blood pressure (!) 102/57, pulse 66, temperature 98.3 F (36.8 C), temperature source Oral, resp. rate 18, height 5\' 1"  (1.549 m), weight 105.7 kg, last menstrual period 10/10/2022, SpO2 99%, unknown if currently breastfeeding.  Physical Exam:  General: alert, cooperative and no distress Chest: no respiratory distress Heart: regular rate, distal pulses intact Uterine Fundus: firm, appropriately tender DVT Evaluation: No calf swelling or tenderness Extremities: trace bilateral edema Skin: warm, dry; incision clean/dry/intact w/ honeycomb dressing in place  Recent Labs    06/27/23 1326 06/28/23 0440  HGB 10.3* 8.5*  HCT 30.4* 24.6*    Assessment/Plan: April James is a 33 y.o. [id number] s/p rLTCS at [redacted]w[redacted]d for vaginal bleeding in the setting of known placenta previa.  POD#1 - Doing well; pain is well controlled. H/H appropriate  Routine postpartum care  OOB, ambulated  Lovenox  for VTE prophylaxis Acute Blood Loss Anemia: asymptomatic, CBC on 06/28/23 RBC 2.69, Hgb 8.5  Venofer infusion today  Start po ferrous sulfate  every other day  Contraception: Paragaurd IUD placed Feeding: Bottle  GDMA: Most recent glucose 89, continue to monitor  GHTN vs cHTN: Most recent BP 102/57, serum creatinine 0.54  Continue procardia  30 mg BID  Dispo: Plan for discharge 06/29/23 if meeting all goals.   LOS: 1 day   Park Bolk, Medical Student 06/28/2023, 7:57 AM  GME ATTESTATION:  Evaluation and management procedures were performed by the Medical Student under my supervision. I was immediately available for  direct supervision, assistance and direction throughout this encounter.  I also confirm that I have verified the information documented in the resident's note, and that I have also personally reperformed the pertinent components of the physical exam and all of the medical decision making activities.  I have also made any necessary editorial changes.  Darrow End, MD OB Fellow, Faculty Practice [facility], Center for [facility] 06/28/2023 8:14 PM

## 2023-06-28 NOTE — Clinical Social Work Maternal (Signed)
 CLINICAL SOCIAL WORK MATERNAL/CHILD NOTE  Patient Details  Name: April James MRN: [id number] Date of Birth: 09-03-1990  Date:  06/28/2023  Clinical Social Worker Initiating Note:  Jenney Modest Date/Time: Initiated:  06/28/23/1229     Child's Name:  April James   Biological Parents:  Mother, Father (April James 1991[birth year]05[birth year]27  April James 12-01-1998)   Need for Interpreter:  None   Reason for Referral:  Behavioral Health Concerns, Current Substance Use/Substance Use During Pregnancy     Address:  [address] [place] [address]    Phone number:  [phone] (home)     Additional phone number:   Household Members/Support Persons (HM/SP):   Household Member/Support Person 1, Household Member/Support Person 2, Household Member/Support Person 3   HM/SP Name Relationship DOB or Age  HM/SP -1 April James FOB 12-01-1998  HM/SP -2 April James 12-20-2008  HM/SP -3 April James 05-25-22  HM/SP -4        HM/SP -5        HM/SP -6        HM/SP -7        HM/SP -8          Natural Supports (not living in the home):  Spouse/significant other, Immediate Family, Parent, Extended Family   Professional Supports: None   Employment: Unemployed   Type of Work:     Education:  Engineer, agricultural   Homebound arranged:    Surveyor, quantity Resources:  [employer]   Other Resources:  Sales executive     Cultural/Religious Considerations Which May Impact Care:    Strengths:  Ability to meet basic needs  , Understanding of illness, Home prepared for child     Psychotropic Medications:         Pediatrician:       Pediatrician List: Estate manager/land agent has given a Barrister's clerk    [facility]      Pediatrician Fax Number:    Risk Factors/Current Problems:  Substance Use  , Mental Health Concerns     Cognitive State:  Able to Concentrate  , Alert  , Linear Thinking  , Insightful  ,  Goal Oriented     Mood/Affect:  Calm  , Comfortable  , Interested  , Relaxed     CSW Assessment: CSW received a consult for drug exposed newborn and hx of Bipolar and Anxiety. CSW met MOB at bedside to complete a full psychosocial assessment and offer support. CSW entered the room, introduced herself and acknowledged that her family was present. CSW asked MOB for privacy could her family step out for the assessment; MOB was agreeable and her guest stepped out. CSW explained her role and the reason for the visit.  MOB was polite, easy to engage, receptive to meeting with CSW, and appeared forthcoming.  CSW collected MOB's demographic information and inquired about her mental health history. MOB reported being diagnosed with Bipolar at the age of 33 years old. 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 MOB reported currently prescribed Vistaril  and  she plans to restart therapy with [facility] once she is discharged from the hospital for additional support.  MOB reported coping strategies which included reading, walking and meditating. CSW provided education regarding the baby blues period vs. perinatal mood disorders, discussed treatment and gave resources for mental  health follow up if concerns arise.  CSW recommends self-evaluation during the postpartum time period using the New Mom Checklist from Postpartum Progress and encouraged MOB to contact a medical professional if symptoms are noted at any time. CSW assessed for safety with MOB SI/HI/DV;MOB denied all.  CSW asked MOB does she receive support resources; MOB said yes(food stamps).  MOB reported having all essential items for the infant including a carseat, bassinet and crib for safe sleeping.CSW provided review of Sudden Infant Death Syndrome (SIDS) precautions.   CSW informed MOB due to [facility] during her pregnancy; the hospital will perform a UDS and CDS on the infant. If the screenings return with  positive results a report to CPS will be made; MOB was understanding. MOB reported using THC "gummies" that were purchased at a smoke shop and says that is only THC she has consumed. MOB reported her last use of the "gummies" was last month due to sickness, pain and persistent nausea. MOB reported she began the use of the "gummies" 2 month before finding out about her pregnancy and denied use of any other substances.   CSW will complete a CPS report due to the infant's UDS positive for THC.  CSW will continue to monitor the CDS and make a CPS report if warranted.  CSW Plan/Description:    No Further Intervention Required/No Barriers to Discharge, Sudden Infant Death Syndrome (SIDS) Education, Perinatal Mood and Anxiety Disorder (PMADs) Education, Hospital Drug Screen Policy Information, Other Information/Referral to [facility], CSW Will Continue to Monitor Umbilical Cord Tissue Drug Screen Results and Make Report if April James 06/28/2023, 12:33 PM

## 2023-06-28 NOTE — Progress Notes (Signed)
 Epidural catheter in place, verified with L&D nurse S. Suszanne Eriksson that there is no surgery scheduled for patient where epidural is needed. L&D nurse stated ok to pull it out. Catheter out at 0240 with blue tip visible.

## 2023-06-29 ENCOUNTER — Other Ambulatory Visit (HOSPITAL_COMMUNITY): Payer: Self-pay

## 2023-06-29 LAB — SURGICAL PATHOLOGY

## 2023-06-29 MED ORDER — OXYCODONE HCL 5 MG PO TABS
5.0000 mg | ORAL_TABLET | Freq: Four times a day (QID) | ORAL | 0 refills | Status: DC | PRN
  Filled 2023-06-29: qty 10, 3d supply, fill #0

## 2023-06-29 MED ORDER — POTASSIUM CHLORIDE CRYS ER 20 MEQ PO TBCR
20.0000 meq | EXTENDED_RELEASE_TABLET | Freq: Every day | ORAL | 0 refills | Status: DC
  Filled 2023-06-29: qty 5, 5d supply, fill #0

## 2023-06-29 MED ORDER — IBUPROFEN 600 MG PO TABS
600.0000 mg | ORAL_TABLET | Freq: Four times a day (QID) | ORAL | 0 refills | Status: AC
  Filled 2023-06-29: qty 30, 8d supply, fill #0

## 2023-06-29 MED ORDER — NIFEDIPINE ER 30 MG PO TB24
30.0000 mg | ORAL_TABLET | Freq: Every day | ORAL | 0 refills | Status: AC
  Filled 2023-06-29: qty 30, 30d supply, fill #0

## 2023-06-29 MED ORDER — SENNOSIDES-DOCUSATE SODIUM 8.6-50 MG PO TABS
2.0000 | ORAL_TABLET | Freq: Every day | ORAL | 0 refills | Status: AC
  Filled 2023-06-29: qty 60, 30d supply, fill #0

## 2023-06-29 MED ORDER — FUROSEMIDE 20 MG PO TABS
20.0000 mg | ORAL_TABLET | Freq: Every day | ORAL | 0 refills | Status: DC
  Filled 2023-06-29: qty 5, 5d supply, fill #0

## 2023-06-29 MED ORDER — FERROUS SULFATE 325 (65 FE) MG PO TABS
325.0000 mg | ORAL_TABLET | ORAL | 3 refills | Status: AC
  Filled 2023-06-29: qty 30, 60d supply, fill #0

## 2023-06-29 MED ORDER — FERROUS SULFATE 325 (65 FE) MG PO TABS
325.0000 mg | ORAL_TABLET | ORAL | Status: DC
  Administered 2023-06-29: 325 mg via ORAL
  Filled 2023-06-29: qty 1

## 2023-06-29 NOTE — Progress Notes (Signed)
 POSTPARTUM PROGRESS NOTE  POD #2  Subjective:  [place] Galicia is a 33 y.o. [phone] s/p primary LTCS at [redacted]w[redacted]d. No acute events overnight. She reports she is doing well. She denies any problems with ambulating, voiding or po intake. Denies nausea or vomiting. She has passed flatus. Pain is well controlled.  Lochia is equivalent to a period.  Objective: Blood pressure 136/73, pulse 74, temperature 98 F (36.7 C), temperature source Oral, resp. rate 18, height 5\' 1"  (1.549 m), weight 105.7 kg, last menstrual period 10/10/2022, SpO2 95%, unknown if currently breastfeeding.  Physical Exam:  General: alert, cooperative and no distress Chest: no respiratory distress Heart: regular rate, distal pulses intact Uterine Fundus: firm, appropriately tender DVT Evaluation: No calf swelling or tenderness Extremities: No edema Skin: warm, dry; incision clean/dry/intact w/ honeycomb dressing in place  Recent Labs    06/27/23 1326 06/28/23 0440  HGB 10.3* 8.5*  HCT 30.4* 24.6*    Assessment/Plan: [place] Costen is a 33 y.o. [id number] s/p pLTCS at [redacted]w[redacted]d for previa.  POD#2 - Doing welll; pain well controlled. H/H appropriate  Routine postpartum care  OOB, ambulated  Lovenox  for VTE prophylaxis  Acute Post-operative Blood Loss Anemia: asymptomatic  Start po ferrous sulfate  every other day  Chronic vs gestational hypertension: Cont Procardia , Lasix   BP f/up outpatient  A1GDM: Fasting CBG ok  needs 6 wk GTT  Contraception: Paragard placed Feeding: Nexplanon  Dispo: Plan for discharge today.   LOS: 2 days   Melanie Spires, MD OB Fellow  06/29/2023, 7:32 AM

## 2023-06-29 NOTE — Progress Notes (Signed)
 Patient unable to get Babyscripts. Patient instructed to take her blood pressure twice a day and call if elevated >/= 135/85. Patient verbalized understanding.

## 2023-07-01 LAB — TYPE AND SCREEN
ABO/RH(D): A POS
Antibody Screen: NEGATIVE
Unit division: 0
Unit division: 0

## 2023-07-01 LAB — BPAM RBC
Blood Product Expiration Date: 202506232359
Blood Product Expiration Date: 202506232359
ISSUE DATE / TIME: 202505271900
ISSUE DATE / TIME: 202505271900
Unit Type and Rh: 6200
Unit Type and Rh: 6200

## 2023-07-03 ENCOUNTER — Ambulatory Visit

## 2023-07-04 ENCOUNTER — Encounter: Admitting: Obstetrics and Gynecology

## 2023-07-04 ENCOUNTER — Ambulatory Visit

## 2023-07-04 VITALS — BP 150/89 | HR 73 | Wt 225.0 lb

## 2023-07-04 DIAGNOSIS — Z0131 Encounter for examination of blood pressure with abnormal findings: Secondary | ICD-10-CM

## 2023-07-04 DIAGNOSIS — O163 Unspecified maternal hypertension, third trimester: Secondary | ICD-10-CM

## 2023-07-04 DIAGNOSIS — Z98891 History of uterine scar from previous surgery: Secondary | ICD-10-CM

## 2023-07-04 NOTE — Progress Notes (Signed)
 Subjective:  April James is a 33 y.o. female here for BP check and incision check. Notes mild headache, no swelling , no visual changes.pt states she was told to decrease B/P Rx to 30mg .  Hypertension ROS: taking medications as instructed, no medication side effects noted, no TIA's, no chest pain on exertion, no dyspnea on exertion, and no swelling of ankles.   Pt reports incision healing well  Objective:  LMP 10/10/2022 (Approximate)   Appearance alert, well appearing, and in no distress. Incision healing well   Assessment:   Blood Pressure today in office needs improvement.  Incision healed nicely, no signs of infection, scar looks good  Plan:  Consulted with provider in office pt needs to increase to 60mg  of Nifedipine  12hrs apart. Return in 1 wk for repeat B/P check.Aaron Aas

## 2023-07-10 ENCOUNTER — Ambulatory Visit

## 2023-07-11 ENCOUNTER — Ambulatory Visit

## 2023-07-11 ENCOUNTER — Encounter: Admitting: Family Medicine

## 2023-07-12 ENCOUNTER — Ambulatory Visit (INDEPENDENT_AMBULATORY_CARE_PROVIDER_SITE_OTHER): Admitting: *Deleted

## 2023-07-12 VITALS — BP 124/82 | HR 67

## 2023-07-12 DIAGNOSIS — O165 Unspecified maternal hypertension, complicating the puerperium: Secondary | ICD-10-CM

## 2023-07-12 NOTE — Progress Notes (Signed)
 Subjective:  April James is a 33 y.o. female here for BP check.   Hypertension ROS: Patient denies any headaches, visual symptoms, RUQ/epigastric pain or other concerning symptoms.  Objective:  BP 124/82   Pulse 67   LMP 10/10/2022 (Approximate)   Appearance alert, well appearing, and in no distress. General exam BP noted to be improved today in office.    Assessment:   Blood Pressure improved.   Plan:  At postpartum visit and or as needed.   Bettey Browning, RN

## 2023-07-18 ENCOUNTER — Encounter: Admitting: Obstetrics and Gynecology

## 2023-07-25 ENCOUNTER — Encounter: Admitting: Family Medicine

## 2023-08-08 ENCOUNTER — Ambulatory Visit: Admitting: Obstetrics and Gynecology

## 2023-08-10 ENCOUNTER — Ambulatory Visit: Admitting: Family Medicine

## 2023-08-10 VITALS — BP 137/82 | HR 82 | Wt 218.0 lb

## 2023-08-10 DIAGNOSIS — Z720 Tobacco use: Secondary | ICD-10-CM

## 2023-08-10 DIAGNOSIS — F419 Anxiety disorder, unspecified: Secondary | ICD-10-CM | POA: Diagnosis not present

## 2023-08-10 DIAGNOSIS — Z8632 Personal history of gestational diabetes: Secondary | ICD-10-CM | POA: Diagnosis not present

## 2023-08-10 DIAGNOSIS — Z8759 Personal history of other complications of pregnancy, childbirth and the puerperium: Secondary | ICD-10-CM | POA: Diagnosis not present

## 2023-08-10 MED ORDER — BUSPIRONE HCL 7.5 MG PO TABS: 7.5000 mg | ORAL_TABLET | Freq: Three times a day (TID) | ORAL | 2 refills | Status: AC

## 2023-08-10 NOTE — Progress Notes (Signed)
 Post Partum Visit Note  April James is a 33 y.o. [phone] female who presents for a postpartum visit. She is 6 weeks postpartum following a primary cesarean section.  I have fully reviewed the prenatal and intrapartum course. The delivery was at 35 gestational weeks.  Anesthesia: epidural. Postpartum course has been uncomplicated. Baby is doing well. Baby is feeding by bottle - Similac Neosure. Bleeding moderate. Bowel function is abnormal: constipated taking stool softener . Bladder function is normal. Patient is not sexually active. Contraception method is IUD. Postpartum depression screening: Anxiety.   The pregnancy intention screening data noted above was reviewed. Potential methods of contraception were discussed. The patient elected to proceed with No data recorded.   Edinburgh Postnatal Depression Scale - 08/10/23 1618       Edinburgh Postnatal Depression Scale:  In the Past 7 Days   I have been able to laugh and see the funny side of things. 0    I have looked forward with enjoyment to things. 0    I have blamed myself unnecessarily when things went wrong. 2    I have been anxious or worried for no good reason. 2    I have felt scared or panicky for no good reason. 2    Things have been getting on top of me. 1    I have been so unhappy that I have had difficulty sleeping. 2    I have felt sad or miserable. 1    I have been so unhappy that I have been crying. 1    The thought of harming myself has occurred to me. 0    Edinburgh Postnatal Depression Scale Total 11          Health Maintenance Due  Topic Date Due   Hepatitis B Vaccines (1 of 3 - 19+ 3-dose series) Never done   HPV VACCINES (1 - 3-dose SCDM series) Never done   COVID-19 Vaccine (1 - 2024-25 season) Never done    The following portions of the patient's history were reviewed and updated as appropriate: allergies, current medications, past family history, past medical history, past social history, past  surgical history, and problem list.  Review of Systems Pertinent items noted in HPI and remainder of comprehensive ROS otherwise negative.  Objective:  BP (!) 146/102   Pulse 98   Wt 218 lb (98.9 kg)   LMP 10/10/2022 (Approximate)   BMI 41.19 kg/m    General:  alert, cooperative, and appears stated age   Breasts:  not indicated  Lungs: Normal effort  Heart:  regular rate and rhythm  Abdomen: soft, non-tender; bowel sounds normal; no masses,  no organomegaly   Wound well approximated incision  GU exam:  normal IUD strings noted       Assessment:   Normal postpartum exam.   Plan:   Essential components of care per ACOG recommendations:  1.  Mood and well being: Patient with positive depression screening today. Reviewed local resources for support. Mostly has anxiety, given Buspar  in addition to her hydroxyzine  - Patient tobacco use? Yes. Patient desires to quit? Yes.Discussed reduction and cessation and offered nicotine  replacement therapy which was accepted. Has nicotine  patch - hx of drug use? No.    2. Infant care and feeding:  -Patient currently breastmilk feeding? No.  -Social determinants of health (SDOH) reviewed in EPIC. No concerns  3. Sexuality, contraception and birth spacing - Patient does not want a pregnancy in the next year.  Desired family  size is 3 children.  - Reviewed reproductive life planning. Reviewed contraceptive methods based on pt preferences and effectiveness.  Patient desired IUD or IUS today.   - Discussed birth spacing of 18 months  4. Sleep and fatigue -Encouraged family/partner/community support of 4 hrs of uninterrupted sleep to help with mood and fatigue  5. Physical Recovery  - Discussed patients delivery and complications. She describes her labor as mixed. - Patient had a C-section, no problems at delivery.  Patient expressed understanding - Patient has urinary incontinence? No. - Patient is safe to resume physical and sexual  activity  6.  Health Maintenance - HM due items addressed Yes - Last pap smear  Diagnosis  Date Value Ref Range Status  01/13/2022   Final   - Negative for Intraepithelial Lesions or Malignancy (NILM)  01/13/2022 - Benign reactive/reparative changes  Final   Pap smear not done at today's visit.  -Breast Cancer screening indicated? No.   7. Chronic Disease/Pregnancy Condition follow up: Hypertension and Gestational Diabetes Continue Procardia  2 hour gtt is needed. To f/u with psych for talk therapy and meds for bipolar. - PCP follow up  Glenys GORMAN Birk, MD [facility], [facility]

## 2023-08-17 ENCOUNTER — Other Ambulatory Visit

## 2023-08-17 ENCOUNTER — Ambulatory Visit

## 2023-10-19 ENCOUNTER — Telehealth

## 2023-12-19 DIAGNOSIS — F900 Attention-deficit hyperactivity disorder, predominantly inattentive type: Secondary | ICD-10-CM | POA: Diagnosis not present

## 2023-12-19 DIAGNOSIS — F17210 Nicotine dependence, cigarettes, uncomplicated: Secondary | ICD-10-CM | POA: Diagnosis not present

## 2023-12-19 DIAGNOSIS — F3162 Bipolar disorder, current episode mixed, moderate: Secondary | ICD-10-CM | POA: Diagnosis not present

## 2024-01-02 DIAGNOSIS — F17210 Nicotine dependence, cigarettes, uncomplicated: Secondary | ICD-10-CM | POA: Diagnosis not present

## 2024-01-02 DIAGNOSIS — F3162 Bipolar disorder, current episode mixed, moderate: Secondary | ICD-10-CM | POA: Diagnosis not present

## 2024-01-02 DIAGNOSIS — F900 Attention-deficit hyperactivity disorder, predominantly inattentive type: Secondary | ICD-10-CM | POA: Diagnosis not present

## 2024-01-02 DIAGNOSIS — F418 Other specified anxiety disorders: Secondary | ICD-10-CM | POA: Diagnosis not present

## 2024-01-18 ENCOUNTER — Telehealth (HOSPITAL_COMMUNITY): Payer: Self-pay | Admitting: Licensed Clinical Social Worker

## 2024-01-18 NOTE — Telephone Encounter (Signed)
 The therapist receives a voicemail from April James who indicates that she is trying to get connected to a program as she tested positive for [facility] with DSS.  The therapist returns her call leaving a HIPAA-compliant voicemail.  Zell Maier, MA, LCSW, [facility], LCAS 01/18/2024

## 2024-01-22 DIAGNOSIS — F909 Attention-deficit hyperactivity disorder, unspecified type: Secondary | ICD-10-CM | POA: Diagnosis not present

## 2024-02-01 ENCOUNTER — Other Ambulatory Visit: Payer: Self-pay | Admitting: Family Medicine

## 2024-02-01 DIAGNOSIS — F419 Anxiety disorder, unspecified: Secondary | ICD-10-CM
